# Patient Record
Sex: Female | Born: 1935
Health system: Southern US, Community
[De-identification: ages and names within clinical notes are randomized; demographics above are authoritative.]

## PROBLEM LIST (undated history)

## (undated) DIAGNOSIS — E785 Hyperlipidemia, unspecified: Secondary | ICD-10-CM

## (undated) DIAGNOSIS — R002 Palpitations: Secondary | ICD-10-CM

## (undated) DIAGNOSIS — M810 Age-related osteoporosis without current pathological fracture: Secondary | ICD-10-CM

## (undated) DIAGNOSIS — F419 Anxiety disorder, unspecified: Secondary | ICD-10-CM

## (undated) HISTORY — DX: Hyperlipidemia, unspecified: E78.5

## (undated) HISTORY — PX: OOPHORECTOMY: SHX86

## (undated) HISTORY — DX: Palpitations: R00.2

## (undated) HISTORY — DX: Age-related osteoporosis without current pathological fracture: M81.0

## (undated) HISTORY — PX: EYE SURGERY: SHX253

## (undated) HISTORY — PX: CYSTECTOMY: SUR359

## (undated) HISTORY — DX: Anxiety disorder, unspecified: F41.9

---

## 2002-03-25 ENCOUNTER — Encounter: Payer: Self-pay | Admitting: Gastroenterology

## 2006-05-30 ENCOUNTER — Encounter: Payer: Self-pay | Admitting: Cardiology

## 2009-01-08 ENCOUNTER — Encounter: Payer: Self-pay | Admitting: Cardiology

## 2009-03-04 ENCOUNTER — Encounter: Payer: Self-pay | Admitting: Cardiology

## 2009-07-02 ENCOUNTER — Encounter: Payer: Self-pay | Admitting: Cardiology

## 2009-08-04 ENCOUNTER — Ambulatory Visit: Payer: Self-pay | Admitting: Cardiology

## 2010-01-03 ENCOUNTER — Encounter: Payer: Self-pay | Admitting: Gastroenterology

## 2010-01-05 ENCOUNTER — Encounter: Payer: Self-pay | Admitting: Gastroenterology

## 2010-01-28 DIAGNOSIS — E785 Hyperlipidemia, unspecified: Secondary | ICD-10-CM

## 2010-02-02 ENCOUNTER — Ambulatory Visit: Payer: Self-pay | Admitting: Cardiology

## 2010-02-02 DIAGNOSIS — I080 Rheumatic disorders of both mitral and aortic valves: Secondary | ICD-10-CM

## 2010-02-02 DIAGNOSIS — R002 Palpitations: Secondary | ICD-10-CM | POA: Insufficient documentation

## 2010-05-30 ENCOUNTER — Encounter: Payer: Self-pay | Admitting: Gastroenterology

## 2010-06-08 ENCOUNTER — Encounter: Payer: Self-pay | Admitting: Gastroenterology

## 2010-06-09 ENCOUNTER — Encounter (INDEPENDENT_AMBULATORY_CARE_PROVIDER_SITE_OTHER): Payer: Self-pay | Admitting: *Deleted

## 2010-06-09 ENCOUNTER — Telehealth: Payer: Self-pay | Admitting: Gastroenterology

## 2010-06-09 DIAGNOSIS — R110 Nausea: Secondary | ICD-10-CM | POA: Insufficient documentation

## 2010-06-10 ENCOUNTER — Ambulatory Visit (HOSPITAL_COMMUNITY)
Admission: RE | Admit: 2010-06-10 | Discharge: 2010-06-10 | Payer: Self-pay | Source: Home / Self Care | Admitting: Gastroenterology

## 2010-06-13 ENCOUNTER — Ambulatory Visit: Payer: Self-pay | Admitting: Gastroenterology

## 2010-06-15 ENCOUNTER — Encounter: Payer: Self-pay | Admitting: Gastroenterology

## 2010-06-30 ENCOUNTER — Telehealth: Payer: Self-pay | Admitting: Gastroenterology

## 2010-07-01 ENCOUNTER — Encounter: Admission: RE | Admit: 2010-07-01 | Discharge: 2010-07-01 | Payer: Self-pay | Admitting: Family Medicine

## 2010-07-08 ENCOUNTER — Encounter: Payer: Self-pay | Admitting: Internal Medicine

## 2010-07-09 ENCOUNTER — Encounter: Payer: Self-pay | Admitting: Internal Medicine

## 2010-07-13 ENCOUNTER — Ambulatory Visit: Payer: Self-pay | Admitting: Internal Medicine

## 2010-07-13 DIAGNOSIS — I959 Hypotension, unspecified: Secondary | ICD-10-CM | POA: Insufficient documentation

## 2010-07-27 ENCOUNTER — Ambulatory Visit: Payer: Self-pay | Admitting: Cardiology

## 2010-07-28 ENCOUNTER — Telehealth: Payer: Self-pay | Admitting: Cardiology

## 2010-08-03 ENCOUNTER — Ambulatory Visit: Payer: Self-pay | Admitting: Cardiology

## 2011-01-17 NOTE — Procedures (Signed)
Summary: [facility] Report  Life [facility] Report   Imported By: Erle Crocker 07/22/2010 11:26:14  _____________________________________________________________________  External Attachment:    Type:   Image     Comment:   External Document

## 2011-01-17 NOTE — Letter (Signed)
Summary: Patient [facility] Biopsy Results  [facility]  [address] [place], [place] [address]   Phone: [phone]  Fax: [phone]        June 15, 2010 MRN: [id number]    [facility] [address] [place], [place]  [address]    Dear [name],  I am pleased to inform you that the biopsies taken during your recent endoscopic examination did not show any evidence of cancer upon pathologic examination.  Additional information/recommendations:  __No further action is needed at this time.  Please follow-up with      your primary care physician for your other healthcare needs.  __ Please call [phone] to schedule a return visit to review      your condition.  _X_ Continue with the treatment plan as outlined on the day of your      exam.  __ You should have a repeat endoscopic examination for this problem              in _ months/years.   Please call us if you are having persistent problems or have questions about your condition that have not been fully answered at this time.  Sincerely,  Mardella Layman MD [facility]  This letter has been electronically signed by your physician.  Appended Document: Patient Notice-Endo Biopsy Results letter mailed 7.6.11

## 2011-01-17 NOTE — Procedures (Signed)
Summary: [facility]  [facility]   Imported By: Sherian Rein 06/24/2010 14:10:56  _____________________________________________________________________  External Attachment:    Type:   Image     Comment:   External Document

## 2011-01-17 NOTE — Assessment & Plan Note (Signed)
Summary:  Cardiology   Visit Type:  Follow-up Primary Provider:  Dr. Christell Constant  CC:  palpitations.  History of Present Illness: The H. and presents for followup. Since I last saw her she has had rare palpitations. These are controllable with diltiazem and occasional Xanax. She has had no presyncope or syncope. She has had no chest discomfort, neck or arm discomfort. She denies any shortness of breath, PND or orthopnea. She walks 4 miles daily.   Current Medications (verified): 1)  Diltiazem Hcl 60 Mg Tabs (Diltiazem Hcl) .... 1/2 By Mouth Two Times A Day 2)  Alprazolam 0.25 Mg Tabs (Alprazolam) .... 1/2 By Mouth As Needed 3)  Unisom Sleepgels 50 Mg Caps (Diphenhydramine Hcl (Sleep)) .Marland Kitchen.. 1 By Mouth At Bedtime  Allergies (verified): 1)  ! Penicillin 2)  ! Keflex  Past History:  Past Medical History: Mild hyperlipidemia.  Palpitations  Past Surgical History: Breast cyst resected Oopherectomy Eye surgery.   Review of Systems       As stated in the HPI and negative for all other systems.   Vital Signs:  Patient profile:   76 year old female Height:      64 inches Weight:      118 pounds BMI:     20.33 Pulse rate:   63 / minute Resp:     16 per minute BP sitting:   94 / 56  (right arm)  Vitals Entered By: Marrion Coy, CNA (February 02, 2010 1:02 PM)  Physical Exam  General:  Well developed, well nourished, in no acute distress. Head:  normocephalic and atraumatic Eyes:  PERRLA/EOM intact; conjunctiva and lids normal. Mouth:  Teeth, gums and palate normal. Oral mucosa normal. Neck:  Neck supple, no JVD. No masses, thyromegaly or abnormal cervical nodes. Chest Wall:  no deformities or breast masses noted Lungs:  Clear bilaterally to auscultation and percussion. Heart:  Non-displaced PMI, chest non-tender; regular rate and rhythm, S1, S2 without murmurs, rubs or gallops. Carotid upstroke normal, no bruit. Normal abdominal aortic size, no bruits. Femorals normal  pulses, no bruits. Pedals normal pulses. No edema, no varicosities. Abdomen:  Bowel sounds positive; abdomen soft and non-tender without masses, organomegaly, or hernias noted. No hepatosplenomegaly. Msk:  Back normal, normal gait. Muscle strength and tone normal. Pulses:  pulses normal in all 4 extremities Extremities:  No clubbing or cyanosis. Neurologic:  Alert and oriented x 3. Skin:  Intact without lesions or rashes. Psych:  hyperactive.     EKG  Procedure date:  02/02/2010  Findings:      sinus rhythm, rate 63, axis within normal limits, intervals within normal limits, no acute ST-T wave changes.  Impression & Recommendations:  Problem # 1:  PALPITATIONS (ICD-785.1) These are not particularly problematic at this point. She will continue with the therapies as prescribed. No further workup is suggested.  Orders: EKG w/ Interpretation (93000)  Problem # 2:  HYPERLIPIDEMIA, MILD (ICD-272.4) Per Dr. Christell Constant.  Problem # 3:  MITRAL REGURGITATION (ICD-396.3) The patient had trivial mitral regurgitation on a previous echo. She was concerned about this. I reassured her that this was very unlikely to become clinically hemodynamically significant in the future.  Patient Instructions: 1)  Your physician recommends that you schedule a follow-up appointment in: 6  MONTHS 2)  Your physician recommends that you continue on your current medications as directed. Please refer to the Current Medication list given to you today. 3)  You have been diagnosed with palpitations. Palpitations are described as a  gradual or sudden awareness of the beating of your heart. It may last seconds, minutes, hours, or days and may be caused by the heart beating slower, faster, more strongly, or more irregularly than normal. They are very common and usually not dangerous. Please see the handout/brochure given to you today for more information. Prescriptions: DILTIAZEM HCL 60 MG TABS (DILTIAZEM HCL) 1/2 by mouth two  times a day  #30 x 11   Entered by:   Charolotte Capuchin, RN   Authorized by:   Rollene Rotunda, MD, [facility]   Signed by:   Charolotte Capuchin, RN on 02/02/2010   Method used:   Faxed to ...       [facility]       [address]       [place], [place]  [address]       Ph: [phone] or [phone]       Fax: [phone]   RxID:   [id number]

## 2011-01-17 NOTE — Procedures (Signed)
Summary: [facility]   [facility]   Imported By: Cala Bradford Mesiemore 08/17/2010 10:16:08  _____________________________________________________________________  External Attachment:    Type:   Image     Comment:   External Document

## 2011-01-17 NOTE — Procedures (Signed)
Summary: Upper Endoscopy  Patient: Rose Martinez Note: All result statuses are Final unless otherwise noted.  Tests: (1) Upper Endoscopy (EGD)   EGD Upper Endoscopy       DONE     [facility]     [address]     [place], [place]  [address]           ENDOSCOPY PROCEDURE REPORT           PATIENT:  Rose Martinez, Rose Martinez  MR#:  [id number]     BIRTHDATE:  October 17, 1935, 74 yrs. old  GENDER:  female           ENDOSCOPIST:  Vania Rea. Jarold Motto, MD, [facility]     Referred by:  Rudi Heap, M.D.           PROCEDURE DATE:  06/13/2010     PROCEDURE:  EGD with biopsy     ASA CLASS:  Class II     INDICATIONS:  nausea, weight loss           MEDICATIONS:   Fentanyl 25 mcg IV, Versed 4 mg IV     TOPICAL ANESTHETIC:           DESCRIPTION OF PROCEDURE:   After the risks benefits and     alternatives of the procedure were thoroughly explained, informed     consent was obtained.  The LB GIF-H180 [id number] endoscope was     introduced through the mouth and advanced to the second portion of     the duodenum, without limitations.  The instrument was slowly     withdrawn as the mucosa was fully examined.     <<PROCEDUREIMAGES>>           Normal GE junction was noted.  Normal duodenal folds were noted.     small bowel bx. done  The stomach was entered and closely     examined. The antrum, angularis, and lesser curvature were well     visualized, including a retroflexed view of the cardia and fundus.     The stomach wall was normally distensable. The scope passed easily     through the pylorus into the duodenum. CLO BX DONE FOR H.PYLORI     EXAM.  The esophagus and gastroesophageal junction were completely     normal in appearance.    Retroflexed views revealed no     abnormalities.    The scope was then withdrawn from the patient     and the procedure completed.           COMPLICATIONS:  None           ENDOSCOPIC IMPRESSION:     1) Normal GE junction     2) Normal duodenal folds     3) Normal  stomach     4) Normal esophagus     RECOMMENDATIONS:     1) Rx CLO if positive     2) continue current medications     COLONOSCOPY FOLLOWUP RECOMMENDED.           REPEAT EXAM:  No           ______________________________     Vania Rea. Jarold Motto, MD, [facility]           CC:           n.     eSIGNED:   Vania Rea. Patterson at 06/13/2010 11:25 AM           Renetta Chalk, [phone]  Note:  An exclamation mark (!) indicates a result that was not dispersed into the flowsheet. Document Creation Date: 06/13/2010 11:26 AM _______________________________________________________________________  (1) Order result status: Final Collection or observation date-time: 06/13/2010 11:19 Requested date-time:  Receipt date-time:  Reported date-time:  Referring Physician:   Ordering Physician: Sheryn Bison [phone]) Specimen Source:  Source: Launa Grill Order Number: [id number] Lab site:

## 2011-01-17 NOTE — Miscellaneous (Signed)
Summary: clotest  Clinical Lists Changes  Orders: Added new Test order of TLB-H Pylori Screen Gastric Biopsy (83013-CLOTEST) - Signed 

## 2011-01-17 NOTE — Progress Notes (Signed)
Summary: Triage  Phone Note From Other Clinic   Summary of Call: Dr. Jarold Motto talked with Dr. Christell Constant.  Pt needs to be scheduled for Ultrasound on 06/10/10 and EGD on 06/13/10.  Pt having nausea.  Phone number [phone] Initial call taken by: Ashok Cordia RN,  June 09, 2010 8:40 AM  Follow-up for Phone Call        [place] scheduled for 06/10/10 at 9:00 at [facility].  NPO 6 hrs.  LM for pt to call.  Lupita Leash Surface RN  June 09, 2010 10:43 AM  LM for pt to call.  Lupita Leash Surface RN  June 09, 2010 2:28 PM  Pt notified of appt for [place] on 06/10/10.  Pt sch for EGD on 06/13/10.  She will stop by for instructions in am. Follow-up by: Ashok Cordia RN,  June 09, 2010 3:35 PM  New Problems: NAUSEA (ICD-787.02)   New Problems: NAUSEA (ICD-787.02)

## 2011-01-17 NOTE — Progress Notes (Signed)
Summary: Colonoscopy needed  Phone Note Outgoing Call   Call placed by: Ashok Cordia RN,  June 30, 2010 4:42 PM Summary of Call: Per endoscopy report.  Pt needs to sch colonoscopy.  Tried to call pt.  No answer. Initial call taken by: Ashok Cordia RN,  June 30, 2010 4:42 PM  Follow-up for Phone Call        Called pt re colon.  Female answered phone.  States pt is out of town and he does not know when she will return.  Letter sent asking pt to call and sch Colon as suggested by Dr. Jarold Motto.  (flag sent as a reminder to check on colon) Follow-up by: Ashok Cordia RN,  July 04, 2010 11:21 AM

## 2011-01-17 NOTE — Procedures (Signed)
Summary: Summary Report  Summary Report   Imported By: Erle Crocker 08/05/2010 13:15:11  _____________________________________________________________________  External Attachment:    Type:   Image     Comment:   External Document

## 2011-01-17 NOTE — Letter (Signed)
Summary: EGD Instructions  [facility]  [address] [place], [place] [address]   Phone: [phone]  Fax: [phone]       Rose Martinez    05-16-1935    MRN: [id number]       Procedure Day /Date: Monday, 06/13/10     Arrival Time:  10:00     Procedure Time: 11:00     Location of Procedure:                    [facility] (4th Floor)    PREPARATION FOR ENDOSCOPY   On 06/13/10 THE DAY OF THE PROCEDURE:  1.   No solid foods, milk or milk products are allowed after midnight the night before your procedure.  2.   Do not drink anything colored red or purple.  Avoid juices with pulp.  No orange juice.  3.  You may drink clear liquids until 9:00, which is 2 hours before your procedure.                                                                                                CLEAR LIQUIDS INCLUDE: Water Jello Ice Popsicles Tea (sugar ok, no milk/cream) Powdered fruit flavored drinks Coffee (sugar ok, no milk/cream) Gatorade Juice: apple, white grape, white cranberry  Lemonade Clear bullion, consomm, broth Carbonated beverages (any kind) Strained chicken noodle soup Hard Candy   MEDICATION INSTRUCTIONS  Unless otherwise instructed, you should take regular prescription medications with a small sip of water as early as possible the morning of your procedure.                      OTHER INSTRUCTIONS  You will need a responsible adult at least 74 years of age to accompany you and drive you home.   This person must remain in the waiting room during your procedure.  Wear loose fitting clothing that is easily removed.  Leave jewelry and other valuables at home.  However, you may wish to bring a book to read or an iPod/MP3 player to listen to music as you wait for your procedure to start.  Remove all body piercing jewelry and leave at home.  Total time from sign-in until discharge is approximately 2-3 hours.  You should  go home directly after your procedure and rest.  You can resume normal activities the day after your procedure.  The day of your procedure you should not:   Drive   Make legal decisions   Operate machinery   Drink alcohol   Return to work  You will receive specific instructions about eating, activities and medications before you leave.    The above instructions have been reviewed and explained to me by   _______________________    I fully understand and can verbalize these instructions _____________________________ Date _________

## 2011-01-17 NOTE — Letter (Signed)
[facility]: [facility]   Imported By: Sherian Rein 06/24/2010 14:12:13  _____________________________________________________________________  External Attachment:    Type:   Image     Comment:   External Document

## 2011-01-17 NOTE — Assessment & Plan Note (Signed)
Summary: [facility]   Visit Type:  Follow-up Primary Provider:  Dr. Christell Constant  CC:  Palpitatoins.  History of Present Illness: The patient presents for evaluation of palpitations. She was seen as an add-on by Dr.Bensimhon for complaints of lightheadedness. There was some slightly labile blood pressure. However, it was ultimately thought that her symptoms were related to anxiety. She has since had improvement with less stress though she always appears to be very high strung when I see her. She has palpitations but these are chronic and not particularly bothersome. She's not having any chest pressure, neck or arm discomfort. She is having no new shortness of breath, PND or orthopnea.  Current Medications (verified): 1)  Alprazolam 0.25 Mg Tabs (Alprazolam) .... 1/2 By Mouth As Needed 2)  Unisom Sleepgels 50 Mg Caps (Diphenhydramine Hcl (Sleep)) .... 1/2 Tablet As Needed For Sleep.  Rarely Taking  Allergies (verified): 1)  ! Penicillin 2)  ! Keflex  Past History:  Past Medical History: Reviewed history from 07/13/2010 and no changes required. Mild hyperlipidemia.  Palpitations  --normal echo with trivial  Past Surgical History: Reviewed history from 02/02/2010 and no changes required. Breast cyst resected Oopherectomy Eye surgery.   Review of Systems       As stated in the HPI and negative for all other systems.   Vital Signs:  Patient profile:   76 year old female Height:      64 inches Weight:      113 pounds BMI:     19.47 Pulse rate:   66 / minute Resp:     16 per minute BP sitting:   108 / 60  (right arm)  Vitals Entered By: Marrion Coy, CNA (August 03, 2010 11:46 AM)   Physical Exam  General:  Well developed, well nourished, in no acute distress. Head:  normocephalic and atraumatic Eyes:  PERRLA/EOM intact; conjunctiva and lids normal. Mouth:  Teeth, gums and palate normal. Oral mucosa normal. Neck:  Neck supple, no JVD. No masses, thyromegaly or  abnormal cervical nodes. Chest Wall:  no deformities or breast masses noted Lungs:  Clear bilaterally to auscultation and percussion. Heart:  Non-displaced PMI, chest non-tender; regular rate and rhythm, S1, S2 without murmurs, rubs or gallops. Carotid upstroke normal, no bruit. Normal abdominal aortic size, no bruits. Femorals normal pulses, no bruits. Pedals normal pulses. No edema, no varicosities. Abdomen:  Bowel sounds positive; abdomen soft and non-tender without masses, organomegaly, or hernias noted. No hepatosplenomegaly. Msk:  Back normal, normal gait. Muscle strength and tone normal. Pulses:  pulses normal in all 4 extremities Extremities:  No clubbing or cyanosis. Neurologic:  Alert and oriented x 3. Skin:  Intact without lesions or rashes. Psych:  hyperactive.     Impression & Recommendations:  Problem # 1:  HYPOTENSION, UNSPECIFIED (ICD-458.9) She is not having any new lightheadedness, presyncope or syncope. No change in therapy is indicated.  Problem # 2:  PALPITATIONS (ICD-785.1) Tare baseline and not particularly symptomatic. No change in therapy is planned. I agree that stress plays a role in some of her symptoms.  Patient Instructions: 1)  Your physician recommends that you schedule a follow-up appointment in: 6 months 2)  Your physician recommends that you continue on your current medications as directed. Please refer to the Current Medication list given to you today.

## 2011-01-17 NOTE — Progress Notes (Signed)
Summary: would like to be seen on 8/17 vs 9/7  Phone Note Call from Patient Call back at [facility] Phone [phone]   Caller: Patient Reason for Call: Talk to Nurse Details for Reason: pt didn't relized she had appt on 8/10 wi th jh. her records shows 8/17. pt made appt for 9/7, but would like to be seen on 8/17. please call after 1p.m. today .  Initial call taken by: Lorne Skeens,  July 28, 2010 9:13 AM  Follow-up for Phone Call        appt given for 08/03/2010 at 11:30 Follow-up by: Charolotte Capuchin, RN,  July 28, 2010 9:49 AM

## 2011-01-17 NOTE — Assessment & Plan Note (Signed)
Summary: rov   Primary Provider:  Dr. Christell Constant  CC:  rov. pt sent here by Dr. Christell Constant due to low blood pressure and lightheadedness.  She also report weak legs and unsteady gait.  No appetite here recently.  History of Present Illness: Rose Martinez is a 76 y/o woman with h/u anxiety and palpitations/symptomatic PACs with normal echo. Followed by Dr. Antoine Poche. Referred today by Dr. Christell Constant for acute visit for check on low BP and dizziness.   For past month she has felt lightheaded, nauseated and legs are weak.  No energy.  Anorexic and has lost 10 pounds. Says she is typically very active and walks 3 miles per day but she hasn't been able to do this. Has had extensive work-up with Dr. Christell Constant including head CT, EGD, abdominal CT, event monitor and ambulatory BP monitor. Has also had extensive amount of blood work and saw Dr. Vickey Huger in neurology.  Says she is exasperated. Feels something is really wrong. Had ambulatory BP cuff and BP has ranged 82/39 to 118/62. She is very concerned about her low BP. Still having palpitations. Monitor showed SR with sinus arrhtymia and PACs. Drinks lots of fluids.   Currently going through a divorce which she says is a "happy divorce" and only regret is she didn't do it 40 years ago. Saw Dr. Christell Constant again recently who suggested Cymbalta but she hasn't started yet.   Problems Prior to Update: 1)  Nausea  (ICD-787.02) 2)  Mitral Regurgitation  (ICD-396.3) 3)  Palpitations  (ICD-785.1) 4)  Hyperlipidemia, Mild  (ICD-272.4)  Medications Prior to Update: 1)  Diltiazem Hcl 60 Mg Tabs (Diltiazem Hcl) .... 1/2 By Mouth Two Times A Day 2)  Alprazolam 0.25 Mg Tabs (Alprazolam) .... 1/2 By Mouth As Needed 3)  Unisom Sleepgels 50 Mg Caps (Diphenhydramine Hcl (Sleep)) .Marland Kitchen.. 1 By Mouth At Bedtime  Current Medications (verified): 1)  Alprazolam 0.25 Mg Tabs (Alprazolam) .... 1/2 By Mouth As Needed 2)  Unisom Sleepgels 50 Mg Caps (Diphenhydramine Hcl (Sleep)) .... 1/2 Tablet As Needed  For Sleep.  Rarely Taking  Allergies (verified): 1)  ! Penicillin 2)  ! Keflex  Past History:  Past Surgical History: Last updated: 02/02/2010 Breast cyst resected Oopherectomy Eye surgery.   Family History: Last updated: 01/28/2010  Family history is contributory for father dying of myocardial infarction   suddenly at age 33, otherwise as stated in the HPI.   Social History: Last updated: 07/13/2010  The patient is married. She has one child and two   grandchildren, who live in [place].  She has never really smoked   cigarettes.  She does not drink alcohol.   Past Medical History: Mild hyperlipidemia.  Palpitations  --normal echo with trivial PSH reviewed for relevance  Family History: Reviewed history from 01/28/2010 and no changes required.  Family history is contributory for father dying of myocardial infarction   suddenly at age 20, otherwise as stated in the HPI.   Social History: Reviewed history from 01/28/2010 and no changes required.  The patient is married. She has one child and two   grandchildren, who live in [place].  She has never really smoked   cigarettes.  She does not drink alcohol.   Review of Systems       As per HPI and past medical history; otherwise all systems negative.   Vital Signs:  Patient profile:   76 year old female Height:      64 inches Weight:      116  pounds BMI:     19.98 Pulse rate:   74 / minute Pulse rhythm:   regular BP supine:   98 / 54 BP sitting:   92 / 56  (right arm)  Vitals Entered By: Judithe Modest CMA (July 13, 2010 2:48 PM)  Physical Exam  General:  Gen: well appearing. no resp difficulty. +pressured speech HEENT: normal Neck: supple. no JVD. Carotids 2+ bilat; no bruits. No lymphadenopathy or thryomegaly appreciated. Cor: PMI nondisplaced. Regular rate & rhythm. No rubs, gallops, murmur. Lungs: clear Abdomen: soft, nontender, nondistended. No hepatosplenomegaly. No bruits or masses. Good bowel  sounds. Extremities: no cyanosis, clubbing, rash, edema Neuro: alert & orientedx3, cranial nerves grossly intact. moves all 4 extremities w/o difficulty. affect pleasant    Impression & Recommendations:  Problem # 1:  Weakness We had a very long discussion about her constellation of symptoms and I said to her that I suspect the main problem is likely situational depression. I agreed with Dr. Kathi Der suggestion to start Cymbalta. I also suggested possible counseling.   Problem # 2:  HYPOTENSION, UNSPECIFIED (ICD-458.9) BP is on low side but unchanged from previous. Not significantly orthostatic. Encouraged continued fluid intake. Would not start midodrine or florinef at this time.   Problem # 3:  PALPITATIONS (ICD-785.1) Stable. Monitor with PACs and sinus arrhytmia with occasional brief pause < 2secs. No further w/u needed.   Patient Instructions: 1)  Your physician recommends that you schedule a follow-up appointment as scheduled 2)  Your physician recommends that you continue on your current medications as directed. Please refer to the Current Medication list given to you today.  Prevention & Chronic Care Immunizations   Influenza vaccine: Not documented    Tetanus booster: Not documented    Pneumococcal vaccine: Not documented    H. zoster vaccine: Not documented  Colorectal Screening   Hemoccult: Not documented    Colonoscopy: Not documented  Other Screening   Pap smear: Not documented    Mammogram: Not documented    DXA bone density scan: Not documented   Smoking status: Not documented  Lipids   Total Cholesterol: Not documented   LDL: Not documented   LDL Direct: Not documented   HDL: Not documented   Triglycerides: Not documented    SGOT (AST): Not documented   SGPT (ALT): Not documented   Alkaline phosphatase: Not documented   Total bilirubin: Not documented  Self-Management Support :    Lipid self-management support: Not documented

## 2011-02-15 ENCOUNTER — Encounter: Payer: Self-pay | Admitting: Cardiology

## 2011-02-15 ENCOUNTER — Ambulatory Visit (INDEPENDENT_AMBULATORY_CARE_PROVIDER_SITE_OTHER): Payer: Medicare Other | Admitting: Cardiology

## 2011-02-15 DIAGNOSIS — R002 Palpitations: Secondary | ICD-10-CM

## 2011-02-23 NOTE — Assessment & Plan Note (Signed)
Summary: FOLLOW UP - 6 MONTHS/per pt call=mj   Visit Type:  Follow-up Primary Provider:  Dr. Christell Constant  CC:  Palpitaitons.  History of Present Illness: The patient presents for evaluation of palpiations.  Since I last saw year her she has palpitations daily.  However, she is learning to live with it.  She has had no presyncope or syncope. She has had no chest pressure, neck or arm discomfort she has had no shortness of breath, PND or orthopnea. She is exercising routinely. She has had none of the problem of low blood pressure which he had previously.  Current Medications (verified): 1)  Alprazolam 0.25 Mg Tabs (Alprazolam) .... 1/2 By Mouth As Needed 2)  Unisom Sleepgels 50 Mg Caps (Diphenhydramine Hcl (Sleep)) .... 1/2 Tablet As Needed For Sleep.  Rarely Taking  Allergies (verified): 1)  ! Penicillin 2)  ! Keflex  Past History:  Past Medical History: Mild hyperlipidemia.  Palpitations    Past Surgical History: Reviewed history from 02/02/2010 and no changes required. Breast cyst resected Oopherectomy Eye surgery.   Review of Systems       As stated in the HPI and negative for all other systems.   Vital Signs:  Patient profile:   76 year old female Height:      64 inches Weight:      118 pounds BMI:     20.33 Pulse rate:   54 / minute Resp:     16 per minute BP sitting:   98 / 62  (right arm)  Vitals Entered By: Marrion Coy, CNA (February 15, 2011 2:24 PM)  Physical Exam  General:  Well developed, well nourished, in no acute distress. Head:  normocephalic and atraumatic Neck:  Neck supple, no JVD. No masses, thyromegaly or abnormal cervical nodes. Chest Wall:  no deformities or breast masses noted Lungs:  Clear bilaterally to auscultation and percussion. Heart:  Non-displaced PMI, chest non-tender; regular rate and rhythm, S1, S2 without murmurs, rubs or gallops. Carotid upstroke normal, no bruit. Normal abdominal aortic size, no bruits. Femorals normal pulses, no  bruits. Pedals normal pulses. No edema, no varicosities. Abdomen:  Bowel sounds positive; abdomen soft and non-tender without masses, organomegaly, or hernias noted. No hepatosplenomegaly. Msk:  Back normal, normal gait. Muscle strength and tone normal. Extremities:  No clubbing or cyanosis. Neurologic:  Alert and oriented x 3. Skin:  Intact without lesions or rashes. Psych:  hyperactive.     Impression & Recommendations:  Problem # 1:  PALPITATIONS (ICD-785.1) She has no increased symptoms. Therefore, no change in therapy or further evaluation is indicated.  Problem # 2:  HYPOTENSION, UNSPECIFIED (ICD-458.9) She is not bothered by low blood pressure. I will make no change to her regimen.  Problem # 3:  MITRAL REGURGITATION (ICD-396.3) This was trivial and needs no followup.  Patient Instructions: 1)  Your physician recommends that you schedule a follow-up appointment in: 6 months with Dr Antoine Poche in [place] 2)  Your physician recommends that you continue on your current medications as directed. Please refer to the Current Medication list given to you today.

## 2011-05-02 NOTE — Assessment & Plan Note (Signed)
[facility]                            CARDIOLOGY OFFICE NOTE   ANIESA, BOBACK                         MRN:          [id number]  DATE:08/04/2009                            DOB:          May 02, 1935    PRIMARY CARE PHYSICIAN:  Ernestina Penna, MD   REASON FOR PRESENTATION:  Evaluate the patient with premature atrial  contractions.   HISTORY OF PRESENT ILLNESS:  The patient is 76 years old.  She lives in  [place], but spends most of her time in [place].  In [place], she has  been cared for for years by her primary care physician and a  cardiologist.  She has symptomatic premature atrial contractions.  I was  able to review the notes from her cardiology practice.  She had been  treated predominately with diltiazem over the years.  She says, she has  been intolerant to multiple medications.  I see that Rythmol has been  suggested, but she never started this.  She says, she does relatively  well with the calcium channel blocker.  She will get the palpitations  however, daily.  She describes symptomatic extra beats.  She does not  describe sustained tachyarrhythmias.  She does not have presyncope or  syncope.  She denies any chest pressure, neck or arm discomfort.  She  has had studies to include an echocardiogram in 2008, which demonstrated  no significant cardiac abnormalities.  She had mention of a stress  perfusion study in 2003-2004 that apparently was normal.  She also  reports that she has had treadmill tests routinely and does well with  these.  She does say that she has some dyslipidemia that is managed  conservatively as she has wanted to avoid medications.  Since she is  going to be staying more in [place], she wants to establish with a  cardiologist here as well as a physician in [place].   PAST MEDICAL HISTORY:  Mild hyperlipidemia.   PAST SURGICAL HISTORY:  Breast cyst resected, ovaries removed, eye  surgery.   ALLERGIES:   Questionably to PENICILLIN and KEFLEX.   MEDICATIONS:  1. Diltiazem 30 mg b.i.d.  2. Xanax 0.125 mg as needed.   SOCIAL HISTORY:  The patient is married.  She has one child and two  grandchildren, who live in [place].  She has never really smoked  cigarettes.  She does not drink alcohol.   Family history is contributory for father dying of myocardial infarction  suddenly at age 87, otherwise as stated in the HPI.   REVIEW OF SYSTEMS:  Positive for recent dizziness (she is due to see an  ENT doctor), otherwise negative for all other systems.   PHYSICAL EXAMINATION:  GENERAL:  The patient is pleasant and in no  distress.  VITAL SIGNS:  Blood pressure 110/66, heart rate 67 and regular, weight  124 pounds, body mass index 21.  HEENT:  Eyelids unremarkable.  Pupils equal, round, and reactive to  light.  Fundi not visualized.  Oral mucosa unremarkable.  NECK:  No jugular venous distention at 45 degrees.  Carotid upstroke  brisk and symmetric.  No bruits.  No thyromegaly.  LYMPHATICS:  No cervical, axillary, or inguinal adenopathy.  LUNGS:  Clear to auscultation bilaterally.  BACK:  No costovertebral angle tenderness.  CHEST:  Unremarkable.  HEART:  PMI not displaced or sustained, S1 and S2 within normal limits.  No S3, no S4, no clicks, no rubs, no murmurs.  ABDOMEN:  Flat, positive bowel sounds, normal in frequency and pitch, no  bruits, no rebound, no guarding, no midline pulsatile mass, no  hepatomegaly, no splenomegaly.  SKIN:  No rashes, no nodules.  EXTREMITIES:  2+ pulses throughout, no edema, no cyanosis, no clubbing.  NEUROLOGIC:  Oriented to person, place, and time.  Cranial nerves II  through XII grossly intact.  Motor grossly intact.   EKG:  Sinus bradycardia, rate 70, low voltage in the limb leads, axis  within normal limits, intervals within normal limits, no acute ST-wave  changes.   ASSESSMENT AND PLAN:  1. Premature atrial contractions:  The patient has  symptomatic      premature atrial contractions, but prefers the most conservative      therapy.  At this point, no further change to her regimen is      indicated.  We discussed this at length.  She will let me know if      she has any increasing symptoms going forward.  2. Dyslipidemia:  She would like me to review her lipid profile.  She      would like to avoid statins if possible.  In the absence of any      known obstructive coronary disease, the threshold will be      relatively high to start these though she does have a family      history as her significant risk factor.  I will be happy to review      these when they are available.  3. Mitral regurgitation:  She was concerned about mitral      regurgitation.  Her previous echo demonstrates very trivial mitral      regurgitation and she has no murmur.  No further workup is      suggested.  4. Anxiety:  She continues on Xanax and management per her primary      care doctors.   FOLLOWUP:  She wants to be seen about every 6 months.  We will decide  whether that is here or in [place] or combination most likely.     Rollene Rotunda, MD, [facility]  Electronically Signed    JH/MedQ  DD: 08/04/2009  DT: 08/05/2009  Job #: [id number]   cc:   Ernestina Penna, M.D.

## 2011-07-25 ENCOUNTER — Encounter: Payer: Self-pay | Admitting: Cardiology

## 2011-08-15 ENCOUNTER — Encounter: Payer: Self-pay | Admitting: Cardiology

## 2011-08-16 ENCOUNTER — Encounter: Payer: Self-pay | Admitting: Cardiology

## 2011-08-16 ENCOUNTER — Ambulatory Visit (INDEPENDENT_AMBULATORY_CARE_PROVIDER_SITE_OTHER): Payer: Medicare Other | Admitting: Cardiology

## 2011-08-16 DIAGNOSIS — I080 Rheumatic disorders of both mitral and aortic valves: Secondary | ICD-10-CM

## 2011-08-16 DIAGNOSIS — R002 Palpitations: Secondary | ICD-10-CM

## 2011-08-16 DIAGNOSIS — Z8249 Family history of ischemic heart disease and other diseases of the circulatory system: Secondary | ICD-10-CM

## 2011-08-16 DIAGNOSIS — Z299 Encounter for prophylactic measures, unspecified: Secondary | ICD-10-CM

## 2011-08-16 DIAGNOSIS — R079 Chest pain, unspecified: Secondary | ICD-10-CM

## 2011-08-16 DIAGNOSIS — I959 Hypotension, unspecified: Secondary | ICD-10-CM

## 2011-08-16 NOTE — Assessment & Plan Note (Signed)
This was trivial in the past.  No change in therapy is indicated.  No echo is needed.

## 2011-08-16 NOTE — Assessment & Plan Note (Signed)
These are unchanged.  We talked about them again today and no change in therapy or further testing is indicated.

## 2011-08-16 NOTE — Assessment & Plan Note (Signed)
She would like to have a screening test for CAD and she asked about CT coronary calcium scoring.  I think this would be an excellent test.  I will order this.

## 2011-08-16 NOTE — Progress Notes (Signed)
HPI  Allergies  Allergen Reactions  . Cephalexin   . Penicillins     Current Outpatient Prescriptions  Medication Sig Dispense Refill  . ALPRAZolam (XANAX) 0.25 MG tablet Take 0.125 mg by mouth as needed.        . trimethoprim (TRIMPEX) 100 MG tablet Take 100 mg by mouth as needed.          Past Medical History  Diagnosis Date  . Hyperlipidemia     Mild  . Palpitations     Past Surgical History  Procedure Date  . Cystectomy     Breast  . Oophorectomy   . Eye surgery     ROS: PHYSICAL EXAM BP 88/64  Pulse 56  Resp 16  Ht 5\' 4"  (1.626 m)  Wt 116 lb (52.617 kg)  BMI 19.91 kg/m2 GENERAL:  Well appearing HEENT:  Pupils equal round and reactive, fundi not visualized, oral mucosa unremarkable NECK:  No jugular venous distention, waveform within normal limits, carotid upstroke brisk and symmetric, no bruits, no thyromegaly LYMPHATICS:  No cervical, inguinal adenopathy LUNGS:  Clear to auscultation bilaterally BACK:  No CVA tenderness CHEST:  Unremarkable HEART:  PMI not displaced or sustained,S1 and S2 within normal limits, no S3, no S4, no clicks, no rubs, no murmurs ABD:  Flat, positive bowel sounds normal in frequency in pitch, no bruits, no rebound, no guarding, no midline pulsatile mass, no hepatomegaly, no splenomegaly EXT:  2 plus pulses throughout, no edema, no cyanosis no clubbing SKIN:  No rashes no nodules NEURO:  Cranial nerves II through XII grossly intact, motor grossly intact throughout PSYCH:  Cognitively intact, oriented to person place and time  EKG:  Sinus rhythm, rate 56, axis within normal limits, intervals within normal limits, no acute ST-T wave changes.  ASSESSMENT AND PLAN

## 2011-08-16 NOTE — Assessment & Plan Note (Signed)
She has had no syncope or presyncope.  No change in therapy is indicated.

## 2011-08-16 NOTE — Patient Instructions (Signed)
The current medical regimen is effective;  continue present plan and medications.  Dr Antoine Poche has ordered for you to have a  Cornonary Calcium Score.  We you be contact to schedule this procedure.

## 2011-08-17 DIAGNOSIS — R079 Chest pain, unspecified: Secondary | ICD-10-CM | POA: Insufficient documentation

## 2011-08-30 ENCOUNTER — Encounter: Payer: Self-pay | Admitting: Cardiology

## 2011-08-31 ENCOUNTER — Encounter: Payer: Self-pay | Admitting: Cardiology

## 2011-09-04 ENCOUNTER — Other Ambulatory Visit (HOSPITAL_COMMUNITY): Payer: Medicare Other

## 2012-02-21 ENCOUNTER — Telehealth: Payer: Self-pay | Admitting: Cardiology

## 2012-02-21 NOTE — Telephone Encounter (Signed)
Close  

## 2012-02-28 DIAGNOSIS — R109 Unspecified abdominal pain: Secondary | ICD-10-CM | POA: Diagnosis not present

## 2012-03-04 ENCOUNTER — Encounter: Payer: Self-pay | Admitting: Cardiology

## 2012-03-04 DIAGNOSIS — H5200 Hypermetropia, unspecified eye: Secondary | ICD-10-CM | POA: Diagnosis not present

## 2012-03-04 DIAGNOSIS — R5381 Other malaise: Secondary | ICD-10-CM | POA: Diagnosis not present

## 2012-03-04 DIAGNOSIS — E785 Hyperlipidemia, unspecified: Secondary | ICD-10-CM | POA: Diagnosis not present

## 2012-03-04 DIAGNOSIS — R7989 Other specified abnormal findings of blood chemistry: Secondary | ICD-10-CM | POA: Diagnosis not present

## 2012-03-04 DIAGNOSIS — H2510 Age-related nuclear cataract, unspecified eye: Secondary | ICD-10-CM | POA: Diagnosis not present

## 2012-03-04 DIAGNOSIS — H47099 Other disorders of optic nerve, not elsewhere classified, unspecified eye: Secondary | ICD-10-CM | POA: Diagnosis not present

## 2012-03-04 DIAGNOSIS — H524 Presbyopia: Secondary | ICD-10-CM | POA: Diagnosis not present

## 2012-03-04 DIAGNOSIS — E559 Vitamin D deficiency, unspecified: Secondary | ICD-10-CM | POA: Diagnosis not present

## 2012-03-25 DIAGNOSIS — H35039 Hypertensive retinopathy, unspecified eye: Secondary | ICD-10-CM | POA: Diagnosis not present

## 2012-03-25 DIAGNOSIS — H04129 Dry eye syndrome of unspecified lacrimal gland: Secondary | ICD-10-CM | POA: Diagnosis not present

## 2012-03-25 DIAGNOSIS — H4720 Unspecified optic atrophy: Secondary | ICD-10-CM | POA: Diagnosis not present

## 2012-03-25 DIAGNOSIS — H2510 Age-related nuclear cataract, unspecified eye: Secondary | ICD-10-CM | POA: Diagnosis not present

## 2012-03-27 ENCOUNTER — Encounter: Payer: Self-pay | Admitting: Cardiology

## 2012-03-27 ENCOUNTER — Ambulatory Visit (INDEPENDENT_AMBULATORY_CARE_PROVIDER_SITE_OTHER): Payer: Medicare Other | Admitting: Cardiology

## 2012-03-27 DIAGNOSIS — E785 Hyperlipidemia, unspecified: Secondary | ICD-10-CM

## 2012-03-27 DIAGNOSIS — Z299 Encounter for prophylactic measures, unspecified: Secondary | ICD-10-CM

## 2012-03-27 DIAGNOSIS — R079 Chest pain, unspecified: Secondary | ICD-10-CM | POA: Diagnosis not present

## 2012-03-27 DIAGNOSIS — R002 Palpitations: Secondary | ICD-10-CM

## 2012-03-27 NOTE — Assessment & Plan Note (Signed)
She is having no new symptoms.  No change in therapy is indicated. 

## 2012-03-27 NOTE — Assessment & Plan Note (Signed)
Per Dr. Moore:

## 2012-03-27 NOTE — Patient Instructions (Signed)
The current medical regimen is effective;  continue present plan and medications.  Your physician has requested that you have cardiac calcium score. Cardiac computed tomography (CT) is a painless test that uses an x-ray machine to take clear, detailed pictures of your heart. For further information please visit [email or web address] Please follow instruction sheet as given.  Follow up in 6 months with Dr Antoine Poche.  You will receive a letter in the mail 2 months before you are due.  Please call us when you receive this letter to schedule your follow up appointment.

## 2012-03-27 NOTE — Progress Notes (Signed)
   HPI The patient presents for followup of palpitations. At the last visit we tried to arrange a coronary calcium score but we were unable to do this through the hospital.  She is still interested in having this done. She is also being treated for dyslipidemia but did not tolerate 5 mg of Crestor. She is about to start 2.5 mg daily per Dr. Christell Constant. She continues to have some palpitations but these are not changed. had no presyncope or syncope. She's she is walking 3 miles daily without limitations.  The patient denies any new symptoms such as chest discomfort, neck or arm discomfort. There has been no new shortness of breath, PND or orthopnea. There has been no reported presyncope or syncope.  Allergies  Allergen Reactions  . Cephalexin   . Penicillins     Current Outpatient Prescriptions  Medication Sig Dispense Refill  . ALPRAZolam (XANAX) 0.25 MG tablet Take 0.125 mg by mouth as needed.        . trimethoprim (TRIMPEX) 100 MG tablet Take 100 mg by mouth as needed.          Past Medical History  Diagnosis Date  . Hyperlipidemia     Mild  . Palpitations     Past Surgical History  Procedure Date  . Cystectomy     Breast  . Oophorectomy   . Eye surgery     ROS:  As stated in the HPI and negative for all other systems.   PHYSICAL EXAM BP 110/80  Pulse 51  Ht 5\' 4"  (1.626 m)  Wt 117 lb (53.071 kg)  BMI 20.08 kg/m2 GENERAL:  Well appearing, thin HEENT:  Pupils equal round and reactive, fundi not visualized, oral mucosa unremarkable NECK:  No jugular venous distention, waveform within normal limits, carotid upstroke brisk and symmetric, no bruits, no thyromegaly LYMPHATICS:  No cervical, inguinal adenopathy LUNGS:  Clear to auscultation bilaterally BACK:  No CVA tenderness CHEST:  Unremarkable HEART:  PMI not displaced or sustained,S1 and S2 within normal limits, no S3, no S4, no clicks, no rubs, no murmurs ABD:  Flat, positive bowel sounds normal in frequency in pitch, no  bruits, no rebound, no guarding, no midline pulsatile mass, no hepatomegaly, no splenomegaly EXT:  2 plus pulses throughout, no edema, no cyanosis no clubbing  EKG:  Sinus rhythm, rate 51, axis within normal limits, intervals within normal limits, no acute ST-T wave changes.  03/27/2012   ASSESSMENT AND PLAN

## 2012-03-27 NOTE — Assessment & Plan Note (Signed)
She is still interested in a coronary calcium score and I will arrange this our office.

## 2012-04-02 DIAGNOSIS — D235 Other benign neoplasm of skin of trunk: Secondary | ICD-10-CM | POA: Diagnosis not present

## 2012-04-02 DIAGNOSIS — L608 Other nail disorders: Secondary | ICD-10-CM | POA: Diagnosis not present

## 2012-04-11 DIAGNOSIS — Z1231 Encounter for screening mammogram for malignant neoplasm of breast: Secondary | ICD-10-CM | POA: Diagnosis not present

## 2012-04-17 DIAGNOSIS — M899 Disorder of bone, unspecified: Secondary | ICD-10-CM | POA: Diagnosis not present

## 2012-04-17 DIAGNOSIS — M949 Disorder of cartilage, unspecified: Secondary | ICD-10-CM | POA: Diagnosis not present

## 2012-05-21 DIAGNOSIS — N3000 Acute cystitis without hematuria: Secondary | ICD-10-CM | POA: Diagnosis not present

## 2012-05-22 ENCOUNTER — Ambulatory Visit (INDEPENDENT_AMBULATORY_CARE_PROVIDER_SITE_OTHER)
Admission: RE | Admit: 2012-05-22 | Discharge: 2012-05-22 | Disposition: A | Discharge status: Home / Self Care | Payer: Medicare Other | Source: Ambulatory Visit | Attending: Cardiology | Admitting: Cardiology

## 2012-05-22 DIAGNOSIS — R002 Palpitations: Secondary | ICD-10-CM

## 2012-07-18 DIAGNOSIS — H60399 Other infective otitis externa, unspecified ear: Secondary | ICD-10-CM | POA: Diagnosis not present

## 2012-07-18 DIAGNOSIS — H6120 Impacted cerumen, unspecified ear: Secondary | ICD-10-CM | POA: Diagnosis not present

## 2012-08-26 DIAGNOSIS — E785 Hyperlipidemia, unspecified: Secondary | ICD-10-CM | POA: Diagnosis not present

## 2012-08-26 DIAGNOSIS — J441 Chronic obstructive pulmonary disease with (acute) exacerbation: Secondary | ICD-10-CM | POA: Diagnosis not present

## 2012-08-28 DIAGNOSIS — R5381 Other malaise: Secondary | ICD-10-CM | POA: Diagnosis not present

## 2012-08-28 DIAGNOSIS — E785 Hyperlipidemia, unspecified: Secondary | ICD-10-CM | POA: Diagnosis not present

## 2012-08-28 DIAGNOSIS — R5383 Other fatigue: Secondary | ICD-10-CM | POA: Diagnosis not present

## 2012-08-28 DIAGNOSIS — E559 Vitamin D deficiency, unspecified: Secondary | ICD-10-CM | POA: Diagnosis not present

## 2012-08-28 DIAGNOSIS — Z79899 Other long term (current) drug therapy: Secondary | ICD-10-CM | POA: Diagnosis not present

## 2012-09-27 DIAGNOSIS — Z124 Encounter for screening for malignant neoplasm of cervix: Secondary | ICD-10-CM | POA: Diagnosis not present

## 2012-09-27 DIAGNOSIS — Z01419 Encounter for gynecological examination (general) (routine) without abnormal findings: Secondary | ICD-10-CM | POA: Diagnosis not present

## 2012-10-21 DIAGNOSIS — D235 Other benign neoplasm of skin of trunk: Secondary | ICD-10-CM | POA: Diagnosis not present

## 2012-11-28 DIAGNOSIS — H2510 Age-related nuclear cataract, unspecified eye: Secondary | ICD-10-CM | POA: Diagnosis not present

## 2013-02-06 DIAGNOSIS — G518 Other disorders of facial nerve: Secondary | ICD-10-CM | POA: Diagnosis not present

## 2013-02-24 ENCOUNTER — Encounter: Payer: Self-pay | Admitting: Cardiology

## 2013-02-24 DIAGNOSIS — E785 Hyperlipidemia, unspecified: Secondary | ICD-10-CM | POA: Diagnosis not present

## 2013-02-24 DIAGNOSIS — E559 Vitamin D deficiency, unspecified: Secondary | ICD-10-CM | POA: Diagnosis not present

## 2013-02-24 DIAGNOSIS — Z0000 Encounter for general adult medical examination without abnormal findings: Secondary | ICD-10-CM | POA: Diagnosis not present

## 2013-02-24 DIAGNOSIS — R002 Palpitations: Secondary | ICD-10-CM | POA: Diagnosis not present

## 2013-02-24 DIAGNOSIS — I10 Essential (primary) hypertension: Secondary | ICD-10-CM | POA: Diagnosis not present

## 2013-02-24 DIAGNOSIS — E039 Hypothyroidism, unspecified: Secondary | ICD-10-CM | POA: Diagnosis not present

## 2013-03-05 ENCOUNTER — Ambulatory Visit (INDEPENDENT_AMBULATORY_CARE_PROVIDER_SITE_OTHER): Payer: Medicare Other | Admitting: Cardiology

## 2013-03-05 ENCOUNTER — Encounter: Payer: Self-pay | Admitting: Cardiology

## 2013-03-05 VITALS — BP 108/62 | HR 60 | Ht 65.0 in | Wt 119.0 lb

## 2013-03-05 DIAGNOSIS — I959 Hypotension, unspecified: Secondary | ICD-10-CM

## 2013-03-05 DIAGNOSIS — R002 Palpitations: Secondary | ICD-10-CM | POA: Diagnosis not present

## 2013-03-05 DIAGNOSIS — I080 Rheumatic disorders of both mitral and aortic valves: Secondary | ICD-10-CM

## 2013-03-05 DIAGNOSIS — E785 Hyperlipidemia, unspecified: Secondary | ICD-10-CM | POA: Diagnosis not present

## 2013-03-05 NOTE — Patient Instructions (Addendum)
The current medical regimen is effective;  continue present plan and medications.  Follow up in 6 months with Dr Hochrein.  You will receive a letter in the mail 2 months before you are due.  Please call us when you receive this letter to schedule your follow up appointment.  

## 2013-03-05 NOTE — Progress Notes (Signed)
   HPI The patient presents for followup of palpitations. Since I last saw her she had a coronary calcium score which was zero.  She did have a spell of increasing palpitations but this was when she was drinking caffeinated beverages and he [place] peppermint patties at lunch. Since she stopped taking in caffeine her palpitations are much less frequent. She did have labs TSH and electrolytes. The patient denies any new symptoms such as chest discomfort, neck or arm discomfort. There has been no new shortness of breath, PND or orthopnea. There has been no presyncope or syncope.  Allergies  Allergen Reactions  . Cephalexin   . Penicillins     Current Outpatient Prescriptions  Medication Sig Dispense Refill  . ALPRAZolam (XANAX) 0.25 MG tablet Take 0.125 mg by mouth as needed.        . trimethoprim (TRIMPEX) 100 MG tablet Take 100 mg by mouth as needed.         No current facility-administered medications for this visit.    Past Medical History  Diagnosis Date  . Hyperlipidemia     Mild  . Palpitations     Past Surgical History  Procedure Laterality Date  . Cystectomy      Breast  . Oophorectomy    . Eye surgery      ROS:  As stated in the HPI and negative for all other systems.   PHYSICAL EXAM BP 108/62  Pulse 60  Ht 5\' 5"  (1.651 m)  Wt 119 lb (53.978 kg)  BMI 19.8 kg/m2 GENERAL:  Well appearing, thin NECK:  No jugular venous distention, waveform within normal limits, carotid upstroke brisk and symmetric, no bruits, no thyromegaly LYMPHATICS:  No cervical, inguinal adenopathy LUNGS:  Clear to auscultation bilaterally CHEST:  Unremarkable HEART:  PMI not displaced or sustained,S1 and S2 within normal limits, no S3, no S4, no clicks, no rubs, no murmurs ABD:  Flat, positive bowel sounds normal in frequency in pitch, no bruits, no rebound, no guarding, no midline pulsatile mass, no hepatomegaly, no splenomegaly EXT:  2 plus pulses throughout, no edema, no cyanosis no  clubbing  EKG:  Sinus rhythm, rate 51, axis within normal limits, intervals within normal limits, no acute ST-T wave changes.  03/05/2013  ASSESSMENT AND PLAN  CHEST PAIN:  She's not feeling any chest discomfort. She had a calcium score of 0.  No further testing is indicated.  PALPITATIONS:  She seems to be able to treat this with avoidance of caffeine. No further change in therapy or testing is indicated.

## 2013-03-19 ENCOUNTER — Ambulatory Visit: Payer: Medicare Other | Admitting: Cardiology

## 2013-04-10 ENCOUNTER — Telehealth: Payer: Self-pay | Admitting: Family Medicine

## 2013-04-11 ENCOUNTER — Telehealth: Payer: Self-pay | Admitting: *Deleted

## 2013-04-11 DIAGNOSIS — W57XXXA Bitten or stung by nonvenomous insect and other nonvenomous arthropods, initial encounter: Secondary | ICD-10-CM

## 2013-04-11 MED ORDER — DOXYCYCLINE HYCLATE 100 MG PO TABS: 100.0000 mg | ORAL_TABLET | Freq: Two times a day (BID) | ORAL | Status: DC

## 2013-04-11 NOTE — Telephone Encounter (Signed)
Patient removed what sounds like a deer tick 2 days ago.  She denies any rash or fever.  She is unsure of the length of time it had been attached. She is very concerned about contracting Lyme's Disease.  Discussed with Dr. Christell Constant and he authorized Doxycyline.  Patient aware that it is being sent to the pharmacy.  Made her aware that doxycycline can cause sensitivity to sunlight.  She will call back or schedule an appt if she has any problems or concerns.

## 2013-04-14 NOTE — Telephone Encounter (Signed)
Addressed in another telephone encounter.  Patient was prescribed doxycyline.

## 2013-04-21 DIAGNOSIS — D235 Other benign neoplasm of skin of trunk: Secondary | ICD-10-CM | POA: Diagnosis not present

## 2013-04-21 DIAGNOSIS — L708 Other acne: Secondary | ICD-10-CM | POA: Diagnosis not present

## 2013-04-21 DIAGNOSIS — L918 Other hypertrophic disorders of the skin: Secondary | ICD-10-CM | POA: Diagnosis not present

## 2013-04-21 DIAGNOSIS — L908 Other atrophic disorders of skin: Secondary | ICD-10-CM | POA: Diagnosis not present

## 2013-04-23 ENCOUNTER — Other Ambulatory Visit: Payer: Self-pay

## 2013-04-23 MED ORDER — ALPRAZOLAM 0.25 MG PO TABS: 0.1250 mg | ORAL_TABLET | ORAL | Status: DC | PRN

## 2013-04-23 NOTE — Telephone Encounter (Signed)
Last seen 02/24/13   If approved call in and have nurse notify patient

## 2013-04-24 NOTE — Telephone Encounter (Signed)
RX phoned into pharmacy Pt notified

## 2013-05-07 DIAGNOSIS — Z1231 Encounter for screening mammogram for malignant neoplasm of breast: Secondary | ICD-10-CM | POA: Diagnosis not present

## 2013-05-30 ENCOUNTER — Other Ambulatory Visit: Payer: Self-pay | Admitting: Family Medicine

## 2013-06-05 DIAGNOSIS — H6120 Impacted cerumen, unspecified ear: Secondary | ICD-10-CM | POA: Diagnosis not present

## 2013-07-22 DIAGNOSIS — N3000 Acute cystitis without hematuria: Secondary | ICD-10-CM | POA: Diagnosis not present

## 2013-08-26 ENCOUNTER — Ambulatory Visit: Payer: Self-pay | Admitting: Family Medicine

## 2013-09-03 ENCOUNTER — Encounter: Payer: Self-pay | Admitting: Family Medicine

## 2013-09-03 ENCOUNTER — Ambulatory Visit (INDEPENDENT_AMBULATORY_CARE_PROVIDER_SITE_OTHER): Payer: Medicare Other | Admitting: Family Medicine

## 2013-09-03 VITALS — BP 103/63 | HR 56 | Temp 97.4°F | Ht 65.0 in | Wt 118.0 lb

## 2013-09-03 DIAGNOSIS — E559 Vitamin D deficiency, unspecified: Secondary | ICD-10-CM | POA: Diagnosis not present

## 2013-09-03 DIAGNOSIS — E785 Hyperlipidemia, unspecified: Secondary | ICD-10-CM

## 2013-09-03 DIAGNOSIS — I080 Rheumatic disorders of both mitral and aortic valves: Secondary | ICD-10-CM | POA: Diagnosis not present

## 2013-09-03 DIAGNOSIS — I959 Hypotension, unspecified: Secondary | ICD-10-CM | POA: Diagnosis not present

## 2013-09-03 DIAGNOSIS — N390 Urinary tract infection, site not specified: Secondary | ICD-10-CM | POA: Insufficient documentation

## 2013-09-03 DIAGNOSIS — I8390 Asymptomatic varicose veins of unspecified lower extremity: Secondary | ICD-10-CM

## 2013-09-03 LAB — POCT UA - MICROSCOPIC ONLY
Bacteria, U Microscopic: NEGATIVE
Yeast, UA: NEGATIVE

## 2013-09-03 LAB — POCT URINALYSIS DIPSTICK
Glucose, UA: NEGATIVE
Nitrite, UA: NEGATIVE
Protein, UA: NEGATIVE
Urobilinogen, UA: NEGATIVE
pH, UA: 5

## 2013-09-03 NOTE — Progress Notes (Signed)
  Subjective:    Patient ID: Rose Martinez, female    DOB: 1935-05-17, 78 y.o.   MRN: [id number]  HPI Patient comes in today for followup and management of chronic medical problems. These problems include hyperlipidemia, osteopenia, anxiety, and palpitations. She sees the cardiologist for her palpitation problems and she also follows with a urologist for recurrent urinary tract infections.   Review of Systems  Constitutional: Negative.   HENT: Negative.   Eyes: Negative.   Respiratory: Negative.   Cardiovascular: Negative.   Gastrointestinal: Negative.   Endocrine: Negative.   Genitourinary: Negative.   Musculoskeletal: Negative.   Skin: Positive for color change.       verucose veins and knot behind right leg  Allergic/Immunologic: Negative.   Neurological: Negative.   Hematological: Negative.   Psychiatric/Behavioral: Negative.        Objective:   Physical Exam BP 103/63  Pulse 56  Temp(Src) 97.4 F (36.3 C) (Oral)  Ht 5\' 5"  (1.651 m)  Wt 118 lb (53.524 kg)  BMI 19.64 kg/m2  The patient appeared well nourished and normally developed, alert and oriented to time and place. Speech, behavior and judgement appear normal. Vital signs as documented.  Head exam is unremarkable. No scleral icterus or pallor noted. Ears nose and throat all within normal limits. Neck is without jugular venous distension, thyromegally, or carotid bruits. Carotid upstrokes are brisk bilaterally. No cervical adenopathy. Lungs are clear anteriorly and posteriorly to auscultation. Normal respiratory effort. Cardiac exam reveals regular rate and rhythm at 60 per minute. First and second heart sounds normal.  No murmurs, rubs or gallops.  Abdominal exam reveals normal bowl sounds, no masses, no organomegaly and no aortic enlargement. No inguinal adenopathy. There was no abdominal tenderness. Extremities are nonedematous and both femoral and pedal pulses are normal. She has prominent varicosities in  the right posterior calf. There was some slight tenderness with flexion of toes of right foot. Skin without pallor or jaundice.  Warm and dry, without rash. Neurologic exam reveals normal deep tendon reflexes and normal sensation.          Assessment & Plan:  1. HYPERLIPIDEMIA, MILD - POCT CBC; Future - Hepatic function panel; Future - NMR, lipoprofile; Future  2. Hypotension, unspecified - POCT CBC; Future - BMP8+EGFR; Future - NMR, lipoprofile; Future  3. Unspecified vitamin D deficiency  4. MITRAL REGURGITATION  5. Recurrent urinary tract infection - POCT urinalysis dipstick - POCT UA - Microscopic Only  6. Varicose vein of leg, right -try thigh length support hose  Patient Instructions  Always be careful and do not put yourself at risk for falling If you take flu shots,it will be due in October Continue current medications Keep followup appointments with the urologist and the cardiologist Continue aggressive therapeutic lifestyle changes Continue drinking plenty of fluids Get above the knee support hose    Nyra Capes MD

## 2013-09-03 NOTE — Patient Instructions (Signed)
Always be careful and do not put yourself at risk for falling If you take flu shots,it will be due in October Continue current medications Keep followup appointments with the urologist and the cardiologist Continue aggressive therapeutic lifestyle changes Continue drinking plenty of fluids Get above the knee support hose

## 2013-09-04 ENCOUNTER — Other Ambulatory Visit (INDEPENDENT_AMBULATORY_CARE_PROVIDER_SITE_OTHER): Payer: Medicare Other

## 2013-09-04 DIAGNOSIS — E785 Hyperlipidemia, unspecified: Secondary | ICD-10-CM | POA: Diagnosis not present

## 2013-09-04 DIAGNOSIS — I959 Hypotension, unspecified: Secondary | ICD-10-CM | POA: Diagnosis not present

## 2013-09-04 LAB — POCT CBC
Granulocyte percent: 36.1 %G — AB (ref 37–80)
HCT, POC: 39.8 % (ref 37.7–47.9)
Lymph, poc: 1.4 (ref 0.6–3.4)
MCH, POC: 32.9 pg — AB (ref 27–31.2)
MCV: 97.5 fL — AB (ref 80–97)
MPV: 11 fL (ref 0–99.8)
POC Granulocyte: 2.2 (ref 2–6.9)
POC LYMPH PERCENT: 36.1 %L (ref 10–50)

## 2013-09-04 NOTE — Progress Notes (Signed)
Patient came in for labs only.

## 2013-09-05 LAB — NMR, LIPOPROFILE
Small LDL Particle Number: 185 nmol/L (ref <=527)
Triglycerides by NMR: 75 mg/dL (ref <150)

## 2013-09-05 LAB — BMP8+EGFR
BUN/Creatinine Ratio: 18 (ref 11–26)
BUN: 14 mg/dL (ref 8–27)
CO2: 27 mmol/L (ref 18–29)
Calcium: 9.5 mg/dL (ref 8.6–10.2)
Chloride: 105 mmol/L (ref 97–108)
GFR calc non Af Amer: 75 mL/min/{1.73_m2} (ref >59)

## 2013-09-05 LAB — HEPATIC FUNCTION PANEL
ALT: 15 IU/L (ref 0–32)
AST: 16 IU/L (ref 0–40)
Albumin: 4.3 g/dL (ref 3.5–4.8)
Alkaline Phosphatase: 70 IU/L (ref 39–117)
Total Bilirubin: 0.5 mg/dL (ref 0.0–1.2)

## 2013-09-10 ENCOUNTER — Ambulatory Visit (INDEPENDENT_AMBULATORY_CARE_PROVIDER_SITE_OTHER): Payer: Medicare Other | Admitting: Cardiology

## 2013-09-10 ENCOUNTER — Encounter: Payer: Self-pay | Admitting: Cardiology

## 2013-09-10 DIAGNOSIS — I959 Hypotension, unspecified: Secondary | ICD-10-CM

## 2013-09-10 DIAGNOSIS — R002 Palpitations: Secondary | ICD-10-CM

## 2013-09-10 DIAGNOSIS — R079 Chest pain, unspecified: Secondary | ICD-10-CM

## 2013-09-10 DIAGNOSIS — I080 Rheumatic disorders of both mitral and aortic valves: Secondary | ICD-10-CM | POA: Diagnosis not present

## 2013-09-10 NOTE — Patient Instructions (Addendum)
The current medical regimen is effective;  continue present plan and medications.  Follow up in 6 months with Dr Hochrein.  You will receive a letter in the mail 2 months before you are due.  Please call us when you receive this letter to schedule your follow up appointment.  

## 2013-09-10 NOTE — Progress Notes (Signed)
   HPI The patient presents for followup of palpitations. Since I last saw her she has had no new cardiovascular complaints.  The patient denies any new symptoms such as chest discomfort, neck or arm discomfort. There has been no new shortness of breath, PND or orthopnea. There have been no reported palpitations, presyncope or syncope.  She walks daily.   Allergies  Allergen Reactions  . Cephalexin   . Penicillins     Current Outpatient Prescriptions  Medication Sig Dispense Refill  . ALPRAZolam (XANAX) 0.25 MG tablet Take 0.5 tablets (0.125 mg total) by mouth as needed.  30 tablet  0  . fluticasone (FLONASE) 50 MCG/ACT nasal spray Place 2 sprays into the nose daily.      . Probiotic Product (ALIGN PO) Take 1 capsule by mouth daily.      Marland Kitchen trimethoprim (TRIMPEX) 100 MG tablet TAKE 1 TABLET AT BEDTIME OR POST COITALLY  30 tablet  0   No current facility-administered medications for this visit.    Past Medical History  Diagnosis Date  . Hyperlipidemia     Mild  . Palpitations   . Anxiety   . Osteopenia     Past Surgical History  Procedure Laterality Date  . Cystectomy      Breast  . Oophorectomy    . Eye surgery      ROS:  As stated in the HPI and negative for all other systems.   PHYSICAL EXAM BP 111/63  Pulse 60  Ht 5\' 5"  (1.651 m)  Wt 119 lb (53.978 kg)  BMI 19.8 kg/m2 GENERAL:  Well appearing, thin NECK:  No jugular venous distention, waveform within normal limits, carotid upstroke brisk and symmetric, no bruits, no thyromegaly LUNGS:  Clear to auscultation bilaterally CHEST:  Unremarkable HEART:  PMI not displaced or sustained,S1 and S2 within normal limits, no S3, no S4, no clicks, no rubs, no murmurs ABD:  Flat, positive bowel sounds normal in frequency in pitch, no bruits, no rebound, no guarding, no midline pulsatile mass, no hepatomegaly, no splenomegaly EXT:  2 plus pulses throughout, no edema, no cyanosis no clubbing  EKG:  Sinus rhythm, rate 55, axis  within normal limits, intervals within normal limits, no acute ST-T wave changes.  09/10/2013  ASSESSMENT AND PLAN  CHEST PAIN:  She's not feeling any chest discomfort. She had a calcium score of 0.  No further testing is indicated.  PALPITATIONS:  She is not particularly bothered by these.  No change in therapy or further studies are needed.

## 2013-09-29 ENCOUNTER — Telehealth: Payer: Self-pay | Admitting: Family Medicine

## 2013-09-29 NOTE — Telephone Encounter (Signed)
She needs to talk to Dr. Kathi Der Nurse ASAP

## 2013-09-30 ENCOUNTER — Other Ambulatory Visit (INDEPENDENT_AMBULATORY_CARE_PROVIDER_SITE_OTHER): Payer: Medicare Other

## 2013-09-30 DIAGNOSIS — R7989 Other specified abnormal findings of blood chemistry: Secondary | ICD-10-CM | POA: Diagnosis not present

## 2013-09-30 NOTE — Telephone Encounter (Signed)
Pt aware per dwm to use ibu 1 tab tid after meals for buttock pain from recent fall and if no better by next week to see him.

## 2013-10-01 LAB — BMP8+EGFR
BUN/Creatinine Ratio: 23 (ref 11–26)
BUN: 13 mg/dL (ref 8–27)
CO2: 26 mmol/L (ref 18–29)
Chloride: 102 mmol/L (ref 97–108)
Creatinine, Ser: 0.56 mg/dL — ABNORMAL LOW (ref 0.57–1.00)
GFR calc non Af Amer: 92 mL/min/{1.73_m2} (ref >59)
Sodium: 143 mmol/L (ref 134–144)

## 2013-10-07 ENCOUNTER — Other Ambulatory Visit: Payer: Self-pay | Admitting: *Deleted

## 2013-10-07 ENCOUNTER — Telehealth: Payer: Self-pay | Admitting: Family Medicine

## 2013-10-07 ENCOUNTER — Ambulatory Visit (INDEPENDENT_AMBULATORY_CARE_PROVIDER_SITE_OTHER): Payer: Medicare Other | Admitting: Family Medicine

## 2013-10-07 ENCOUNTER — Other Ambulatory Visit (INDEPENDENT_AMBULATORY_CARE_PROVIDER_SITE_OTHER): Payer: Medicare Other

## 2013-10-07 ENCOUNTER — Encounter: Payer: Self-pay | Admitting: Family Medicine

## 2013-10-07 VITALS — BP 103/55 | HR 68 | Temp 97.7°F | Ht 65.0 in | Wt 118.0 lb

## 2013-10-07 DIAGNOSIS — E875 Hyperkalemia: Secondary | ICD-10-CM | POA: Diagnosis not present

## 2013-10-07 DIAGNOSIS — J309 Allergic rhinitis, unspecified: Secondary | ICD-10-CM

## 2013-10-07 DIAGNOSIS — J329 Chronic sinusitis, unspecified: Secondary | ICD-10-CM | POA: Diagnosis not present

## 2013-10-07 MED ORDER — AZITHROMYCIN 250 MG PO TABS: ORAL_TABLET | ORAL | Status: DC

## 2013-10-07 NOTE — Patient Instructions (Addendum)

## 2013-10-07 NOTE — Progress Notes (Signed)
Subjective:    Patient ID: Rose Martinez, female    DOB: [birth year], 78 y.o.   MRN: [id number]  HPI Patient here today sinus problems. This has been going on for 2-3 weeks. She has not been using her fluticasone nose spray on a regular basis. She denies fever. She denies cough. She feels a little bit wobbly with walking. Her vital signs are stable.   Patient Active Problem List   Diagnosis Date Noted  . Recurrent urinary tract infection 09/03/2013  . Chest pain 08/17/2011  . Preventive measure 08/16/2011  . HYPOTENSION, UNSPECIFIED 07/13/2010  . NAUSEA 06/09/2010  . MITRAL REGURGITATION 02/02/2010  . PALPITATIONS 02/02/2010  . HYPERLIPIDEMIA, MILD 01/28/2010   Outpatient Encounter Prescriptions as of 10/07/2013  Medication Sig Dispense Refill  . ALPRAZolam (XANAX) 0.25 MG tablet Take 0.5 tablets (0.125 mg total) by mouth as needed.  30 tablet  0  . fluticasone (FLONASE) 50 MCG/ACT nasal spray Place 2 sprays into the nose daily.      . Probiotic Product (ALIGN PO) Take 1 capsule by mouth daily.      Marland Kitchen trimethoprim (TRIMPEX) 100 MG tablet TAKE 1 TABLET AT BEDTIME OR POST COITALLY  30 tablet  0   No facility-administered encounter medications on file as of 10/07/2013.    Review of Systems  Constitutional: Positive for fatigue. Negative for fever.  HENT: Positive for sinus pressure. Negative for sore throat.   Eyes: Negative.   Respiratory: Negative.   Cardiovascular: Negative.   Gastrointestinal: Negative.   Endocrine: Negative.   Genitourinary: Negative.   Musculoskeletal: Negative.   Skin: Negative.   Allergic/Immunologic: Negative.   Neurological: Positive for light-headedness (head is in "a fog").  Hematological: Negative.   Psychiatric/Behavioral: Negative.        Objective:   Physical Exam  Nursing note and vitals reviewed. Constitutional: She is oriented to person, place, and time. She appears well-developed and well-nourished. No distress.  HENT:  Head:  Normocephalic and atraumatic.  Right Ear: External ear normal.  Left Ear: External ear normal.  Nose: Nose normal.  Mouth/Throat: Oropharynx is clear and moist. No oropharyngeal exudate.  Minimal sinus tenderness or pressure  Eyes: Conjunctivae and EOM are normal. Right eye exhibits no discharge. Left eye exhibits no discharge. No scleral icterus.  Neck: Normal range of motion. Neck supple. No thyromegaly present.  Cardiovascular: Normal rate, regular rhythm and normal heart sounds.  Exam reveals no gallop and no friction rub.   Pulmonary/Chest: Effort normal and breath sounds normal. No respiratory distress. She has no wheezes. She has no rales. She exhibits no tenderness.  Musculoskeletal: Normal range of motion.  Neurological: She is alert and oriented to person, place, and time.  Skin: Skin is warm and dry. No rash noted.  Psychiatric: She has a normal mood and affect. Her behavior is normal. Judgment and thought content normal.   BP 103/55  Pulse 68  Temp(Src) 97.7 F (36.5 C) (Oral)  Ht 5\' 5"  (1.651 m)  Wt 118 lb (53.524 kg)  BMI 19.64 kg/m2        Assessment & Plan:    1. Allergic rhinitis   2. Sinusitis    No orders of the defined types were placed in this encounter.   Meds ordered this encounter  Medications  . azithromycin (ZITHROMAX) 250 MG tablet    Sig: 2 pills the first day then one daily until completed    Dispense:  6 tablet    Refill:  0  Patient Instructions  Sinusitis Sinusitis is redness, soreness, and swelling (inflammation) of the paranasal sinuses. Paranasal sinuses are air pockets within the bones of your face (beneath the eyes, the middle of the forehead, or above the eyes). In healthy paranasal sinuses, mucus is able to drain out, and air is able to circulate through them by way of your nose. However, when your paranasal sinuses are inflamed, mucus and air can become trapped. This can allow bacteria and other germs to grow and cause  infection. Sinusitis can develop quickly and last only a short time (acute) or continue over a long period (chronic). Sinusitis that lasts for more than 12 weeks is considered chronic.  CAUSES  Causes of sinusitis include:  Allergies.  Structural abnormalities, such as displacement of the cartilage that separates your nostrils (deviated septum), which can decrease the air flow through your nose and sinuses and affect sinus drainage.  Functional abnormalities, such as when the small hairs (cilia) that line your sinuses and help remove mucus do not work properly or are not present. SYMPTOMS  Symptoms of acute and chronic sinusitis are the same. The primary symptoms are pain and pressure around the affected sinuses. Other symptoms include:  Upper toothache.  Earache.  Headache.  Bad breath.  Decreased sense of smell and taste.  A cough, which worsens when you are lying flat.  Fatigue.  Fever.  Thick drainage from your nose, which often is green and may contain pus (purulent).  Swelling and warmth over the affected sinuses. DIAGNOSIS  Your caregiver will perform a physical exam. During the exam, your caregiver may:  Look in your nose for signs of abnormal growths in your nostrils (nasal polyps).  Tap over the affected sinus to check for signs of infection.  View the inside of your sinuses (endoscopy) with a special imaging device with a light attached (endoscope), which is inserted into your sinuses. If your caregiver suspects that you have chronic sinusitis, one or more of the following tests may be recommended:  Allergy tests.  Nasal culture A sample of mucus is taken from your nose and sent to a lab and screened for bacteria.  Nasal cytology A sample of mucus is taken from your nose and examined by your caregiver to determine if your sinusitis is related to an allergy. TREATMENT  Most cases of acute sinusitis are related to a viral infection and will resolve on their  own within 10 days. Sometimes medicines are prescribed to help relieve symptoms (pain medicine, decongestants, nasal steroid sprays, or saline sprays).  However, for sinusitis related to a bacterial infection, your caregiver will prescribe antibiotic medicines. These are medicines that will help kill the bacteria causing the infection.  Rarely, sinusitis is caused by a fungal infection. In theses cases, your caregiver will prescribe antifungal medicine. For some cases of chronic sinusitis, surgery is needed. Generally, these are cases in which sinusitis recurs more than 3 times per year, despite other treatments. HOME CARE INSTRUCTIONS   Drink plenty of water. Water helps thin the mucus so your sinuses can drain more easily.  Use a humidifier.  Inhale steam 3 to 4 times a day (for example, sit in the bathroom with the shower running).  Apply a warm, moist washcloth to your face 3 to 4 times a day, or as directed by your caregiver.  Use saline nasal sprays to help moisten and clean your sinuses.  Take over-the-counter or prescription medicines for pain, discomfort, or fever only as directed by your  caregiver. SEEK IMMEDIATE MEDICAL CARE IF:  You have increasing pain or severe headaches.  You have nausea, vomiting, or drowsiness.  You have swelling around your face.  You have vision problems.  You have a stiff neck.  You have difficulty breathing. MAKE SURE YOU:   Understand these instructions.  Will watch your condition.  Will get help right away if you are not doing well or get worse. Document Released: 12/04/2005 Document Revised: 02/26/2012 Document Reviewed: 12/19/2011 [facility] Patient Information 2014 [place], [place].  Drink plenty of fluids Use Dymista sample as directed 1 spray each nostril at bedtime, when this is completed the back on fluticasone Take antibiotic as directed Take Tylenol for aches pains and fever.   Nyra Capes MD

## 2013-10-07 NOTE — Telephone Encounter (Signed)
Pt came to office today.

## 2013-10-08 LAB — BMP8+EGFR
BUN/Creatinine Ratio: 21 (ref 11–26)
BUN: 13 mg/dL (ref 8–27)
CO2: 27 mmol/L (ref 18–29)
Calcium: 9.5 mg/dL (ref 8.6–10.2)
Creatinine, Ser: 0.61 mg/dL (ref 0.57–1.00)
GFR calc non Af Amer: 89 mL/min/{1.73_m2} (ref >59)
Sodium: 141 mmol/L (ref 134–144)

## 2013-10-09 DIAGNOSIS — H47239 Glaucomatous optic atrophy, unspecified eye: Secondary | ICD-10-CM | POA: Diagnosis not present

## 2013-10-09 DIAGNOSIS — H353190 Nonexudative age-related macular degeneration, unspecified eye, stage unspecified: Secondary | ICD-10-CM | POA: Diagnosis not present

## 2013-10-09 DIAGNOSIS — H2510 Age-related nuclear cataract, unspecified eye: Secondary | ICD-10-CM | POA: Diagnosis not present

## 2013-10-09 DIAGNOSIS — H35729 Serous detachment of retinal pigment epithelium, unspecified eye: Secondary | ICD-10-CM | POA: Diagnosis not present

## 2013-10-09 DIAGNOSIS — H4720 Unspecified optic atrophy: Secondary | ICD-10-CM | POA: Diagnosis not present

## 2013-12-27 ENCOUNTER — Other Ambulatory Visit: Payer: Self-pay | Admitting: Family Medicine

## 2013-12-27 ENCOUNTER — Other Ambulatory Visit: Payer: Self-pay | Admitting: *Deleted

## 2013-12-27 MED ORDER — ALPRAZOLAM 0.25 MG PO TABS: 0.1250 mg | ORAL_TABLET | ORAL | Status: DC | PRN

## 2014-01-05 ENCOUNTER — Other Ambulatory Visit: Payer: Self-pay | Admitting: Family Medicine

## 2014-02-05 ENCOUNTER — Telehealth: Payer: Self-pay | Admitting: Family Medicine

## 2014-02-05 NOTE — Telephone Encounter (Signed)
Patient really wants to only speak to you about something she said you know all about it and she would really just like to speak to you

## 2014-02-09 NOTE — Telephone Encounter (Signed)
appt has been scheduled for pts

## 2014-02-18 ENCOUNTER — Encounter: Payer: Self-pay | Admitting: Cardiology

## 2014-02-18 ENCOUNTER — Ambulatory Visit (INDEPENDENT_AMBULATORY_CARE_PROVIDER_SITE_OTHER): Payer: Medicare Other | Admitting: Cardiology

## 2014-02-18 VITALS — BP 99/62 | HR 63 | Ht 65.0 in | Wt 120.0 lb

## 2014-02-18 DIAGNOSIS — I959 Hypotension, unspecified: Secondary | ICD-10-CM | POA: Diagnosis not present

## 2014-02-18 DIAGNOSIS — R002 Palpitations: Secondary | ICD-10-CM | POA: Diagnosis not present

## 2014-02-18 NOTE — Patient Instructions (Signed)
The current medical regimen is effective;  continue present plan and medications.  Follow up in 6 months with Dr Hochrein.  You will receive a letter in the mail 2 months before you are due.  Please call us when you receive this letter to schedule your follow up appointment.  

## 2014-02-18 NOTE — Progress Notes (Signed)
    HPI The patient presents for followup of palpitations. Since I last saw her she has had no new cardiovascular complaints.  She had some fleeting chest pain yesterday.  The patient denies any new symptoms such as c neck or arm discomfort. There has been no new shortness of breath, PND or orthopnea. There have been no reported increase in palpitations and no presyncope or syncope.  She walks daily 3 miles     Allergies  Allergen Reactions  . Cephalexin   . Penicillins     Current Outpatient Prescriptions  Medication Sig Dispense Refill  . ALPRAZolam (XANAX) 0.25 MG tablet Take 0.5 tablets (0.125 mg total) by mouth as needed.  30 tablet  5  . fluticasone (FLONASE) 50 MCG/ACT nasal spray SPRAY 1 SPRAY IN EACH NOSTRIL ONCE DAILY.  16 g  3  . Probiotic Product (ALIGN PO) Take 1 capsule by mouth daily.      Marland Kitchen trimethoprim (TRIMPEX) 100 MG tablet TAKE 1 TABLET AT BEDTIME OR POST COITALLY  30 tablet  0   No current facility-administered medications for this visit.    Past Medical History  Diagnosis Date  . Hyperlipidemia     Mild  . Palpitations   . Anxiety   . Osteopenia     Past Surgical History  Procedure Laterality Date  . Cystectomy      Breast  . Oophorectomy    . Eye surgery      ROS:  As stated in the HPI and negative for all other systems.  02/18/2014   PHYSICAL EXAM BP 99/62  Pulse 63  Ht 5\' 5"  (1.651 m)  Wt 120 lb (54.432 kg)  BMI 19.97 kg/m2 GENERAL:  Well appearing, thin NECK:  No jugular venous distention, waveform within normal limits, carotid upstroke brisk and symmetric, no bruits, no thyromegaly LUNGS:  Clear to auscultation bilaterally HEART:  PMI not displaced or sustained,S1 and S2 within normal limits, no S3, no S4, no clicks, no rubs, no murmurs ABD:  Flat, positive bowel sounds normal in frequency in pitch, no bruits, no rebound, no guarding, no midline pulsatile mass, no hepatomegaly, no splenomegaly EXT:  2 plus pulses throughout, no edema, no  cyanosis no clubbing  EKG:  Sinus rhythm, rate 58, axis within normal limits, intervals within normal limits, no acute ST-T wave changes.  02/18/2014  ASSESSMENT AND PLAN  CHEST PAIN:  The pain she describes is atypical. She had a calcium score of 0.  No further testing is indicated.  PALPITATIONS:  She is not particularly bothered by these.  No change in therapy or further studies are needed.

## 2014-03-04 ENCOUNTER — Ambulatory Visit: Payer: Medicare Other | Admitting: Family Medicine

## 2014-03-12 DIAGNOSIS — H4720 Unspecified optic atrophy: Secondary | ICD-10-CM | POA: Diagnosis not present

## 2014-03-12 DIAGNOSIS — H2510 Age-related nuclear cataract, unspecified eye: Secondary | ICD-10-CM | POA: Diagnosis not present

## 2014-04-20 DIAGNOSIS — L918 Other hypertrophic disorders of the skin: Secondary | ICD-10-CM | POA: Diagnosis not present

## 2014-04-20 DIAGNOSIS — D485 Neoplasm of uncertain behavior of skin: Secondary | ICD-10-CM | POA: Diagnosis not present

## 2014-04-20 DIAGNOSIS — D1801 Hemangioma of skin and subcutaneous tissue: Secondary | ICD-10-CM | POA: Diagnosis not present

## 2014-04-20 DIAGNOSIS — L570 Actinic keratosis: Secondary | ICD-10-CM | POA: Diagnosis not present

## 2014-04-20 DIAGNOSIS — L908 Other atrophic disorders of skin: Secondary | ICD-10-CM | POA: Diagnosis not present

## 2014-04-20 DIAGNOSIS — L821 Other seborrheic keratosis: Secondary | ICD-10-CM | POA: Diagnosis not present

## 2014-04-20 DIAGNOSIS — L819 Disorder of pigmentation, unspecified: Secondary | ICD-10-CM | POA: Diagnosis not present

## 2014-04-20 DIAGNOSIS — L723 Sebaceous cyst: Secondary | ICD-10-CM | POA: Diagnosis not present

## 2014-04-20 DIAGNOSIS — L851 Acquired keratosis [keratoderma] palmaris et plantaris: Secondary | ICD-10-CM | POA: Diagnosis not present

## 2014-04-20 DIAGNOSIS — L820 Inflamed seborrheic keratosis: Secondary | ICD-10-CM | POA: Diagnosis not present

## 2014-05-04 ENCOUNTER — Encounter: Payer: Self-pay | Admitting: Family Medicine

## 2014-05-04 ENCOUNTER — Ambulatory Visit (INDEPENDENT_AMBULATORY_CARE_PROVIDER_SITE_OTHER): Payer: Medicare Other | Admitting: Family Medicine

## 2014-05-04 VITALS — BP 112/65 | HR 62 | Temp 98.7°F | Ht 65.0 in | Wt 117.0 lb

## 2014-05-04 DIAGNOSIS — E559 Vitamin D deficiency, unspecified: Secondary | ICD-10-CM | POA: Diagnosis not present

## 2014-05-04 DIAGNOSIS — Z87440 Personal history of urinary (tract) infections: Secondary | ICD-10-CM | POA: Diagnosis not present

## 2014-05-04 DIAGNOSIS — H61899 Other specified disorders of external ear, unspecified ear: Secondary | ICD-10-CM

## 2014-05-04 DIAGNOSIS — E785 Hyperlipidemia, unspecified: Secondary | ICD-10-CM

## 2014-05-04 DIAGNOSIS — Z13820 Encounter for screening for osteoporosis: Secondary | ICD-10-CM

## 2014-05-04 DIAGNOSIS — I080 Rheumatic disorders of both mitral and aortic valves: Secondary | ICD-10-CM

## 2014-05-04 LAB — POCT URINALYSIS DIPSTICK
Bilirubin, UA: NEGATIVE
Blood, UA: NEGATIVE
Glucose, UA: NEGATIVE
Ketones, UA: NEGATIVE
LEUKOCYTES UA: NEGATIVE
NITRITE UA: NEGATIVE
PH UA: 7
Protein, UA: NEGATIVE
Spec Grav, UA: 1.01
Urobilinogen, UA: NEGATIVE

## 2014-05-04 LAB — POCT UA - MICROSCOPIC ONLY
Bacteria, U Microscopic: NEGATIVE
CASTS, UR, LPF, POC: NEGATIVE
Crystals, Ur, HPF, POC: NEGATIVE
Yeast, UA: NEGATIVE

## 2014-05-04 NOTE — Patient Instructions (Addendum)
Medicare Annual Wellness Visit  [facility] and the medical providers at [facility] strive to bring you the best medical care.  In doing so we not only want to address your current medical conditions and concerns but also to detect new conditions early and prevent illness, disease and health-related problems.    Medicare offers a yearly Wellness Visit which allows our clinical staff to assess your need for preventative services including immunizations, lifestyle education, counseling to decrease risk of preventable diseases and screening for fall risk and other medical concerns.    This visit is provided free of charge (no copay) for all Medicare recipients. The clinical pharmacists at [facility] have begun to conduct these Wellness Visits which will also include a thorough review of all your medications.    As you primary medical provider recommend that you make an appointment for your Annual Wellness Visit if you have not done so already this year.  You may set up this appointment before you leave today or you may call back ([phone]) and schedule an appointment.  Please make sure when you call that you mention that you are scheduling your Annual Wellness Visit with the clinical pharmacist so that the appointment may be made for the proper length of time.      Continue current medications. Continue good therapeutic lifestyle changes which include good diet and exercise. Fall precautions discussed with patient. If an FOBT was given today- please return it to our front desk. If you are over 34 years old - you may need Prevnar 19 or the adult Pneumonia vaccine.  Return  the FOBT Return to clinic in 2 days for fasting lab work We will schedule you for a DEXA scan soon Keep the appointment with the cardiologist as planned Remember to protect your ears from hairspray and heat If they itch a lot, you may use some cortisone 10  over-the-counter 2 or 3 times weekly in the ear canal

## 2014-05-04 NOTE — Progress Notes (Signed)
Subjective:    Patient ID: Rose Martinez, female    DOB: 10-23-1935, 79 y.o.   MRN: [id number]  HPI Pt here for follow up and management of chronic medical problems. The patient has a history of urinary tract infections. [place] FOBT at home which he is due to return. She is to come back and get her lab work when she is fasting. She will be scheduled for a DEXA scan which is reviewed. She does complain of some itching in her ears and she complains of some discomfort on the right side of her neck. She sees a cardiologist every 6 months.       Patient Active Problem List   Diagnosis Date Noted  . Recurrent urinary tract infection 09/03/2013  . Chest pain 08/17/2011  . Preventive measure 08/16/2011  . HYPOTENSION, UNSPECIFIED 07/13/2010  . NAUSEA 06/09/2010  . MITRAL REGURGITATION 02/02/2010  . PALPITATIONS 02/02/2010  . HYPERLIPIDEMIA, MILD 01/28/2010   Outpatient Encounter Prescriptions as of 05/04/2014  Medication Sig  . ALPRAZolam (XANAX) 0.25 MG tablet Take 0.5 tablets (0.125 mg total) by mouth as needed.  . fluticasone (FLONASE) 50 MCG/ACT nasal spray SPRAY 1 SPRAY IN EACH NOSTRIL ONCE DAILY.  . Probiotic Product (ALIGN PO) Take 1 capsule by mouth daily.  Marland Kitchen trimethoprim (TRIMPEX) 100 MG tablet TAKE 1 TABLET AT BEDTIME OR POST COITALLY    Review of Systems  Constitutional: Negative.   HENT: Negative.        Ears feel itchy at times  Check right side of throat  Eyes: Negative.   Respiratory: Negative.   Cardiovascular: Negative.   Gastrointestinal: Negative.   Endocrine: Negative.   Genitourinary: Negative.   Musculoskeletal: Negative.   Skin: Negative.   Allergic/Immunologic: Negative.   Neurological: Negative.   Hematological: Negative.   Psychiatric/Behavioral: Negative.        Objective:   Physical Exam  Nursing note and vitals reviewed. Constitutional: She is oriented to person, place, and time. She appears well-developed and well-nourished.  Slim,  pleasant and younger looking than her stated age of 24   HENT:  Head: Normocephalic and atraumatic.  Right Ear: External ear normal.  Left Ear: External ear normal.  Nose: Nose normal.  Mouth/Throat: Oropharynx is clear and moist. No oropharyngeal exudate.  Eyes: Conjunctivae and EOM are normal. Pupils are equal, round, and reactive to light. Right eye exhibits no discharge. Left eye exhibits no discharge. No scleral icterus.  Neck: Normal range of motion. Neck supple. No JVD present. No thyromegaly present.  There is no lymphadenopathy masses or thyroid enlargement  Cardiovascular: Normal rate, regular rhythm, normal heart sounds and intact distal pulses.  Exam reveals no gallop and no friction rub.   No murmur heard. At 72 per minute  Pulmonary/Chest: Effort normal and breath sounds normal. No respiratory distress. She has no wheezes. She has no rales. She exhibits no tenderness.  Abdominal: Soft. Bowel sounds are normal. She exhibits no mass. There is no tenderness. There is no rebound and no guarding.  Musculoskeletal: Normal range of motion. She exhibits no edema and no tenderness.  Lymphadenopathy:    She has no cervical adenopathy.  Neurological: She is alert and oriented to person, place, and time. She has normal reflexes. No cranial nerve deficit.  Skin: Skin is warm and dry. No rash noted.  Psychiatric: She has a normal mood and affect. Her behavior is normal. Judgment and thought content normal.   BP 112/65  Pulse 62  Temp(Src) 98.7  F (37.1 C) (Oral)  Ht 5' 5"  (1.651 m)  Wt 117 lb (53.071 kg)  BMI 19.47 kg/m2        Assessment & Plan:  1. HYPERLIPIDEMIA, MILD - POCT CBC; Future - BMP8+EGFR; Future - Hepatic function panel; Future - NMR, lipoprofile; Future  2. Screening for osteoporosis - DG Bone Density; Future - Vit D  25 hydroxy (rtn osteoporosis monitoring); Future  3. Vitamin D deficiency - Vit D  25 hydroxy (rtn osteoporosis monitoring); Future  4.  History of UTI - POCT UA - Microscopic Only - POCT urinalysis dipstick  5. Ear canal dryness  6. Mitral valve insufficiency and aortic valve insufficiency -Continued followup with a cardiologist Patient Instructions                       Medicare Annual Wellness Visit  [facility] and the medical providers at [facility] strive to bring you the best medical care.  In doing so we not only want to address your current medical conditions and concerns but also to detect new conditions early and prevent illness, disease and health-related problems.    Medicare offers a yearly Wellness Visit which allows our clinical staff to assess your need for preventative services including immunizations, lifestyle education, counseling to decrease risk of preventable diseases and screening for fall risk and other medical concerns.    This visit is provided free of charge (no copay) for all Medicare recipients. The clinical pharmacists at [facility] have begun to conduct these Wellness Visits which will also include a thorough review of all your medications.    As you primary medical provider recommend that you make an appointment for your Annual Wellness Visit if you have not done so already this year.  You may set up this appointment before you leave today or you may call back ([phone]) and schedule an appointment.  Please make sure when you call that you mention that you are scheduling your Annual Wellness Visit with the clinical pharmacist so that the appointment may be made for the proper length of time.      Continue current medications. Continue good therapeutic lifestyle changes which include good diet and exercise. Fall precautions discussed with patient. If an FOBT was given today- please return it to our front desk. If you are over 73 years old - you may need Prevnar 25 or the adult Pneumonia vaccine.  Return  the FOBT Return to clinic in 2 days for  fasting lab work We will schedule you for a DEXA scan soon Keep the appointment with the cardiologist as planned Remember to protect your ears from hairspray and heat If they itch a lot, you may use some cortisone 10 over-the-counter 2 or 3 times weekly in the ear canal   Arrie Senate MD

## 2014-05-06 ENCOUNTER — Other Ambulatory Visit (INDEPENDENT_AMBULATORY_CARE_PROVIDER_SITE_OTHER): Payer: Medicare Other

## 2014-05-06 DIAGNOSIS — E785 Hyperlipidemia, unspecified: Secondary | ICD-10-CM | POA: Diagnosis not present

## 2014-05-06 DIAGNOSIS — E559 Vitamin D deficiency, unspecified: Secondary | ICD-10-CM | POA: Diagnosis not present

## 2014-05-06 DIAGNOSIS — Z13820 Encounter for screening for osteoporosis: Secondary | ICD-10-CM

## 2014-05-06 DIAGNOSIS — N390 Urinary tract infection, site not specified: Secondary | ICD-10-CM | POA: Diagnosis not present

## 2014-05-06 LAB — POCT CBC
Granulocyte percent: 61.1 %G (ref 37–80)
HEMATOCRIT: 41 % (ref 37.7–47.9)
HEMOGLOBIN: 13.4 g/dL (ref 12.2–16.2)
Lymph, poc: 1.5 (ref 0.6–3.4)
MCH: 32.8 pg — AB (ref 27–31.2)
MCHC: 32.7 g/dL (ref 31.8–35.4)
MCV: 100.3 fL — AB (ref 80–97)
MPV: 12.2 fL (ref 0–99.8)
POC Granulocyte: 2.6 (ref 2–6.9)
POC LYMPH %: 35.1 % (ref 10–50)
Platelet Count, POC: 130 10*3/uL — AB (ref 142–424)
RBC: 4.1 M/uL (ref 4.04–5.48)
RDW, POC: 12.7 %
WBC: 4.3 10*3/uL — AB (ref 4.6–10.2)

## 2014-05-06 NOTE — Progress Notes (Signed)
Pt came in for labs only 

## 2014-05-07 LAB — NMR, LIPOPROFILE
Cholesterol: 214 mg/dL — ABNORMAL HIGH (ref 100–199)
HDL Cholesterol by NMR: 73 mg/dL (ref >39)
HDL Particle Number: 34.3 umol/L (ref >=30.5)
LDL PARTICLE NUMBER: 1463 nmol/L — AB (ref <1000)
LDL SIZE: 21.4 nm (ref >20.5)
LDLC SERPL CALC-MCNC: 126 mg/dL — ABNORMAL HIGH (ref 0–99)
Small LDL Particle Number: 200 nmol/L (ref <=527)
Triglycerides by NMR: 73 mg/dL (ref 0–149)

## 2014-05-07 LAB — BMP8+EGFR
BUN/Creatinine Ratio: 24 (ref 11–26)
BUN: 18 mg/dL (ref 8–27)
CO2: 24 mmol/L (ref 18–29)
Calcium: 9.3 mg/dL (ref 8.7–10.3)
Chloride: 106 mmol/L (ref 97–108)
Creatinine, Ser: 0.74 mg/dL (ref 0.57–1.00)
GFR calc non Af Amer: 79 mL/min/{1.73_m2} (ref >59)
GFR, EST AFRICAN AMERICAN: 91 mL/min/{1.73_m2} (ref >59)
Glucose: 89 mg/dL (ref 65–99)
POTASSIUM: 4.8 mmol/L (ref 3.5–5.2)
SODIUM: 145 mmol/L — AB (ref 134–144)

## 2014-05-07 LAB — HEPATIC FUNCTION PANEL
ALK PHOS: 70 IU/L (ref 39–117)
ALT: 9 IU/L (ref 0–32)
AST: 13 IU/L (ref 0–40)
Albumin: 4.5 g/dL (ref 3.5–4.8)
Bilirubin, Direct: 0.11 mg/dL (ref 0.00–0.40)
Total Bilirubin: 0.5 mg/dL (ref 0.0–1.2)
Total Protein: 6.8 g/dL (ref 6.0–8.5)

## 2014-05-07 LAB — VITAMIN D 25 HYDROXY (VIT D DEFICIENCY, FRACTURES): VIT D 25 HYDROXY: 33.6 ng/mL (ref 30.0–100.0)

## 2014-06-01 DIAGNOSIS — H6120 Impacted cerumen, unspecified ear: Secondary | ICD-10-CM | POA: Diagnosis not present

## 2014-06-22 ENCOUNTER — Ambulatory Visit (INDEPENDENT_AMBULATORY_CARE_PROVIDER_SITE_OTHER): Payer: Medicare Other | Admitting: Pharmacist

## 2014-06-22 ENCOUNTER — Encounter: Payer: Self-pay | Admitting: Pharmacist

## 2014-06-22 VITALS — BP 110/68 | HR 70 | Ht 65.0 in | Wt 116.0 lb

## 2014-06-22 DIAGNOSIS — E785 Hyperlipidemia, unspecified: Secondary | ICD-10-CM

## 2014-06-22 NOTE — Progress Notes (Signed)
Lipid Clinic Consultation  Chief Complaint:   Chief Complaint  Patient presents with  . Hyperlipidemia     HPI:  Patient has been seen previously and discussed dietary and TLC changes to improve lipids.  She has tried crestor in patient but caused leg pain. She does have a family history of her father having MI at 77yo She does not smoke and does not have HTN or take antihypertensive medications.  Diet - lots of vegetables and fruit.  No friend foods, no cheese.  Drinks almond/coconut milk.   Component 05/06/2014 09/04/2013   CHOL 215 233   LDL 126 144   LDL-P 1463 1945   HDL 73 74   TRIG 73 75    Assessment: CHD/CHF Risk Equivalents:  none AHA80yrs risk:  14.3% NCEP Risk Factors Present:  family history and age Primary Problem(s):  LDL or LDL-P elevated  Current NCEP Goals: LDL Goal < 100 HDL Goal >/= 40 Tg Goal < 150 Non-HDL Goal < 130  Secondary cause of hyperlipidemia present:  none Low fat diet followed?  Yes -   Low carb diet followed?  Yes -   Exercise?  Yes -    Filed Vitals:   06/22/14 1035  BP: 110/68  Pulse: 70   Filed Weights   06/22/14 1035  Weight: 116 lb (52.617 kg)   Body mass index is 19.3 kg/(m^2).   Assessment Elevated LDL but improved and patient's AHA estimated risk and age does not recommend pharmacotherapy  Recommendations: Reviewed low fat / mediterranean diet Continue with regular exercist Recheck Lipid Panel:  6 months   Cherre Robins, PharmD, CPP

## 2014-06-24 DIAGNOSIS — H6120 Impacted cerumen, unspecified ear: Secondary | ICD-10-CM | POA: Diagnosis not present

## 2014-07-07 ENCOUNTER — Ambulatory Visit (INDEPENDENT_AMBULATORY_CARE_PROVIDER_SITE_OTHER): Payer: Medicare Other | Admitting: Family Medicine

## 2014-07-07 ENCOUNTER — Encounter: Payer: Self-pay | Admitting: Family Medicine

## 2014-07-07 VITALS — BP 98/62 | HR 66 | Temp 97.8°F | Ht 65.0 in | Wt 117.0 lb

## 2014-07-07 DIAGNOSIS — I83893 Varicose veins of bilateral lower extremities with other complications: Secondary | ICD-10-CM | POA: Diagnosis not present

## 2014-07-07 NOTE — Progress Notes (Signed)
   Subjective:    Patient ID: Rose Martinez, female    DOB: 06-06-1935, 79 y.o.   MRN: [id number]  HPI Patient here today for right ankle pain and bruising. The area is not red or does not present with heat. The patient does notice this last night before she went to bed. There is no history of any injury      Patient Active Problem List   Diagnosis Date Noted  . Recurrent urinary tract infection 09/03/2013  . Chest pain 08/17/2011  . Preventive measure 08/16/2011  . HYPOTENSION, UNSPECIFIED 07/13/2010  . NAUSEA 06/09/2010  . MITRAL REGURGITATION 02/02/2010  . PALPITATIONS 02/02/2010  . HYPERLIPIDEMIA, MILD 01/28/2010   Outpatient Encounter Prescriptions as of 07/07/2014  Medication Sig  . ALPRAZolam (XANAX) 0.25 MG tablet Take 0.5 tablets (0.125 mg total) by mouth as needed.  . cholecalciferol (VITAMIN D) 1000 UNITS tablet Take 1,000 Units by mouth daily.  . fluticasone (FLONASE) 50 MCG/ACT nasal spray SPRAY 1 SPRAY IN EACH NOSTRIL ONCE DAILY.  . Probiotic Product (ALIGN PO) Take 1 capsule by mouth daily.  Marland Kitchen trimethoprim (TRIMPEX) 100 MG tablet TAKE 1 TABLET AT BEDTIME OR POST COITALLY    Review of Systems  Constitutional: Negative.   HENT: Negative.   Eyes: Negative.   Respiratory: Negative.   Cardiovascular: Negative.   Gastrointestinal: Negative.   Endocrine: Negative.   Genitourinary: Negative.   Musculoskeletal: Negative.   Skin: Positive for color change (pain and bruise right - top of ankle).  Allergic/Immunologic: Negative.   Neurological: Negative.   Hematological: Negative.   Psychiatric/Behavioral: Negative.        Objective:   Physical Exam  Nursing note and vitals reviewed. Constitutional: She is oriented to person, place, and time. She appears well-developed and well-nourished.  HENT:  Head: Normocephalic and atraumatic.  Eyes: Conjunctivae and EOM are normal. Pupils are equal, round, and reactive to light. Right eye exhibits no discharge.    Neck: Normal range of motion.  Cardiovascular: Intact distal pulses.   There is a small knot near a superficial vein in the right anterior ankle. There is no redness. There is no tenderness.  Musculoskeletal: Normal range of motion. She exhibits no edema and no tenderness.  Neurological: She is alert and oriented to person, place, and time.  Skin: Skin is warm and dry. No rash noted. No erythema.  Psychiatric: She has a normal mood and affect. Her behavior is normal. Judgment and thought content normal.   BP 98/62  Pulse 66  Temp(Src) 97.8 F (36.6 C) (Oral)  Ht 5\' 5"  (1.651 m)  Wt 117 lb (53.071 kg)  BMI 19.47 kg/m2        Assessment & Plan:  1. Varicose veins of lower extremities with other complications  Patient Instructions  Take a coated baby Asprin twice a day after a meal  Use warm wet compressions 20 min - several times per day Elevated ankle as much as possible as well   Call in 2 weeks for progress  Arrie Senate MD

## 2014-07-07 NOTE — Patient Instructions (Signed)
Take a coated baby Asprin twice a day after a meal  Use warm wet compressions 20 min - several times per day Elevated ankle as much as possible as well

## 2014-07-22 ENCOUNTER — Ambulatory Visit: Payer: Medicare Other

## 2014-07-22 ENCOUNTER — Other Ambulatory Visit: Payer: Medicare Other

## 2014-08-06 DIAGNOSIS — L908 Other atrophic disorders of skin: Secondary | ICD-10-CM | POA: Diagnosis not present

## 2014-08-06 DIAGNOSIS — L820 Inflamed seborrheic keratosis: Secondary | ICD-10-CM | POA: Diagnosis not present

## 2014-08-06 DIAGNOSIS — L918 Other hypertrophic disorders of the skin: Secondary | ICD-10-CM | POA: Diagnosis not present

## 2014-08-06 DIAGNOSIS — D239 Other benign neoplasm of skin, unspecified: Secondary | ICD-10-CM | POA: Diagnosis not present

## 2014-08-06 DIAGNOSIS — L819 Disorder of pigmentation, unspecified: Secondary | ICD-10-CM | POA: Diagnosis not present

## 2014-08-06 DIAGNOSIS — D485 Neoplasm of uncertain behavior of skin: Secondary | ICD-10-CM | POA: Diagnosis not present

## 2014-08-06 DIAGNOSIS — L570 Actinic keratosis: Secondary | ICD-10-CM | POA: Diagnosis not present

## 2014-08-06 DIAGNOSIS — L821 Other seborrheic keratosis: Secondary | ICD-10-CM | POA: Diagnosis not present

## 2014-08-18 DIAGNOSIS — N3000 Acute cystitis without hematuria: Secondary | ICD-10-CM | POA: Diagnosis not present

## 2014-08-20 ENCOUNTER — Telehealth: Payer: Self-pay | Admitting: Family Medicine

## 2014-08-20 NOTE — Telephone Encounter (Signed)
Wants to discuss another physician with you - wants to come in to discuss it

## 2014-08-25 ENCOUNTER — Ambulatory Visit: Payer: Medicare Other | Admitting: Family Medicine

## 2014-09-03 ENCOUNTER — Other Ambulatory Visit: Payer: Self-pay | Admitting: Family Medicine

## 2014-09-04 NOTE — Telephone Encounter (Signed)
This is okay to refill 

## 2014-09-04 NOTE — Telephone Encounter (Signed)
Patient last seen in office on 07-07-14. Rx last filled on 03-16-14. Please advise on refill. If approved please route to Pool A so nurse can phone in to pharmacy

## 2014-09-07 NOTE — Telephone Encounter (Signed)
Called into pharmacy

## 2014-09-18 ENCOUNTER — Encounter: Payer: Self-pay | Admitting: Gastroenterology

## 2014-09-30 ENCOUNTER — Ambulatory Visit: Payer: Medicare Other | Admitting: Cardiology

## 2014-10-12 DIAGNOSIS — H2513 Age-related nuclear cataract, bilateral: Secondary | ICD-10-CM | POA: Diagnosis not present

## 2014-10-12 DIAGNOSIS — H44512 Absolute glaucoma, left eye: Secondary | ICD-10-CM | POA: Diagnosis not present

## 2014-10-12 DIAGNOSIS — H47232 Glaucomatous optic atrophy, left eye: Secondary | ICD-10-CM | POA: Diagnosis not present

## 2014-10-20 ENCOUNTER — Ambulatory Visit: Payer: Medicare Other | Admitting: Cardiology

## 2014-10-23 DIAGNOSIS — L812 Freckles: Secondary | ICD-10-CM | POA: Diagnosis not present

## 2014-10-23 DIAGNOSIS — L814 Other melanin hyperpigmentation: Secondary | ICD-10-CM | POA: Diagnosis not present

## 2014-10-23 DIAGNOSIS — L821 Other seborrheic keratosis: Secondary | ICD-10-CM | POA: Diagnosis not present

## 2014-10-23 DIAGNOSIS — L905 Scar conditions and fibrosis of skin: Secondary | ICD-10-CM | POA: Diagnosis not present

## 2014-11-04 ENCOUNTER — Encounter: Payer: Self-pay | Admitting: Family Medicine

## 2014-11-04 ENCOUNTER — Ambulatory Visit (INDEPENDENT_AMBULATORY_CARE_PROVIDER_SITE_OTHER): Payer: Medicare Other | Admitting: Family Medicine

## 2014-11-04 ENCOUNTER — Ambulatory Visit (INDEPENDENT_AMBULATORY_CARE_PROVIDER_SITE_OTHER): Payer: Medicare Other

## 2014-11-04 VITALS — BP 97/51 | HR 52 | Temp 97.3°F | Ht 65.0 in | Wt 115.0 lb

## 2014-11-04 DIAGNOSIS — M79661 Pain in right lower leg: Secondary | ICD-10-CM

## 2014-11-04 DIAGNOSIS — Z13820 Encounter for screening for osteoporosis: Secondary | ICD-10-CM

## 2014-11-04 DIAGNOSIS — E785 Hyperlipidemia, unspecified: Secondary | ICD-10-CM

## 2014-11-04 DIAGNOSIS — E559 Vitamin D deficiency, unspecified: Secondary | ICD-10-CM

## 2014-11-04 DIAGNOSIS — Z0000 Encounter for general adult medical examination without abnormal findings: Secondary | ICD-10-CM

## 2014-11-04 DIAGNOSIS — M79609 Pain in unspecified limb: Secondary | ICD-10-CM | POA: Diagnosis not present

## 2014-11-04 NOTE — Progress Notes (Signed)
Subjective:    Patient ID: Rose Martinez, female    DOB: [birth year], 79 y.o.   MRN: [id number]  HPI Pt here for follow up and management of chronic medical problems. The patient is having some arthralgias and her right lower leg, her right great toe and right foot. She continues to be followed by the urologist and the cardiologist. She will schedule her pelvic exam and mammogram. She will be scheduled for a DEXA scan, she will get a chest x-ray, bring in FOBT and will get lab work today. She refuses to take the flu shot or the Prevnar vaccine.      Patient Active Problem List   Diagnosis Date Noted  . Recurrent urinary tract infection 09/03/2013  . Chest pain 08/17/2011  . Preventive measure 08/16/2011  . HYPOTENSION, UNSPECIFIED 07/13/2010  . NAUSEA 06/09/2010  . MITRAL REGURGITATION 02/02/2010  . PALPITATIONS 02/02/2010  . HYPERLIPIDEMIA, MILD 01/28/2010   Outpatient Encounter Prescriptions as of 11/04/2014  Medication Sig  . ALPRAZolam (XANAX) 0.25 MG tablet One half tablet 3 times daily as needed  . cholecalciferol (VITAMIN D) 1000 UNITS tablet Take 1,000 Units by mouth daily.  . fluticasone (FLONASE) 50 MCG/ACT nasal spray SPRAY 1 SPRAY IN EACH NOSTRIL ONCE DAILY.  . Probiotic Product (ALIGN PO) Take 1 capsule by mouth daily.  Marland Kitchen trimethoprim (TRIMPEX) 100 MG tablet TAKE 1 TABLET AT BEDTIME OR POST COITALLY    Review of Systems  Constitutional: Negative.   HENT: Negative.   Eyes: Negative.   Respiratory: Negative.   Cardiovascular: Negative.   Gastrointestinal: Negative.   Endocrine: Negative.   Genitourinary: Negative.   Musculoskeletal: Positive for myalgias (right lower leg ) and arthralgias (right great toe and some right foot pain ).  Skin: Negative.   Allergic/Immunologic: Negative.   Neurological: Negative.   Hematological: Negative.   Psychiatric/Behavioral: Negative.        Objective:   Physical Exam  Constitutional: She is oriented to person,  place, and time. She appears well-developed and well-nourished. No distress.  HENT:  Head: Normocephalic and atraumatic.  Right Ear: External ear normal.  Left Ear: External ear normal.  Nose: Nose normal.  Mouth/Throat: Oropharynx is clear and moist.  Eyes: Conjunctivae and EOM are normal. Pupils are equal, round, and reactive to light. Right eye exhibits no discharge. Left eye exhibits no discharge. No scleral icterus.  Neck: Normal range of motion. Neck supple. No JVD present. No thyromegaly present.  No carotid bruits or anterior cervical adenopathy  Cardiovascular: Normal rate, regular rhythm, normal heart sounds and intact distal pulses.   No murmur heard. The heart has a regular rate and rhythm at 72/m  Pulmonary/Chest: Effort normal and breath sounds normal. No respiratory distress. She has no wheezes. She has no rales. She exhibits no tenderness.  Abdominal: Soft. Bowel sounds are normal. She exhibits no mass. There is no tenderness. There is no rebound and no guarding.  Musculoskeletal: Normal range of motion. She exhibits no edema or tenderness.  Leg raising is good bilaterally and hip abduction is good bilaterally without pain.  Lymphadenopathy:    She has no cervical adenopathy.  Neurological: She is alert and oriented to person, place, and time. She has normal reflexes.  Skin: Skin is warm and dry. No rash noted.  Psychiatric: She has a normal mood and affect. Her behavior is normal. Judgment and thought content normal.  Nursing note and vitals reviewed.  BP 97/51 mmHg  Pulse 52  Temp(Src) 97.3  F (36.3 C) (Oral)  Ht 5' 5"  (1.651 m)  Wt 115 lb (52.164 kg)  BMI 19.14 kg/m2  [facility] reading (PRIMARY) by  Dr. Brunilda Payor x-ray--  No active disease                                      Assessment & Plan:  1. Hyperlipidemia - BMP8+EGFR - Hepatic function panel - NMR, lipoprofile - DG Chest 2 View; Future  2. Vitamin D deficiency - DG Bone Density; Future - Vit D   25 hydroxy (rtn osteoporosis monitoring)  3. Screening for osteoporosis - DG Bone Density; Future - CBC With differential/Platelet  4. Vitamin D deficiency - DG Bone Density; Future - Vit D  25 hydroxy (rtn osteoporosis monitoring)  5. Health care maintenance - DG Chest 2 View; Future  6. Pain of right lower leg - Uric acid - CBC With differential/Platelet  Patient Instructions                       Medicare Annual Wellness Visit  [facility] and the medical providers at [facility] strive to bring you the best medical care.  In doing so we not only want to address your current medical conditions and concerns but also to detect new conditions early and prevent illness, disease and health-related problems.    Medicare offers a yearly Wellness Visit which allows our clinical staff to assess your need for preventative services including immunizations, lifestyle education, counseling to decrease risk of preventable diseases and screening for fall risk and other medical concerns.    This visit is provided free of charge (no copay) for all Medicare recipients. The clinical pharmacists at [facility] have begun to conduct these Wellness Visits which will also include a thorough review of all your medications.    As you primary medical provider recommend that you make an appointment for your Annual Wellness Visit if you have not done so already this year.  You may set up this appointment before you leave today or you may call back [phone]) and schedule an appointment.  Please make sure when you call that you mention that you are scheduling your Annual Wellness Visit with the clinical pharmacist so that the appointment may be made for the proper length of time.     Continue current medications. Continue good therapeutic lifestyle changes which include good diet and exercise. Fall precautions discussed with patient. If an FOBT was given today-  please return it to our front desk. If you are over 49 years old - you may need Prevnar 75 or the adult Pneumonia vaccine.  Flu Shots will be available at our office starting mid- September. Please call and schedule a FLU CLINIC APPOINTMENT.   Please return the FOBT We will call you with the lab work results once those results are available and we will also let you know about the reading of the chest x-ray the radiologist. We will reschedule your DEXA scan and we will make sure that you get your mammogram. Take Tylenol as needed for the foot pain and calf pain and wear good support hose. If the problem with the foot and toe and calf continued, please return to clinic and get an LS spine You could also try some Advair one twice daily after breakfast and supper for a week to see if this helps the  foot pain and toe pain if it gets worse.   Arrie Senate MD

## 2014-11-04 NOTE — Patient Instructions (Addendum)
Medicare Annual Wellness Visit  [facility] and the medical providers at [facility] strive to bring you the best medical care.  In doing so we not only want to address your current medical conditions and concerns but also to detect new conditions early and prevent illness, disease and health-related problems.    Medicare offers a yearly Wellness Visit which allows our clinical staff to assess your need for preventative services including immunizations, lifestyle education, counseling to decrease risk of preventable diseases and screening for fall risk and other medical concerns.    This visit is provided free of charge (no copay) for all Medicare recipients. The clinical pharmacists at [facility] have begun to conduct these Wellness Visits which will also include a thorough review of all your medications.    As you primary medical provider recommend that you make an appointment for your Annual Wellness Visit if you have not done so already this year.  You may set up this appointment before you leave today or you may call back ([phone]) and schedule an appointment.  Please make sure when you call that you mention that you are scheduling your Annual Wellness Visit with the clinical pharmacist so that the appointment may be made for the proper length of time.     Continue current medications. Continue good therapeutic lifestyle changes which include good diet and exercise. Fall precautions discussed with patient. If an FOBT was given today- please return it to our front desk. If you are over 14 years old - you may need Prevnar 28 or the adult Pneumonia vaccine.  Flu Shots will be available at our office starting mid- September. Please call and schedule a FLU CLINIC APPOINTMENT.   Please return the FOBT We will call you with the lab work results once those results are available and we will also let you know about the reading of  the chest x-ray the radiologist. We will reschedule your DEXA scan and we will make sure that you get your mammogram. Take Tylenol as needed for the foot pain and calf pain and wear good support hose. If the problem with the foot and toe and calf continued, please return to clinic and get an LS spine You could also try some Advair one twice daily after breakfast and supper for a week to see if this helps the foot pain and toe pain if it gets worse.

## 2014-11-05 ENCOUNTER — Ambulatory Visit (INDEPENDENT_AMBULATORY_CARE_PROVIDER_SITE_OTHER): Payer: Medicare Other | Admitting: Cardiology

## 2014-11-05 ENCOUNTER — Encounter: Payer: Self-pay | Admitting: Cardiology

## 2014-11-05 ENCOUNTER — Telehealth: Payer: Self-pay

## 2014-11-05 VITALS — BP 120/62 | HR 55 | Ht 65.0 in | Wt 117.5 lb

## 2014-11-05 DIAGNOSIS — I080 Rheumatic disorders of both mitral and aortic valves: Secondary | ICD-10-CM

## 2014-11-05 DIAGNOSIS — R002 Palpitations: Secondary | ICD-10-CM

## 2014-11-05 LAB — BMP8+EGFR
BUN/Creatinine Ratio: 25 (ref 11–26)
BUN: 16 mg/dL (ref 8–27)
CALCIUM: 9.2 mg/dL (ref 8.7–10.3)
CO2: 24 mmol/L (ref 18–29)
Chloride: 103 mmol/L (ref 97–108)
Creatinine, Ser: 0.63 mg/dL (ref 0.57–1.00)
GFR calc Af Amer: 100 mL/min/{1.73_m2} (ref >59)
GFR calc non Af Amer: 87 mL/min/{1.73_m2} (ref >59)
GLUCOSE: 100 mg/dL — AB (ref 65–99)
POTASSIUM: 4.7 mmol/L (ref 3.5–5.2)
Sodium: 144 mmol/L (ref 134–144)

## 2014-11-05 LAB — HEPATIC FUNCTION PANEL
ALT: 10 IU/L (ref 0–32)
AST: 14 IU/L (ref 0–40)
Albumin: 4.4 g/dL (ref 3.5–4.8)
Alkaline Phosphatase: 77 IU/L (ref 39–117)
Bilirubin, Direct: 0.11 mg/dL (ref 0.00–0.40)
TOTAL PROTEIN: 6.7 g/dL (ref 6.0–8.5)
Total Bilirubin: 0.5 mg/dL (ref 0.0–1.2)

## 2014-11-05 LAB — CBC WITH DIFFERENTIAL
BASOS ABS: 0 10*3/uL (ref 0.0–0.2)
BASOS: 0 %
Eos: 1 %
Eosinophils Absolute: 0.1 10*3/uL (ref 0.0–0.4)
HEMATOCRIT: 41.2 % (ref 34.0–46.6)
HEMOGLOBIN: 13.5 g/dL (ref 11.1–15.9)
Immature Grans (Abs): 0 10*3/uL (ref 0.0–0.1)
Immature Granulocytes: 0 %
LYMPHS: 27 %
Lymphocytes Absolute: 1.4 10*3/uL (ref 0.7–3.1)
MCH: 33.5 pg — ABNORMAL HIGH (ref 26.6–33.0)
MCHC: 32.8 g/dL (ref 31.5–35.7)
MCV: 102 fL — ABNORMAL HIGH (ref 79–97)
Monocytes Absolute: 0.4 10*3/uL (ref 0.1–0.9)
Monocytes: 7 %
Neutrophils Absolute: 3.4 10*3/uL (ref 1.4–7.0)
Neutrophils Relative %: 65 %
Platelets: 177 10*3/uL (ref 150–379)
RBC: 4.03 x10E6/uL (ref 3.77–5.28)
RDW: 12.6 % (ref 12.3–15.4)
WBC: 5.3 10*3/uL (ref 3.4–10.8)

## 2014-11-05 LAB — VITAMIN D 25 HYDROXY (VIT D DEFICIENCY, FRACTURES): Vit D, 25-Hydroxy: 34.9 ng/mL (ref 30.0–100.0)

## 2014-11-05 LAB — NMR, LIPOPROFILE
Cholesterol: 219 mg/dL — ABNORMAL HIGH (ref 100–199)
HDL Cholesterol by NMR: 85 mg/dL (ref >39)
HDL Particle Number: 39.2 umol/L (ref >=30.5)
LDL Particle Number: 1214 nmol/L — ABNORMAL HIGH (ref <1000)
LDL Size: 21.3 nm (ref >20.5)
LDL-C: 119 mg/dL — ABNORMAL HIGH (ref 0–99)
LP-IR Score: <25 (ref <=45)
Small LDL Particle Number: 219 nmol/L (ref <=527)
Triglycerides by NMR: 73 mg/dL (ref 0–149)

## 2014-11-05 LAB — URIC ACID: URIC ACID: 3.2 mg/dL (ref 2.5–7.1)

## 2014-11-05 NOTE — Patient Instructions (Signed)
Your physician recommends that you schedule a follow-up appointment in: 6 months with Dr. Hochrein  

## 2014-11-05 NOTE — Progress Notes (Signed)
    HPI The patient presents for followup of palpitations. Since I last saw her she has had no new cardiovascular complaints.    The patient denies any new symptoms such as chest, neck or arm discomfort. There has been no new shortness of breath, PND or orthopnea. There have been no reported increase in palpitations and no presyncope or syncope.  She does still get occasional palpitations.  She walks daily 2 - 3 miles     Allergies  Allergen Reactions  . Cephalexin   . Penicillins   . Statins     myalgias    Current Outpatient Prescriptions  Medication Sig Dispense Refill  . ALPRAZolam (XANAX) 0.25 MG tablet One half tablet 3 times daily as needed 50 tablet 2  . aspirin 81 MG tablet Take 81 mg by mouth daily.    . cholecalciferol (VITAMIN D) 1000 UNITS tablet Take 1,000 Units by mouth daily.    . fluticasone (FLONASE) 50 MCG/ACT nasal spray SPRAY 1 SPRAY IN EACH NOSTRIL ONCE DAILY. 16 g 3  . Probiotic Product (ALIGN PO) Take 1 capsule by mouth daily.    Marland Kitchen trimethoprim (TRIMPEX) 100 MG tablet TAKE 1 TABLET AT BEDTIME OR POST COITALLY 30 tablet 0   No current facility-administered medications for this visit.    Past Medical History  Diagnosis Date  . Hyperlipidemia     Mild  . Palpitations   . Anxiety   . Osteopenia     Past Surgical History  Procedure Laterality Date  . Cystectomy      Breast  . Oophorectomy    . Eye surgery      ROS:  As stated in the HPI and negative for all other systems.  11/05/2014   PHYSICAL EXAM BP 120/62 mmHg  Pulse 55  Ht 5\' 5"  (1.651 m)  Wt 117 lb 8 oz (53.298 kg)  BMI 19.55 kg/m2 GENERAL:  Well appearing, thin NECK:  No jugular venous distention, waveform within normal limits, carotid upstroke brisk and symmetric, no bruits, no thyromegaly LUNGS:  Clear to auscultation bilaterally HEART:  PMI not displaced or sustained,S1 and S2 within normal limits, no S3, no S4, no clicks, no rubs, no murmurs ABD:  Flat, positive bowel sounds normal  in frequency in pitch, no bruits, no rebound, no guarding, no midline pulsatile mass, no hepatomegaly, no splenomegaly EXT:  2 plus pulses throughout, no edema, no cyanosis no clubbing  EKG:  Sinus rhythm, rate 55, axis within normal limits, intervals within normal limits, no acute ST-T wave changes.  11/05/2014  ASSESSMENT AND PLAN  CHEST PAIN:  The pain she describes is atypical. She had a calcium score of 0.  No further testing is indicated.  PALPITATIONS:  She is not particularly bothered by these.  No change in therapy or further studies are needed.   DYSLIPIDEMIA: This is followed by Dr. Laurance Flatten.  Her lipids were drawn yesterday.  I will defer to his management.  At present there is no indication for statin.  Lab Results  Component Value Date   CHOL 214* 05/06/2014   TRIG 73 05/06/2014   HDL 73 05/06/2014   LDLCALC 126* 05/06/2014

## 2014-11-05 NOTE — Telephone Encounter (Signed)
If patient returns call please direct her to x-ray. Unable to leave a message on home or cell phone

## 2014-11-16 DIAGNOSIS — H6122 Impacted cerumen, left ear: Secondary | ICD-10-CM | POA: Diagnosis not present

## 2014-11-16 DIAGNOSIS — H6061 Unspecified chronic otitis externa, right ear: Secondary | ICD-10-CM | POA: Diagnosis not present

## 2014-11-25 ENCOUNTER — Telehealth: Payer: Self-pay | Admitting: Family Medicine

## 2014-11-25 NOTE — Telephone Encounter (Signed)
Calling about Rose Martinez - saw assistant for Dr Alvan Dame yesterday -   Dr Vertell Limber - see the pts notes

## 2015-01-20 ENCOUNTER — Ambulatory Visit (INDEPENDENT_AMBULATORY_CARE_PROVIDER_SITE_OTHER): Payer: Medicare Other

## 2015-01-20 ENCOUNTER — Encounter: Payer: Self-pay | Admitting: Pharmacist

## 2015-01-20 ENCOUNTER — Ambulatory Visit (INDEPENDENT_AMBULATORY_CARE_PROVIDER_SITE_OTHER): Payer: Medicare Other | Admitting: Pharmacist

## 2015-01-20 DIAGNOSIS — M810 Age-related osteoporosis without current pathological fracture: Secondary | ICD-10-CM | POA: Insufficient documentation

## 2015-01-20 DIAGNOSIS — E559 Vitamin D deficiency, unspecified: Secondary | ICD-10-CM

## 2015-01-20 DIAGNOSIS — Z13820 Encounter for screening for osteoporosis: Secondary | ICD-10-CM | POA: Diagnosis not present

## 2015-01-20 LAB — HM DEXA SCAN

## 2015-01-20 NOTE — Progress Notes (Signed)
Patient left before I could review Dexa with her.  She did know of results and acknowledges that she understands the diagnosis of osteoporosis and the high risk of fractures.  However she continues to refuse medication therapy for osteoporosis.  She will continue to get 1200mg  calcium daily through diet and supplementation as needed.   She exercises daily - continue.

## 2015-01-22 ENCOUNTER — Telehealth: Payer: Self-pay | Admitting: Family Medicine

## 2015-01-22 NOTE — Telephone Encounter (Signed)
Patient notified of DEXA results and ostoeporosis.   I discussed treatment options.  She is considering oral bisphonphonate.  Will follow up in 1-2 weeks.

## 2015-02-15 ENCOUNTER — Telehealth: Payer: Self-pay | Admitting: Family Medicine

## 2015-02-15 NOTE — Telephone Encounter (Signed)
This call is not regarding her chart

## 2015-02-25 ENCOUNTER — Ambulatory Visit (INDEPENDENT_AMBULATORY_CARE_PROVIDER_SITE_OTHER): Payer: Medicare Other | Admitting: Family Medicine

## 2015-02-25 ENCOUNTER — Encounter: Payer: Self-pay | Admitting: Family Medicine

## 2015-02-25 VITALS — BP 100/59 | HR 64 | Temp 98.4°F | Ht 65.0 in | Wt 115.0 lb

## 2015-02-25 DIAGNOSIS — L03116 Cellulitis of left lower limb: Secondary | ICD-10-CM

## 2015-02-25 MED ORDER — CIPROFLOXACIN HCL 500 MG PO TABS: 500.0000 mg | ORAL_TABLET | Freq: Two times a day (BID) | ORAL | Status: DC

## 2015-02-25 NOTE — Progress Notes (Signed)
Subjective:  Patient ID: Rose Martinez, female    DOB: Sep 15, 1935  Age: 80 y.o. MRN: [id number]  CC: Recurrent Skin Infections   HPI Rose Martinez [facility] presents for lesion left inner upper thigh. Onset with pea size lesion one year ago. Enlarged, red and swollen now for 1 week.  History Rose Martinez has a past medical history of Hyperlipidemia; Palpitations; Anxiety; and Osteoporosis.   She has past surgical history that includes Cystectomy; Oophorectomy; and Eye surgery.   Her family history includes Heart attack in her father.She reports that she quit smoking about 57 years ago. She does not have any smokeless tobacco history on file. She reports that she does not drink alcohol or use illicit drugs.  Current Outpatient Prescriptions on File Prior to Visit  Medication Sig Dispense Refill  . ALPRAZolam (XANAX) 0.25 MG tablet One half tablet 3 times daily as needed 50 tablet 2  . aspirin 81 MG tablet Take 81 mg by mouth daily.    . cholecalciferol (VITAMIN D) 1000 UNITS tablet Take 1,000 Units by mouth daily.    . fluticasone (FLONASE) 50 MCG/ACT nasal spray SPRAY 1 SPRAY IN EACH NOSTRIL ONCE DAILY. 16 g 3  . Probiotic Product (ALIGN PO) Take 1 capsule by mouth daily.    Marland Kitchen trimethoprim (TRIMPEX) 100 MG tablet TAKE 1 TABLET AT BEDTIME OR POST COITALLY 30 tablet 0   No current facility-administered medications on file prior to visit.    ROS Review of Systems  Constitutional: Negative for fever, chills and appetite change.  HENT: Negative for rhinorrhea, sore throat and trouble swallowing.   Respiratory: Negative for cough and shortness of breath.   Cardiovascular: Negative for chest pain and palpitations.  Gastrointestinal: Negative for abdominal pain.  Genitourinary: Negative for dysuria, urgency and frequency.  Musculoskeletal: Negative for arthralgias.  Skin: Negative for rash.    Objective:  BP 100/59 mmHg  Pulse 64  Temp(Src) 98.4 F (36.9 C) (Oral)  Ht 5\' 5"  (1.651 m)   Wt 115 lb (52.164 kg)  BMI 19.14 kg/m2  BP Readings from Last 3 Encounters:  02/25/15 100/59  11/05/14 120/62  11/04/14 97/51    Wt Readings from Last 3 Encounters:  02/25/15 115 lb (52.164 kg)  11/05/14 117 lb 8 oz (53.298 kg)  11/04/14 115 lb (52.164 kg)     Physical Exam  Constitutional: She is oriented to person, place, and time. She appears well-developed and well-nourished. No distress.  HENT:  Head: Normocephalic and atraumatic.  Eyes: Pupils are equal, round, and reactive to light.  Neck: Normal range of motion.  Cardiovascular: Normal rate, regular rhythm and normal heart sounds.   No murmur heard. Pulmonary/Chest: Effort normal and breath sounds normal. No respiratory distress. She has no wheezes. She has no rales.  Neurological: She is alert and oriented to person, place, and time. She has normal reflexes.  Skin: Skin is warm and dry.  There is a 1 x 2 cm raised indurated lesion that is erythematous. This is located at the left upper inner thigh. There is no fluctuance. It is tender to palpation.  Psychiatric: She has a normal mood and affect. Her behavior is normal. Judgment and thought content normal.    No results found for: HGBA1C  Lab Results  Component Value Date   WBC 5.3 11/04/2014   HGB 13.5 11/04/2014   HCT 41.2 11/04/2014   PLT 177 11/04/2014   GLUCOSE 100* 11/04/2014   CHOL 219* 11/04/2014   TRIG 73  11/04/2014   HDL 85 11/04/2014   LDLCALC 126* 05/06/2014   ALT 10 11/04/2014   AST 14 11/04/2014   NA 144 11/04/2014   K 4.7 11/04/2014   CL 103 11/04/2014   CREATININE 0.63 11/04/2014   BUN 16 11/04/2014   CO2 24 11/04/2014    Ct Cardiac Scoring  05/23/2012   **ADDENDUM** CREATED: 05/23/2012 14:59:51  OVER-READ INTERPRETATION - CT CHEST  The following report is an over-read performed by radiologist Dr. Doristine Martinez. Rose Martinez, M.D. of [facility], [place] on 05/23/2012 14:59:51.  This over-read does not include interpretation of cardiac or coronary  anatomy or pathology.  The coronary calcium score interpretation by the cardiologist is attached.  Comparison:  None   Findings: The visualized mid and lower lung fields are clear.  No effusion.  No adenopathy in the visualized lower mediastinum or hila.  Small amount of pericardial fluid within the pericardial recesses posterior to the pulmonary artery.  Heart is normal size.  Imaging into the upper abdomen demonstrates partial imaging of to low density lesions within the liver, the largest in the left hepatic lobe measuring up to 1.9 cm.  While these are not completely imaged in their entirety, the visualized portions appear represent simple cysts.  No acute bony abnormality.  IMPRESSION: No acute extracardiac abnormality.  Low density lesions partially imaged within the liver, likely cysts.  **END ADDENDUM** SIGNED BY: Rose Martinez. Rose Martinez, M.D.  05/22/2012   Coronary Calcium Score:  Indication: Risk stratification for CAD  The patient was scanned on a Siemens 16 slice Somatom scanner. No contrast was used.  11mm axial slices were performed through the heart.  The data was analyzed on a [employer] work station.  Coronary Calcium:  No calcium was seen in the coronary arteries. The ascending aorta was upper limits in normal size at 3.4 cm and the descending thoracic aorta was 2.3 cm.  See separate radiology report for noncardiac findings on limited coned in lung and soft tissue windows.  Impression:  Coronary Calcium Score = 0  Rose Rouge MD [facility] Original Report Authenticated By: Rose Martinez, M.D.   Assessment & Plan:   There are no diagnoses linked to this encounter. I am having Rose Martinez start on ciprofloxacin. I am also having her maintain her trimethoprim, Probiotic Product (ALIGN PO), fluticasone, cholecalciferol, ALPRAZolam, and aspirin.  Meds ordered this encounter  Medications  . ciprofloxacin (CIPRO) 500 MG tablet    Sig: Take 1 tablet (500 mg total) by mouth 2 (two) times daily.    Dispense:  20  tablet    Refill:  0     Follow-up: Return if symptoms worsen or fail to improve.  Rose Martinez, M.D.

## 2015-02-26 ENCOUNTER — Telehealth: Payer: Self-pay | Admitting: *Deleted

## 2015-02-26 NOTE — Telephone Encounter (Signed)
Pt wants appt for Monday with stacks or Laurance Flatten,

## 2015-02-26 NOTE — Telephone Encounter (Signed)
Pt appt made

## 2015-03-01 ENCOUNTER — Encounter: Payer: Self-pay | Admitting: Family Medicine

## 2015-03-01 ENCOUNTER — Ambulatory Visit (INDEPENDENT_AMBULATORY_CARE_PROVIDER_SITE_OTHER): Payer: Medicare Other | Admitting: Family Medicine

## 2015-03-01 VITALS — BP 92/62 | HR 66 | Temp 98.7°F | Ht 65.0 in | Wt 116.0 lb

## 2015-03-01 DIAGNOSIS — L723 Sebaceous cyst: Secondary | ICD-10-CM | POA: Diagnosis not present

## 2015-03-01 NOTE — Patient Instructions (Signed)
Irrigate with water 2-3 times daily  Keep clean dry dressing on area Continue antibiotic

## 2015-03-01 NOTE — Addendum Note (Signed)
Addended by: Chipper Herb on: 03/01/2015 04:12 PM   Modules accepted: Level of Service

## 2015-03-01 NOTE — Progress Notes (Addendum)
   Subjective:    Patient ID: Rose Martinez, female    DOB: [birth year], 80 y.o.   MRN: [id number]  HPI Patient here today for follow up on left thigh cyst. She started on cipro on 02/25/15.       Patient Active Problem List   Diagnosis Date Noted  . Osteoporosis 01/20/2015  . Recurrent urinary tract infection 09/03/2013  . Chest pain 08/17/2011  . Preventive measure 08/16/2011  . HYPOTENSION, UNSPECIFIED 07/13/2010  . NAUSEA 06/09/2010  . MITRAL REGURGITATION 02/02/2010  . PALPITATIONS 02/02/2010  . HYPERLIPIDEMIA, MILD 01/28/2010   Outpatient Encounter Prescriptions as of 03/01/2015  Medication Sig  . ALPRAZolam (XANAX) 0.25 MG tablet One half tablet 3 times daily as needed  . aspirin 81 MG tablet Take 81 mg by mouth daily.  . cholecalciferol (VITAMIN D) 1000 UNITS tablet Take 1,000 Units by mouth daily.  . ciprofloxacin (CIPRO) 500 MG tablet Take 1 tablet (500 mg total) by mouth 2 (two) times daily.  . Probiotic Product (ALIGN PO) Take 1 capsule by mouth daily.  Marland Kitchen trimethoprim (TRIMPEX) 100 MG tablet TAKE 1 TABLET AT BEDTIME OR POST COITALLY  . [DISCONTINUED] fluticasone (FLONASE) 50 MCG/ACT nasal spray SPRAY 1 SPRAY IN EACH NOSTRIL ONCE DAILY.    Review of Systems  Constitutional: Negative.   HENT: Negative.   Eyes: Negative.   Respiratory: Negative.   Cardiovascular: Negative.   Gastrointestinal: Negative.   Endocrine: Negative.   Genitourinary: Negative.   Musculoskeletal: Negative.   Skin: Negative.        Cyst - left upper thigh   Allergic/Immunologic: Negative.   Neurological: Negative.   Hematological: Negative.   Psychiatric/Behavioral: Negative.  Negative for suicidal ideas.       Objective:   Physical Exam  Constitutional: She is oriented to person, place, and time. She appears well-developed and well-nourished. No distress.  Neurological: She is alert and oriented to person, place, and time.  Skin: Skin is warm and dry. No rash noted. There is  erythema. No pallor.  Pressure applied and additional sebaceous material drained  From cyst  Psychiatric: She has a normal mood and affect. Her behavior is normal. Judgment and thought content normal.  Nursing note and vitals reviewed.  BP 92/62 mmHg  Pulse 66  Temp(Src) 98.7 F (37.1 C) (Oral)  Ht 5\' 5"  (1.651 m)  Wt 116 lb (52.617 kg)  BMI 19.30 kg/m2        Assessment & Plan:   1. Sebaceous cyst  Patient Instructions  Irrigate with water 2-3 times daily  Keep clean dry dressing on area Continue antibiotic    Arrie Senate MD

## 2015-03-03 ENCOUNTER — Encounter: Payer: Self-pay | Admitting: Family Medicine

## 2015-03-03 ENCOUNTER — Ambulatory Visit (INDEPENDENT_AMBULATORY_CARE_PROVIDER_SITE_OTHER): Payer: Medicare Other | Admitting: Family Medicine

## 2015-03-03 VITALS — BP 99/69 | HR 63 | Temp 97.9°F | Ht 65.0 in | Wt 116.0 lb

## 2015-03-03 DIAGNOSIS — L723 Sebaceous cyst: Secondary | ICD-10-CM | POA: Diagnosis not present

## 2015-03-03 NOTE — Progress Notes (Signed)
   Subjective:    Patient ID: Rose Martinez, female    DOB: 1935-12-09, 80 y.o.   MRN: [id number]  HPI Patient here today for 2 day follow up on sebaceous cyst of left thigh. Since the patient was seen a couple days ago, there has been minimal drainage and less soreness. The patient's been keeping a dressing on the area during the day but lets it remains open at night when she is sleeping. She has been taking antibiotics since this past Friday which was 5 days ago.     Patient Active Problem List   Diagnosis Date Noted  . Osteoporosis 01/20/2015  . Recurrent urinary tract infection 09/03/2013  . Chest pain 08/17/2011  . Preventive measure 08/16/2011  . HYPOTENSION, UNSPECIFIED 07/13/2010  . NAUSEA 06/09/2010  . MITRAL REGURGITATION 02/02/2010  . PALPITATIONS 02/02/2010  . HYPERLIPIDEMIA, MILD 01/28/2010   Outpatient Encounter Prescriptions as of 03/03/2015  Medication Sig  . ALPRAZolam (XANAX) 0.25 MG tablet One half tablet 3 times daily as needed  . aspirin 81 MG tablet Take 81 mg by mouth daily.  . cholecalciferol (VITAMIN D) 1000 UNITS tablet Take 1,000 Units by mouth daily.  . ciprofloxacin (CIPRO) 500 MG tablet Take 1 tablet (500 mg total) by mouth 2 (two) times daily.  . Probiotic Product (ALIGN PO) Take 1 capsule by mouth daily.  Marland Kitchen trimethoprim (TRIMPEX) 100 MG tablet TAKE 1 TABLET AT BEDTIME OR POST COITALLY    Review of Systems  Constitutional: Negative.   HENT: Negative.   Eyes: Negative.   Respiratory: Negative.   Cardiovascular: Negative.   Gastrointestinal: Negative.   Endocrine: Negative.   Genitourinary: Negative.   Musculoskeletal: Negative.   Skin: Negative.        rck cyst- left thigh  Allergic/Immunologic: Negative.   Neurological: Negative.   Hematological: Negative.   Psychiatric/Behavioral: Negative.        Objective:   Physical Exam BP 99/69 mmHg  Pulse 63  Temp(Src) 97.9 F (36.6 C) (Oral)  Ht 5\' 5"  (1.651 m)  Wt 116 lb (52.617 kg)   BMI 19.30 kg/m2  The sebaceous cyst that was on the medial aspect of the upper left thigh is much improved and there does not appear to be any residual of the cyst and there is no drainage today. There was minimal tenderness to palpation and the opening appears to have healed in well.      Assessment & Plan:  1. Sebaceous cyst -This is improving significantly  Patient Instructions  The patient should continue and complete a full course of antibiotics for 10 days She should keep the dressing on the wound during the day She should return to the clinic if there is any worsening of the infection is been drained.   Arrie Senate MD

## 2015-03-03 NOTE — Patient Instructions (Signed)
The patient should continue and complete a full course of antibiotics for 10 days She should keep the dressing on the wound during the day She should return to the clinic if there is any worsening of the infection is been drained.

## 2015-03-30 ENCOUNTER — Other Ambulatory Visit: Payer: Self-pay | Admitting: Family Medicine

## 2015-03-31 ENCOUNTER — Other Ambulatory Visit: Payer: Self-pay | Admitting: Family Medicine

## 2015-03-31 NOTE — Telephone Encounter (Signed)
Last seen 03/03/15 DWM  If approved route to nurse to call into [facility]

## 2015-03-31 NOTE — Telephone Encounter (Signed)
This is okay to refill with 5 months of refills

## 2015-05-06 ENCOUNTER — Ambulatory Visit: Payer: Medicare Other | Admitting: Family Medicine

## 2015-05-21 DIAGNOSIS — L812 Freckles: Secondary | ICD-10-CM | POA: Diagnosis not present

## 2015-05-21 DIAGNOSIS — D225 Melanocytic nevi of trunk: Secondary | ICD-10-CM | POA: Diagnosis not present

## 2015-05-21 DIAGNOSIS — L821 Other seborrheic keratosis: Secondary | ICD-10-CM | POA: Diagnosis not present

## 2015-05-21 DIAGNOSIS — D1801 Hemangioma of skin and subcutaneous tissue: Secondary | ICD-10-CM | POA: Diagnosis not present

## 2015-05-21 DIAGNOSIS — L723 Sebaceous cyst: Secondary | ICD-10-CM | POA: Diagnosis not present

## 2015-05-21 DIAGNOSIS — L720 Epidermal cyst: Secondary | ICD-10-CM | POA: Diagnosis not present

## 2015-05-26 ENCOUNTER — Ambulatory Visit: Payer: Medicare Other | Admitting: Cardiology

## 2015-06-03 ENCOUNTER — Ambulatory Visit (INDEPENDENT_AMBULATORY_CARE_PROVIDER_SITE_OTHER): Payer: Medicare Other | Admitting: Family Medicine

## 2015-06-03 ENCOUNTER — Encounter: Payer: Self-pay | Admitting: Family Medicine

## 2015-06-03 VITALS — BP 98/66 | HR 61 | Temp 97.7°F | Ht 65.0 in | Wt 117.0 lb

## 2015-06-03 DIAGNOSIS — R002 Palpitations: Secondary | ICD-10-CM

## 2015-06-03 DIAGNOSIS — F4323 Adjustment disorder with mixed anxiety and depressed mood: Secondary | ICD-10-CM | POA: Insufficient documentation

## 2015-06-03 DIAGNOSIS — E559 Vitamin D deficiency, unspecified: Secondary | ICD-10-CM

## 2015-06-03 DIAGNOSIS — F4322 Adjustment disorder with anxiety: Secondary | ICD-10-CM

## 2015-06-03 DIAGNOSIS — E785 Hyperlipidemia, unspecified: Secondary | ICD-10-CM | POA: Insufficient documentation

## 2015-06-03 DIAGNOSIS — W57XXXA Bitten or stung by nonvenomous insect and other nonvenomous arthropods, initial encounter: Secondary | ICD-10-CM

## 2015-06-03 NOTE — Patient Instructions (Addendum)
Medicare Annual Wellness Visit  [place] and the medical providers at [facility] strive to bring you the best medical care.  In doing so we not only want to address your current medical conditions and concerns but also to detect new conditions early and prevent illness, disease and health-related problems.    Medicare offers a yearly Wellness Visit which allows our clinical staff to assess your need for preventative services including immunizations, lifestyle education, counseling to decrease risk of preventable diseases and screening for fall risk and other medical concerns.    This visit is provided free of charge (no copay) for all Medicare recipients. The clinical pharmacists at [facility] have begun to conduct these Wellness Visits which will also include a thorough review of all your medications.    As you primary medical provider recommend that you make an appointment for your Annual Wellness Visit if you have not done so already this year.  You may set up this appointment before you leave today or you may call back ([phone]) and schedule an appointment.  Please make sure when you call that you mention that you are scheduling your Annual Wellness Visit with the clinical pharmacist so that the appointment may be made for the proper length of time.     Continue current medications. Continue good therapeutic lifestyle changes which include good diet and exercise. Fall precautions discussed with patient. If an FOBT was given today- please return it to our front desk. If you are over 49 years old - you may need Prevnar 37 or the adult Pneumonia vaccine.  Flu Shots are still available at our office. If you still haven't had one please call to set up a nurse visit to get one.   After your visit with [place] today you will receive a survey in the mail or online from [employer] regarding your care with [place]. Please take a moment to  fill this out. Your feedback is very important to [place] as you can help [place] better understand your patient needs as well as improve your experience and satisfaction. WE CARE ABOUT YOU!!!   Continue to drink plenty of fluids Drink as much caffeine as your body will tolerate without causing palpitations Try deep [place] off, dry Try [place] skin so soft with bug repellent Check legs daily for ticks If a tick remains attached for greater than 24-36 hours please get back in touch with [place] Please keep up-to-date on mammograms and pelvic exam Please return the FOBT Schedule yourself to get a Prevnar vaccine   Lyme Disease You may have been bitten by a tick and are to watch for the development of Lyme Disease. Lyme Disease is an infection that is caused by a bacteria The bacteria causing this disease is named Borreilia burgdorferi. If a tick is infected with this bacteria and then bites you, then Lyme Disease may occur. These ticks are carried by deer and rodents such as rabbits and mice and infest grassy as well as forested areas. Fortunately most tick bites do not cause Lyme Disease.  Lyme Disease is easier to prevent than to treat. First, covering your legs with clothing when walking in areas where ticks are possibly abundant will prevent their attachment because ticks tend to stay within inches of the ground. Second, using insecticides containing DEET can be applied on skin or clothing. Last, because it takes about 12 to 24 hours for the tick to transmit the disease after attachment to the human  host, you should inspect your body for ticks twice a day when you are in areas where Lyme Disease is common. You must look thoroughly when searching for ticks. The Ixodes tick that carries Lyme Disease is very small. It is around the size of a sesame seed (picture of tick is not actual size). Removal is best done by grasping the tick by the head and pulling it out. Do not to squeeze the body of the tick. This could inject  the infecting bacteria into the bite site. Wash the area of the bite with an antiseptic solution after removal.  Lyme Disease is a disease that may affect many body systems. Because of the small size of the biting tick, most people do not notice being bitten. The first sign of an infection is usually a round red rash that extends out from the center of the tick bite. The center of the lesion may be blood colored (hemorrhagic) or have tiny blisters (vesicular). Most lesions have bright red outer borders and partial central clearing. This rash may extend out many inches in diameter, and multiple lesions may be present. Other symptoms such as fatigue, headaches, chills and fever, general achiness and swelling of lymph glands may also occur. If this first stage of the disease is left untreated, these symptoms may gradually resolve by themselves, or progressive symptoms may occur because of spread of infection to other areas of the body.  Follow up with your caregiver to have testing and treatment if you have a tick bite and you develop any of the above complaints. Your caregiver may recommend preventative (prophylactic) medications which kill bacteria (antibiotics). Once a diagnosis of Lyme Disease is made, antibiotic treatment is highly likely to cure the disease. Effective treatment of late stage Lyme Disease may require longer courses of antibiotic therapy.  MAKE SURE YOU:   Understand these instructions.  Will watch your condition.  Will get help right away if you are not doing well or get worse. Document Released: 03/12/2001 Document Revised: 02/26/2012 Document Reviewed: 05/14/2009 [facility] Patient Information 2015 [place], [place]. This information is not intended to replace advice given to you by your health care provider. Make sure you discuss any questions you have with your health care provider.

## 2015-06-03 NOTE — Progress Notes (Signed)
Subjective:    Patient ID: Rose Martinez, female    DOB: 11/07/1935, 80 y.o.   MRN: [id number]  HPI Pt here for follow up and management of chronic medical problems which includes hyperlipidemia. She is taking medications regularly. The patient is doing well today with no specific complaints. She has an FOBT card at home which she has to return. She will get fasting lab work. She is also due to get a Prevnar vaccine and she wants to wait on this. She seems to be, today and issues in her life have settled down somewhat. She does see the cardiologist periodically and the urologist periodically. She also sees an ear nose and throat specialist on occasions.       Patient Active Problem List   Diagnosis Date Noted  . Osteoporosis 01/20/2015  . Recurrent urinary tract infection 09/03/2013  . Chest pain 08/17/2011  . Preventive measure 08/16/2011  . HYPOTENSION, UNSPECIFIED 07/13/2010  . NAUSEA 06/09/2010  . MITRAL REGURGITATION 02/02/2010  . PALPITATIONS 02/02/2010  . HYPERLIPIDEMIA, MILD 01/28/2010   Outpatient Encounter Prescriptions as of 06/03/2015  Medication Sig  . aspirin 81 MG tablet Take 81 mg by mouth daily.  . cholecalciferol (VITAMIN D) 1000 UNITS tablet Take 1,000 Units by mouth daily.  . Probiotic Product (ALIGN PO) Take 1 capsule by mouth daily.  Marland Kitchen trimethoprim (TRIMPEX) 100 MG tablet TAKE 1 TABLET AT BEDTIME OR POST COITALLY  . XANAX 0.25 MG tablet TAKE (1/2) TABLET THREE TIMES DAILY.  . [DISCONTINUED] ciprofloxacin (CIPRO) 500 MG tablet Take 1 tablet (500 mg total) by mouth 2 (two) times daily.   No facility-administered encounter medications on file as of 06/03/2015.       Review of Systems  Constitutional: Negative.   HENT: Negative.   Eyes: Negative.   Respiratory: Negative.   Cardiovascular: Negative.   Gastrointestinal: Negative.   Endocrine: Negative.   Genitourinary: Negative.   Musculoskeletal: Negative.   Skin: Negative.     Allergic/Immunologic: Negative.   Neurological: Negative.   Hematological: Negative.   Psychiatric/Behavioral: Negative.        Objective:   Physical Exam  Constitutional: She is oriented to person, place, and time. She appears well-developed and well-nourished.  HENT:  Head: Normocephalic and atraumatic.  Right Ear: External ear normal.  Left Ear: External ear normal.  Nose: Nose normal.  Mouth/Throat: Oropharynx is clear and moist.  Eyes: Conjunctivae and EOM are normal. Pupils are equal, round, and reactive to light. Right eye exhibits no discharge. Left eye exhibits no discharge. No scleral icterus.  Neck: Normal range of motion. Neck supple. No thyromegaly present.  No carotid bruits  Cardiovascular: Normal rate, regular rhythm, normal heart sounds and intact distal pulses.   No murmur heard. At 72/m  Pulmonary/Chest: Effort normal and breath sounds normal. No respiratory distress. She has no wheezes. She has no rales. She exhibits no tenderness.  Abdominal: Soft. Bowel sounds are normal. She exhibits no mass. There is no tenderness. There is no rebound and no guarding.  Musculoskeletal: Normal range of motion. She exhibits no edema or tenderness.  Lymphadenopathy:    She has no cervical adenopathy.  Neurological: She is alert and oriented to person, place, and time. She has normal reflexes. No cranial nerve deficit.  Skin: Skin is warm and dry. No rash noted.  Psychiatric: She has a normal mood and affect. Her behavior is normal. Judgment and thought content normal.  Nursing note and vitals reviewed.  BP 98/66 mmHg  Pulse 61  Temp(Src) 97.7 F (36.5 C) (Oral)  Ht 5' 5"  (1.651 m)  Wt 117 lb (53.071 kg)  BMI 19.47 kg/m2        Assessment & Plan:  1. Hyperlipidemia -Continue aggressive therapeutic lifestyle changes which include diet and exercise - POCT CBC; Future - BMP8+EGFR; Future - Hepatic function panel; Future - NMR, lipoprofile; Future  2. Vitamin D  deficiency -Continue current vitamin D dosing pending results of lab work - POCT CBC; Future - Vit D  25 hydroxy (rtn osteoporosis monitoring); Future  3. Tick bite -Be aware of tick bites and if there is any question of the length of time the tick was on the body please call and with [place]  4. Adjustment disorder with anxious mood -The patient takes Xanax and she is doing well currently with her anxiety  5. Palpitations -She sees the cardiologist soon and will continue with low-dose caffeine intake as tolerated  Patient Instructions                       Medicare Annual Wellness Visit  [place] and the medical providers at [facility] strive to bring you the best medical care.  In doing so we not only want to address your current medical conditions and concerns but also to detect new conditions early and prevent illness, disease and health-related problems.    Medicare offers a yearly Wellness Visit which allows our clinical staff to assess your need for preventative services including immunizations, lifestyle education, counseling to decrease risk of preventable diseases and screening for fall risk and other medical concerns.    This visit is provided free of charge (no copay) for all Medicare recipients. The clinical pharmacists at [facility] have begun to conduct these Wellness Visits which will also include a thorough review of all your medications.    As you primary medical provider recommend that you make an appointment for your Annual Wellness Visit if you have not done so already this year.  You may set up this appointment before you leave today or you may call back ([phone]) and schedule an appointment.  Please make sure when you call that you mention that you are scheduling your Annual Wellness Visit with the clinical pharmacist so that the appointment may be made for the proper length of time.     Continue current  medications. Continue good therapeutic lifestyle changes which include good diet and exercise. Fall precautions discussed with patient. If an FOBT was given today- please return it to our front desk. If you are over 57 years old - you may need Prevnar 47 or the adult Pneumonia vaccine.  Flu Shots are still available at our office. If you still haven't had one please call to set up a nurse visit to get one.   After your visit with [place] today you will receive a survey in the mail or online from [employer] regarding your care with [place]. Please take a moment to fill this out. Your feedback is very important to [place] as you can help [place] better understand your patient needs as well as improve your experience and satisfaction. WE CARE ABOUT YOU!!!   Continue to drink plenty of fluids Drink as much caffeine as your body will tolerate without causing palpitations Try deep [place] off, dry Try [place] skin so soft with bug repellent Check legs daily for ticks If a tick remains attached for greater than 24-36 hours  please get back in touch with [place] Please keep up-to-date on mammograms and pelvic exam Please return the FOBT Schedule yourself to get a Prevnar vaccine   Lyme Disease You may have been bitten by a tick and are to watch for the development of Lyme Disease. Lyme Disease is an infection that is caused by a bacteria The bacteria causing this disease is named Borreilia burgdorferi. If a tick is infected with this bacteria and then bites you, then Lyme Disease may occur. These ticks are carried by deer and rodents such as rabbits and mice and infest grassy as well as forested areas. Fortunately most tick bites do not cause Lyme Disease.  Lyme Disease is easier to prevent than to treat. First, covering your legs with clothing when walking in areas where ticks are possibly abundant will prevent their attachment because ticks tend to stay within inches of the ground. Second, using insecticides containing DEET can  be applied on skin or clothing. Last, because it takes about 12 to 24 hours for the tick to transmit the disease after attachment to the human host, you should inspect your body for ticks twice a day when you are in areas where Lyme Disease is common. You must look thoroughly when searching for ticks. The Ixodes tick that carries Lyme Disease is very small. It is around the size of a sesame seed (picture of tick is not actual size). Removal is best done by grasping the tick by the head and pulling it out. Do not to squeeze the body of the tick. This could inject the infecting bacteria into the bite site. Wash the area of the bite with an antiseptic solution after removal.  Lyme Disease is a disease that may affect many body systems. Because of the small size of the biting tick, most people do not notice being bitten. The first sign of an infection is usually a round red rash that extends out from the center of the tick bite. The center of the lesion may be blood colored (hemorrhagic) or have tiny blisters (vesicular). Most lesions have bright red outer borders and partial central clearing. This rash may extend out many inches in diameter, and multiple lesions may be present. Other symptoms such as fatigue, headaches, chills and fever, general achiness and swelling of lymph glands may also occur. If this first stage of the disease is left untreated, these symptoms may gradually resolve by themselves, or progressive symptoms may occur because of spread of infection to other areas of the body.  Follow up with your caregiver to have testing and treatment if you have a tick bite and you develop any of the above complaints. Your caregiver may recommend preventative (prophylactic) medications which kill bacteria (antibiotics). Once a diagnosis of Lyme Disease is made, antibiotic treatment is highly likely to cure the disease. Effective treatment of late stage Lyme Disease may require longer courses of antibiotic therapy.   MAKE SURE YOU:   Understand these instructions.  Will watch your condition.  Will get help right away if you are not doing well or get worse. Document Released: 03/12/2001 Document Revised: 02/26/2012 Document Reviewed: 05/14/2009 [facility] Patient Information 2015 [place], [place]. This information is not intended to replace advice given to you by your health care provider. Make sure you discuss any questions you have with your health care provider.    Arrie Senate MD

## 2015-06-28 ENCOUNTER — Ambulatory Visit (INDEPENDENT_AMBULATORY_CARE_PROVIDER_SITE_OTHER): Payer: Medicare Other | Admitting: Cardiology

## 2015-06-28 ENCOUNTER — Encounter: Payer: Self-pay | Admitting: Cardiology

## 2015-06-28 VITALS — BP 106/58 | HR 49 | Ht 64.0 in | Wt 119.0 lb

## 2015-06-28 DIAGNOSIS — E785 Hyperlipidemia, unspecified: Secondary | ICD-10-CM

## 2015-06-28 NOTE — Progress Notes (Signed)
    HPI The patient presents for followup of palpitations. Since I last saw her she has had no new cardiovascular complaints.    The patient denies any new symptoms such as chest, neck or arm discomfort. There has been no new shortness of breath, PND or orthopnea. There have been no reported increase in palpitations and no presyncope or syncope.  She does still get occasional palpitations.  She walks daily 2 - 3 miles     Allergies  Allergen Reactions  . Cephalexin   . Penicillins   . Statins     myalgias    Current Outpatient Prescriptions  Medication Sig Dispense Refill  . aspirin 81 MG tablet Take 81 mg by mouth daily.    . cholecalciferol (VITAMIN D) 1000 UNITS tablet Take 1,000 Units by mouth daily.    . Probiotic Product (ALIGN PO) Take 1 capsule by mouth daily.    Marland Kitchen trimethoprim (TRIMPEX) 100 MG tablet TAKE 1 TABLET AT BEDTIME OR POST COITALLY 30 tablet 0  . XANAX 0.25 MG tablet TAKE (1/2) TABLET THREE TIMES DAILY. (Patient taking differently: TAKE (1/2) TABLET THREE TIMES AS NEEDED.) 50 tablet 1   No current facility-administered medications for this visit.    Past Medical History  Diagnosis Date  . Hyperlipidemia     Mild  . Palpitations   . Anxiety   . Osteoporosis     Past Surgical History  Procedure Laterality Date  . Cystectomy      Breast  . Oophorectomy    . Eye surgery      ROS:  As stated in the HPI and negative for all other systems.  06/28/2015   PHYSICAL EXAM BP 106/58 mmHg  Pulse 49  Ht 5\' 4"  (1.626 m)  Wt 119 lb (53.978 kg)  BMI 20.42 kg/m2 GENERAL:  Well appearing, thin NECK:  No jugular venous distention, waveform within normal limits, carotid upstroke brisk and symmetric, no bruits, no thyromegaly LUNGS:  Clear to auscultation bilaterally HEART:  PMI not displaced or sustained,S1 and S2 within normal limits, no S3, no S4, no clicks, no rubs, no murmurs ABD:  Flat, positive bowel sounds normal in frequency in pitch, no bruits, no rebound,  no guarding, no midline pulsatile mass, no hepatomegaly, no splenomegaly EXT:  2 plus pulses throughout, no edema, no cyanosis no clubbing  EKG:  Sinus rhythm, rate 49, axis within normal limits, intervals within normal limits, no acute ST-T wave changes.  06/28/2015  ASSESSMENT AND PLAN  CHEST PAIN:  She is not having any further chest pain.    PALPITATIONS:  She is not particularly bothered by these.  No change in therapy or further studies are needed.   DYSLIPIDEMIA: This is followed by Dr. Laurance Flatten.  She is due to have these checked again.

## 2015-06-28 NOTE — Patient Instructions (Signed)
Your physician wants you to follow-up in: 6 months with Dr. Percival Spanish in [place]. You will receive a reminder letter in the mail two months in advance. If you don't receive a letter, please call our office to schedule the follow-up appointment.  Dr. Levora Dredge Dr. Kristeen Miss

## 2015-07-07 ENCOUNTER — Telehealth: Payer: Self-pay | Admitting: Family Medicine

## 2015-07-07 DIAGNOSIS — E785 Hyperlipidemia, unspecified: Secondary | ICD-10-CM

## 2015-07-07 DIAGNOSIS — E559 Vitamin D deficiency, unspecified: Secondary | ICD-10-CM

## 2015-07-07 DIAGNOSIS — I959 Hypotension, unspecified: Secondary | ICD-10-CM

## 2015-07-07 NOTE — Telephone Encounter (Signed)
Orders placed.

## 2015-07-09 DIAGNOSIS — H44512 Absolute glaucoma, left eye: Secondary | ICD-10-CM | POA: Diagnosis not present

## 2015-07-09 DIAGNOSIS — H47232 Glaucomatous optic atrophy, left eye: Secondary | ICD-10-CM | POA: Diagnosis not present

## 2015-07-09 DIAGNOSIS — H2513 Age-related nuclear cataract, bilateral: Secondary | ICD-10-CM | POA: Diagnosis not present

## 2015-07-16 ENCOUNTER — Encounter: Payer: Self-pay | Admitting: Family Medicine

## 2015-07-16 ENCOUNTER — Other Ambulatory Visit (INDEPENDENT_AMBULATORY_CARE_PROVIDER_SITE_OTHER): Payer: Medicare Other

## 2015-07-16 DIAGNOSIS — E559 Vitamin D deficiency, unspecified: Secondary | ICD-10-CM

## 2015-07-16 DIAGNOSIS — I959 Hypotension, unspecified: Secondary | ICD-10-CM | POA: Diagnosis not present

## 2015-07-16 DIAGNOSIS — E785 Hyperlipidemia, unspecified: Secondary | ICD-10-CM

## 2015-07-16 DIAGNOSIS — D696 Thrombocytopenia, unspecified: Secondary | ICD-10-CM | POA: Insufficient documentation

## 2015-07-16 LAB — POCT CBC
GRANULOCYTE PERCENT: 59.3 % (ref 37–80)
HCT, POC: 41.4 % (ref 37.7–47.9)
Hemoglobin: 13.5 g/dL (ref 12.2–16.2)
Lymph, poc: 1.6 (ref 0.6–3.4)
MCH, POC: 31.9 pg — AB (ref 27–31.2)
MCHC: 32.7 g/dL (ref 31.8–35.4)
MCV: 97.7 fL — AB (ref 80–97)
MPV: 10.7 fL (ref 0–99.8)
PLATELET COUNT, POC: 137 10*3/uL — AB (ref 142–424)
POC Granulocyte: 2.7 (ref 2–6.9)
POC LYMPH PERCENT: 34.4 %L (ref 10–50)
RBC: 4.24 M/uL (ref 4.04–5.48)
RDW, POC: 12.4 %
WBC: 4.6 10*3/uL (ref 4.6–10.2)

## 2015-07-16 NOTE — Progress Notes (Signed)
Lab only 

## 2015-07-17 LAB — NMR, LIPOPROFILE
Cholesterol: 225 mg/dL — ABNORMAL HIGH (ref 100–199)
HDL CHOLESTEROL BY NMR: 79 mg/dL (ref >39)
HDL Particle Number: 34.6 umol/L (ref >=30.5)
LDL Particle Number: 1451 nmol/L — ABNORMAL HIGH (ref <1000)
LDL Size: 21.4 nm (ref >20.5)
LDL-C: 131 mg/dL — AB (ref 0–99)
LP-IR Score: <25 (ref <=45)
Small LDL Particle Number: 315 nmol/L (ref <=527)
Triglycerides by NMR: 77 mg/dL (ref 0–149)

## 2015-07-17 LAB — BMP8+EGFR
BUN / CREAT RATIO: 17 (ref 11–26)
BUN: 13 mg/dL (ref 8–27)
CO2: 23 mmol/L (ref 18–29)
CREATININE: 0.76 mg/dL (ref 0.57–1.00)
Calcium: 9.4 mg/dL (ref 8.7–10.3)
Chloride: 104 mmol/L (ref 97–108)
GFR calc Af Amer: 88 mL/min/{1.73_m2} (ref >59)
GFR, EST NON AFRICAN AMERICAN: 76 mL/min/{1.73_m2} (ref >59)
Glucose: 98 mg/dL (ref 65–99)
Potassium: 5 mmol/L (ref 3.5–5.2)
SODIUM: 146 mmol/L — AB (ref 134–144)

## 2015-07-17 LAB — VITAMIN D 25 HYDROXY (VIT D DEFICIENCY, FRACTURES): VIT D 25 HYDROXY: 42.6 ng/mL (ref 30.0–100.0)

## 2015-07-17 LAB — HEPATIC FUNCTION PANEL
ALT: 10 IU/L (ref 0–32)
AST: 13 IU/L (ref 0–40)
Albumin: 4.4 g/dL (ref 3.5–4.8)
Alkaline Phosphatase: 73 IU/L (ref 39–117)
BILIRUBIN TOTAL: 0.4 mg/dL (ref 0.0–1.2)
Bilirubin, Direct: 0.1 mg/dL (ref 0.00–0.40)
TOTAL PROTEIN: 6.7 g/dL (ref 6.0–8.5)

## 2015-07-20 ENCOUNTER — Other Ambulatory Visit: Payer: Self-pay | Admitting: Family Medicine

## 2015-07-21 ENCOUNTER — Telehealth: Payer: Self-pay | Admitting: Cardiology

## 2015-07-21 ENCOUNTER — Ambulatory Visit (INDEPENDENT_AMBULATORY_CARE_PROVIDER_SITE_OTHER): Payer: Medicare Other | Admitting: Pharmacist

## 2015-07-21 ENCOUNTER — Encounter: Payer: Self-pay | Admitting: Pharmacist

## 2015-07-21 VITALS — BP 106/70 | HR 70 | Ht 65.0 in | Wt 120.0 lb

## 2015-07-21 DIAGNOSIS — E785 Hyperlipidemia, unspecified: Secondary | ICD-10-CM | POA: Diagnosis not present

## 2015-07-21 NOTE — Telephone Encounter (Signed)
Last seen 06/03/15 DWM  If approved print

## 2015-07-21 NOTE — Telephone Encounter (Signed)
Pt called in wanting to speak with Dr. Percival Spanish about her cholesterol. She says that Dr. Laurance Flatten wants to start her on a new cholesterol medicine and she doesn't feel that it is necessary. Please f/u   Thanks

## 2015-07-21 NOTE — Telephone Encounter (Signed)
Returned call to patient.She stated she wanted to speak to Dr.Hochrein personally about her recent cholesterol levels.Stated Dr.Moore wants her to start on cholesterol medication,but she wants to speak to Dr.Hochrein first.Advised Dr.Hochrein out of office this week.Will send message to him.Stated if he cannot reach her on home # to call cell # [phone].

## 2015-07-21 NOTE — Progress Notes (Signed)
Patient ID: Reinaldo Raddle, female   DOB: 08-20-1935, 80 y.o.   MRN: [id number] [facility] Consultation  Chief Complaint:   Chief Complaint  Patient presents with  . Hyperlipidemia     HPI:  Patient has been seen previously and discussed dietary and TLC changes to improve lipids.  She has tried crestor in patient but caused leg pain. She does have a family history of her father having MI at 43yo She does not smoke and does not have HTN or take antihypertensive medications.  She will take propanolol as needed for anxiety but has not taken this is over 2 years. Diet - lots of vegetables and fruit.  No fried foods, no cheese.  Drinks almond/coconut milk.  Eats lots of fish - tuna and salmon   Component 07/16/2015 05/06/2014 09/04/2013   CHOL 225 215 233   LDL 131 126 144   LDL-P 1451 1463 1945   HDL 79 73 74   TRIG 77 73 75    Assessment: CHD/CHF Risk Equivalents:  none AHA50yrs risk:  14.5%  NCEP Risk Factors Present:  family history and age Primary Problem(s):  LDL or LDL-P elevated  Current NCEP Goals: LDL Goal < 100 HDL Goal >/= 40 Tg Goal < 150 Non-HDL Goal < 130  Secondary cause of hyperlipidemia present:  none Low fat diet followed?  Yes -   Low carb diet followed?  Yes -   Exercise?  Yes -    Filed Vitals:   07/21/15 1020  BP: 106/70  Pulse: 70   Filed Weights   07/21/15 1020  Weight: 120 lb (54.432 kg)   Body mass index is 19.97 kg/(m^2).   Assessment Elevated LDL   Recommendations: Reviewed low fat / mediterranean diet Patient is considering retrying Crestor at a low dose.  She has called Dr Jenkins Rouge and would like to get his opinion.  I think this is appropriate. Continue with regular exercist Recheck Lipid Panel:  3 to 6 months   Cherre Robins, PharmD, CPP

## 2015-08-05 NOTE — Telephone Encounter (Signed)
Can this encouter be closed?

## 2015-08-17 ENCOUNTER — Telehealth: Payer: Self-pay | Admitting: Family Medicine

## 2015-08-19 NOTE — Telephone Encounter (Signed)
Can this be closed ?

## 2015-08-19 NOTE — Telephone Encounter (Signed)
Spoke with pt about referral to the [facility]

## 2015-08-27 ENCOUNTER — Telehealth: Payer: Self-pay | Admitting: Cardiology

## 2015-08-27 NOTE — Telephone Encounter (Signed)
Pt had recent lipomed profile by Dr Laurance Flatten who ordered for her to start Crestor d/t the results.  Pt does not feel comfortable it doing this and wants the need for pharmaceutics to treat her hyperlipidemia to be determined by Dr Percival Spanish.  She does not want to take medications because of the way they make her feel.  She prefers only to speak with Dr Percival Spanish in reference to this issue and would like HIM to call her back.  Advised I will forward this information to him.  She can be reached at [phone].

## 2015-08-27 NOTE — Telephone Encounter (Signed)
New message     Patient calling seen Dr. Percival Spanish about 3 weeks ago .  Need to discuss her cholesterol lab work. Would like a call back today

## 2015-08-27 NOTE — Telephone Encounter (Signed)
She does not have to start the statin.  t

## 2015-08-30 NOTE — Telephone Encounter (Signed)
Attempted to call - phone rang several times and then sounded like a fax machine.  Will attempt to contact here at a later time.

## 2015-09-01 DIAGNOSIS — N3000 Acute cystitis without hematuria: Secondary | ICD-10-CM | POA: Diagnosis not present

## 2015-09-06 NOTE — Telephone Encounter (Signed)
Patient c/o Palpitations:  High priority if patient c/o lightheadedness and shortness of breath.  1. How long have you been having palpitations? She has them regularly. But she is under double stress and she wants an appt to see what going on.   2. Are you currently experiencing lightheadedness and shortness of breath?No. No SOB but "kinda" light headed at times   3. Have you checked your BP and heart rate? (document readings) BP seems to be OK!  4. Are you experiencing any other symptoms? Sometimes weakness in legs.

## 2015-09-06 NOTE — Telephone Encounter (Signed)
Follow up     Pt calling back about palpitations and possibly being seen by doctor on Wednesday

## 2015-09-08 ENCOUNTER — Encounter: Payer: Self-pay | Admitting: Cardiology

## 2015-09-08 ENCOUNTER — Ambulatory Visit (INDEPENDENT_AMBULATORY_CARE_PROVIDER_SITE_OTHER): Payer: Medicare Other | Admitting: Cardiology

## 2015-09-08 VITALS — BP 109/71 | HR 58 | Ht 65.0 in | Wt 116.0 lb

## 2015-09-08 DIAGNOSIS — R002 Palpitations: Secondary | ICD-10-CM | POA: Diagnosis not present

## 2015-09-08 NOTE — Patient Instructions (Addendum)
Medication Instructions:  Your physician recommends that you continue on your current medications as directed. Please refer to the Current Medication list given to you today.  Follow-Up: Follow up in 6 months with Dr. Hochrein.  You will receive a letter in the mail 2 months before you are due.  Please call us when you receive this letter to schedule your follow up appointment.  Thank you for choosing [facility]!!       

## 2015-09-08 NOTE — Telephone Encounter (Signed)
Pt given an appt at 10:15 today

## 2015-09-08 NOTE — Progress Notes (Signed)
    HPI The patient presents for followup of palpitations. She called to be added to the schedule because of increasing palpitations. She's been under increased stress as her friend has been ill. She feels her palpitations more frequently but still she does not take her Inderal. She feels anxious and she says that sometimes she takes her Xanax. She is describing some mild orthostatic symptoms. Otherwise she's not having any presyncope or syncope. She still walking 2-3 miles a day. She has no new chest pressure, neck or arm discomfort. She has no new shortness of breath, PND or orthopnea.   Allergies  Allergen Reactions  . Cephalexin   . Penicillins   . Statins     myalgias    Current Outpatient Prescriptions  Medication Sig Dispense Refill  . cholecalciferol (VITAMIN D) 1000 UNITS tablet Take 1,000 Units by mouth daily.    . Probiotic Product (ALIGN PO) Take 1 capsule by mouth daily.    . propranolol (INDERAL) 20 MG tablet Take 20 mg by mouth as needed (for palpitations).     . trimethoprim (TRIMPEX) 100 MG tablet TAKE 1 TABLET AT BEDTIME OR POST COITALLY 30 tablet 0  . XANAX 0.25 MG tablet TAKE (1/2) TABLET THREE TIMES DAILY. (Patient taking differently: TAKE (1/2) TABLET THREE TIMES AS NEEDED.) 50 tablet 1  . aspirin 81 MG tablet Take 81 mg by mouth daily.     No current facility-administered medications for this visit.    Past Medical History  Diagnosis Date  . Hyperlipidemia     Mild  . Palpitations   . Anxiety   . Osteoporosis     Past Surgical History  Procedure Laterality Date  . Cystectomy      Breast  . Oophorectomy    . Eye surgery      ROS:  As stated in the HPI and negative for all other systems.  09/08/2015   PHYSICAL EXAM BP 109/71 mmHg  Pulse 58  Ht 5\' 5"  (1.651 m)  Wt 116 lb (52.617 kg)  BMI 19.30 kg/m2 GENERAL:  Well appearing, thin NECK:  No jugular venous distention, waveform within normal limits, carotid upstroke brisk and symmetric, no bruits,  no thyromegaly LUNGS:  Clear to auscultation bilaterally HEART:  PMI not displaced or sustained,S1 and S2 within normal limits, no S3, no S4, no clicks, no rubs, no murmurs ABD:  Flat, positive bowel sounds normal in frequency in pitch, no bruits, no rebound, no guarding, no midline pulsatile mass, no hepatomegaly, no splenomegaly EXT:  2 plus pulses throughout, no edema, no cyanosis no clubbing PSYCH:  Tearful  EKG:  Sinus rhythm, rate 58, axis within normal limits, intervals within normal limits, no acute ST-T wave changes.  09/08/2015  ASSESSMENT AND PLAN  PALPITATIONS:  We discussed when necessary treatment with Inderal and Xanax. We discussed avoidance of all caffeine which she has not been doing.  DYSLIPIDEMIA:  This is followed by Dr. Laurance Flatten.  She is due to have these checked again.   Her LDL was recently 131.  However, she did not want to take a statin. I think with her medicine sensitivities and her 0 calcium score previously that I would avoid a statin.

## 2015-11-12 ENCOUNTER — Encounter: Payer: Self-pay | Admitting: Family Medicine

## 2015-11-12 ENCOUNTER — Ambulatory Visit (INDEPENDENT_AMBULATORY_CARE_PROVIDER_SITE_OTHER): Payer: Medicare Other | Admitting: Family Medicine

## 2015-11-12 VITALS — BP 121/61 | HR 61 | Temp 97.2°F | Ht 65.0 in | Wt 119.0 lb

## 2015-11-12 DIAGNOSIS — Z6820 Body mass index (BMI) 20.0-20.9, adult: Secondary | ICD-10-CM

## 2015-11-12 DIAGNOSIS — D696 Thrombocytopenia, unspecified: Secondary | ICD-10-CM

## 2015-11-12 DIAGNOSIS — I959 Hypotension, unspecified: Secondary | ICD-10-CM | POA: Diagnosis not present

## 2015-11-12 DIAGNOSIS — M810 Age-related osteoporosis without current pathological fracture: Secondary | ICD-10-CM | POA: Diagnosis not present

## 2015-11-12 DIAGNOSIS — E559 Vitamin D deficiency, unspecified: Secondary | ICD-10-CM | POA: Diagnosis not present

## 2015-11-12 DIAGNOSIS — E785 Hyperlipidemia, unspecified: Secondary | ICD-10-CM | POA: Diagnosis not present

## 2015-11-12 NOTE — Patient Instructions (Addendum)
Medicare Annual Wellness Visit  [facility] and the medical providers at [facility] strive to bring you the best medical care.  In doing so we not only want to address your current medical conditions and concerns but also to detect new conditions early and prevent illness, disease and health-related problems.    Medicare offers a yearly Wellness Visit which allows our clinical staff to assess your need for preventative services including immunizations, lifestyle education, counseling to decrease risk of preventable diseases and screening for fall risk and other medical concerns.    This visit is provided free of charge (no copay) for all Medicare recipients. The clinical pharmacists at [facility] have begun to conduct these Wellness Visits which will also include a thorough review of all your medications.    As you primary medical provider recommend that you make an appointment for your Annual Wellness Visit if you have not done so already this year.  You may set up this appointment before you leave today or you may call back [phone] and schedule an appointment.  Please make sure when you call that you mention that you are scheduling your Annual Wellness Visit with the clinical pharmacist so that the appointment may be made for the proper length of time.     Continue current medications. Continue good therapeutic lifestyle changes which include good diet and exercise. Fall precautions discussed with patient. If an FOBT was given today- please return it to our front desk. If you are over 57 years old - you may need Prevnar 45 or the adult Pneumonia vaccine.  **Flu shots are available--- please call and schedule a FLU-CLINIC appointment**  After your visit with [place] today you will receive a survey in the mail or online from [employer] regarding your care with [place]. Please take a moment to fill this out. Your feedback is very  important to [place] as you can help [place] better understand your patient needs as well as improve your experience and satisfaction. WE CARE ABOUT YOU!!!   Please do not forget to get your mammogram Return the FOBT Continue to drink plenty of fluids and keep yourself well hydrated especially this winter Try wearing some stronger support hose that you have put on soon as you get out of bed in the morning especially this winter and see if it helps control your fluid retention better which may be causing you to go to the bathroom more at nighttime Elevate the feet for 15 or 20 minutes during the day higher than the level of your heart and this can also help control the fluid retention that your body has and hopefully some of the problems you have been going to the bathroom more at nighttime We will arrange for you to have a pelvic exam with Dr. Evette Doffing Continue to eat healthy and stay active physically both for your physical health and your emotional health

## 2015-11-12 NOTE — Progress Notes (Signed)
Subjective:    Patient ID: Rose [facility], female    DOB: May 02, 1935, 80 y.o.   MRN: [id number]  HPI Pt here for follow up and management of chronic medical problems which includes hyperlipidemia. She is taking medications regularly. The patient is still dealing emotionally with a loss of a special friend and her partner. She denies chest pain or shortness of breath. She does have occasional indigestion but this is getting better. She has no trouble swallowing and there is no blood in the stool or black tarry bowel movements. She does have to go to the bathroom 3 or 4 times during the night. She does not drink a lot of fluids in the evening. She has seen the urologist and he is not planning any return visits with her. She does see the cardiologist every 6 months. She is requesting an additional visit just to discuss the emotional aspects of her recent loss and my nurse will arrange that for her in the future.       Patient Active Problem List   Diagnosis Date Noted  . Thrombocytopenia ([place]) 07/16/2015  . Adjustment disorder with anxious mood 06/03/2015  . Vitamin D deficiency 06/03/2015  . Hyperlipidemia 06/03/2015  . Osteoporosis 01/20/2015  . Recurrent urinary tract infection 09/03/2013  . Chest pain 08/17/2011  . Preventive measure 08/16/2011  . HYPOTENSION, UNSPECIFIED 07/13/2010  . NAUSEA 06/09/2010  . MITRAL REGURGITATION 02/02/2010  . PALPITATIONS 02/02/2010  . HYPERLIPIDEMIA, MILD 01/28/2010   Outpatient Encounter Prescriptions as of 11/12/2015  Medication Sig  . aspirin 81 MG tablet Take 81 mg by mouth daily.  . cholecalciferol (VITAMIN D) 1000 UNITS tablet Take 1,000 Units by mouth daily.  . Probiotic Product (ALIGN PO) Take 1 capsule by mouth daily.  . propranolol (INDERAL) 20 MG tablet Take 20 mg by mouth as needed (for palpitations).   . trimethoprim (TRIMPEX) 100 MG tablet TAKE 1 TABLET AT BEDTIME OR POST COITALLY  . XANAX 0.25 MG tablet TAKE (1/2) TABLET THREE  TIMES DAILY. (Patient taking differently: TAKE (1/2) TABLET THREE TIMES AS NEEDED.)   No facility-administered encounter medications on file as of 11/12/2015.     Review of Systems  Constitutional: Negative.   HENT: Negative.   Eyes: Negative.   Respiratory: Negative.   Cardiovascular: Negative.   Gastrointestinal: Negative.   Endocrine: Negative.   Genitourinary: Negative.   Musculoskeletal: Negative.   Skin: Negative.        Sore place on left side  Allergic/Immunologic: Negative.   Neurological: Negative.   Hematological: Negative.   Psychiatric/Behavioral: Negative.        Objective:   Physical Exam  Constitutional: She is oriented to person, place, and time. She appears well-developed and well-nourished. No distress.  HENT:  Head: Normocephalic and atraumatic.  Right Ear: External ear normal.  Left Ear: External ear normal.  Mouth/Throat: Oropharynx is clear and moist. No oropharyngeal exudate.  Slight nasal congestion bilaterally  Eyes: Conjunctivae and EOM are normal. Pupils are equal, round, and reactive to light. Right eye exhibits no discharge. Left eye exhibits no discharge. No scleral icterus.  She is up-to-date on her eye exams  Neck: Normal range of motion. Neck supple. No thyromegaly present.  Cardiovascular: Normal rate, regular rhythm, normal heart sounds and intact distal pulses.  Exam reveals no gallop and no friction rub.   No murmur heard. The heart has a regular rate and rhythm at 72/m  Pulmonary/Chest: Effort normal and breath sounds normal. No respiratory distress.  She has no wheezes. She has no rales. She exhibits no tenderness.  Lungs are clear anteriorly and posteriorly  Abdominal: Soft. Bowel sounds are normal. She exhibits no mass. There is no tenderness. There is no rebound and no guarding.  No abdominal tenderness or bruits or organ enlargement  Musculoskeletal: Normal range of motion. She exhibits no edema.  Lymphadenopathy:    She has no  cervical adenopathy.  Neurological: She is alert and oriented to person, place, and time. She has normal reflexes. No cranial nerve deficit.  Skin: Skin is warm and dry. No rash noted.  The patient does have a small sebaceous cyst beneath the left breast she has an appointment coming up with her dermatologist soon to address this.  Psychiatric: She has a normal mood and affect. Her behavior is normal. Judgment and thought content normal.  The patient seems to be doing well following the loss of her special friend.  Nursing note and vitals reviewed.  BP 121/61 mmHg  Pulse 61  Temp(Src) 97.2 F (36.2 C) (Oral)  Ht _0  (1.651 m)  Wt 119 lb (53.978 kg)  BMI 19.80 kg/m2        Assessment & Plan:  1. Vitamin D deficiency -Continue current vitamin D replacement pending results of lab work - CBC with Differential/Platelet - VITAMIN D 25 Hydroxy (Vit-D Deficiency, Fractures)  2. Hyperlipidemia -Continue with aggressive therapeutic lifestyle changes which include diet and exercise - BMP8+EGFR - CBC with Differential/Platelet - Hepatic function panel - NMR, lipoprofile  3. Hypotension, unspecified hypotension type -The blood pressure is good today and she will continue with her propranolol. - BMP8+EGFR - CBC with Differential/Platelet - Hepatic function panel  4. Thrombocytopenia ([place]) -The patient describes no bleeding problems.  5. Osteoporosis -She needs to continue to walk and exercise regularly and take her vitamin D.  6. Body mass index (BMI) of 20.0-20.9 in adult -She will continue to try to eat healthy which she says that she does already and get plenty of exercise.  Patient Instructions                       Medicare Annual Wellness Visit  [facility] and the medical providers at [facility] strive to bring you the best medical care.  In doing so we not only want to address your current medical conditions and concerns but also to detect new  conditions early and prevent illness, disease and health-related problems.    Medicare offers a yearly Wellness Visit which allows our clinical staff to assess your need for preventative services including immunizations, lifestyle education, counseling to decrease risk of preventable diseases and screening for fall risk and other medical concerns.    This visit is provided free of charge (no copay) for all Medicare recipients. The clinical pharmacists at [facility] have begun to conduct these Wellness Visits which will also include a thorough review of all your medications.    As you primary medical provider recommend that you make an appointment for your Annual Wellness Visit if you have not done so already this year.  You may set up this appointment before you leave today or you may call back [phone]) and schedule an appointment.  Please make sure when you call that you mention that you are scheduling your Annual Wellness Visit with the clinical pharmacist so that the appointment may be made for the proper length of time.     Continue current medications.  Continue good therapeutic lifestyle changes which include good diet and exercise. Fall precautions discussed with patient. If an FOBT was given today- please return it to our front desk. If you are over 66 years old - you may need Prevnar 22 or the adult Pneumonia vaccine.  **Flu shots are available--- please call and schedule a FLU-CLINIC appointment**  After your visit with [place] today you will receive a survey in the mail or online from [employer] regarding your care with [place]. Please take a moment to fill this out. Your feedback is very important to [place] as you can help [place] better understand your patient needs as well as improve your experience and satisfaction. WE CARE ABOUT YOU!!!   Please do not forget to get your mammogram Return the FOBT Continue to drink plenty of fluids and keep yourself well hydrated especially  this winter Try wearing some stronger support hose that you have put on soon as you get out of bed in the morning especially this winter and see if it helps control your fluid retention better which may be causing you to go to the bathroom more at nighttime Elevate the feet for 15 or 20 minutes during the day higher than the level of your heart and this can also help control the fluid retention that your body has and hopefully some of the problems you have been going to the bathroom more at nighttime We will arrange for you to have a pelvic exam with Dr. Evette Doffing Continue to eat healthy and stay active physically both for your physical health and your emotional health   Arrie Senate MD

## 2015-11-13 LAB — CBC WITH DIFFERENTIAL/PLATELET
Basophils Absolute: 0 10*3/uL (ref 0.0–0.2)
Basos: 0 %
EOS (ABSOLUTE): 0.1 10*3/uL (ref 0.0–0.4)
EOS: 2 %
HEMATOCRIT: 40.8 % (ref 34.0–46.6)
Hemoglobin: 13.4 g/dL (ref 11.1–15.9)
IMMATURE GRANS (ABS): 0 10*3/uL (ref 0.0–0.1)
Immature Granulocytes: 0 %
LYMPHS ABS: 1.5 10*3/uL (ref 0.7–3.1)
Lymphs: 30 %
MCH: 33.6 pg — AB (ref 26.6–33.0)
MCHC: 32.8 g/dL (ref 31.5–35.7)
MCV: 102 fL — ABNORMAL HIGH (ref 79–97)
Monocytes Absolute: 0.5 10*3/uL (ref 0.1–0.9)
Monocytes: 10 %
Neutrophils Absolute: 2.9 10*3/uL (ref 1.4–7.0)
Neutrophils: 58 %
Platelets: 185 10*3/uL (ref 150–379)
RBC: 3.99 x10E6/uL (ref 3.77–5.28)
RDW: 12.7 % (ref 12.3–15.4)
WBC: 5 10*3/uL (ref 3.4–10.8)

## 2015-11-13 LAB — NMR, LIPOPROFILE
Cholesterol: 216 mg/dL — ABNORMAL HIGH (ref 100–199)
HDL Cholesterol by NMR: 73 mg/dL (ref >39)
HDL PARTICLE NUMBER: 34.3 umol/L (ref >=30.5)
LDL PARTICLE NUMBER: 1320 nmol/L — AB (ref <1000)
LDL Size: 21.5 nm (ref >20.5)
LDL-C: 127 mg/dL — AB (ref 0–99)
Small LDL Particle Number: 206 nmol/L (ref <=527)
Triglycerides by NMR: 82 mg/dL (ref 0–149)

## 2015-11-13 LAB — BMP8+EGFR
BUN / CREAT RATIO: 28 — AB (ref 11–26)
BUN: 16 mg/dL (ref 8–27)
CO2: 24 mmol/L (ref 18–29)
CREATININE: 0.58 mg/dL (ref 0.57–1.00)
Calcium: 9.2 mg/dL (ref 8.7–10.3)
Chloride: 104 mmol/L (ref 97–106)
GFR calc Af Amer: 102 mL/min/{1.73_m2} (ref >59)
GFR, EST NON AFRICAN AMERICAN: 89 mL/min/{1.73_m2} (ref >59)
Glucose: 84 mg/dL (ref 65–99)
Potassium: 5.6 mmol/L — ABNORMAL HIGH (ref 3.5–5.2)
Sodium: 146 mmol/L — ABNORMAL HIGH (ref 136–144)

## 2015-11-13 LAB — VITAMIN D 25 HYDROXY (VIT D DEFICIENCY, FRACTURES): Vit D, 25-Hydroxy: 33.5 ng/mL (ref 30.0–100.0)

## 2015-11-13 LAB — HEPATIC FUNCTION PANEL
ALBUMIN: 4.2 g/dL (ref 3.5–4.8)
ALK PHOS: 74 IU/L (ref 39–117)
ALT: 14 IU/L (ref 0–32)
AST: 14 IU/L (ref 0–40)
Bilirubin Total: 0.5 mg/dL (ref 0.0–1.2)
Bilirubin, Direct: 0.12 mg/dL (ref 0.00–0.40)
Total Protein: 6.4 g/dL (ref 6.0–8.5)

## 2015-11-15 ENCOUNTER — Other Ambulatory Visit: Payer: Medicare Other

## 2015-11-15 ENCOUNTER — Ambulatory Visit: Payer: Medicare Other | Admitting: Family Medicine

## 2015-11-15 DIAGNOSIS — R799 Abnormal finding of blood chemistry, unspecified: Secondary | ICD-10-CM | POA: Diagnosis not present

## 2015-11-16 LAB — BMP8+EGFR
BUN / CREAT RATIO: 22 (ref 11–26)
BUN: 13 mg/dL (ref 8–27)
CO2: 25 mmol/L (ref 18–29)
CREATININE: 0.59 mg/dL (ref 0.57–1.00)
Calcium: 9.3 mg/dL (ref 8.7–10.3)
Chloride: 102 mmol/L (ref 97–106)
GFR calc Af Amer: 102 mL/min/{1.73_m2} (ref >59)
GFR calc non Af Amer: 88 mL/min/{1.73_m2} (ref >59)
GLUCOSE: 98 mg/dL (ref 65–99)
POTASSIUM: 4.8 mmol/L (ref 3.5–5.2)
Sodium: 141 mmol/L (ref 136–144)

## 2015-11-22 ENCOUNTER — Ambulatory Visit: Payer: Medicare Other

## 2015-11-30 DIAGNOSIS — L720 Epidermal cyst: Secondary | ICD-10-CM | POA: Diagnosis not present

## 2015-11-30 DIAGNOSIS — L812 Freckles: Secondary | ICD-10-CM | POA: Diagnosis not present

## 2015-11-30 DIAGNOSIS — D692 Other nonthrombocytopenic purpura: Secondary | ICD-10-CM | POA: Diagnosis not present

## 2015-11-30 DIAGNOSIS — D2261 Melanocytic nevi of right upper limb, including shoulder: Secondary | ICD-10-CM | POA: Diagnosis not present

## 2015-11-30 DIAGNOSIS — L821 Other seborrheic keratosis: Secondary | ICD-10-CM | POA: Diagnosis not present

## 2015-11-30 DIAGNOSIS — L570 Actinic keratosis: Secondary | ICD-10-CM | POA: Diagnosis not present

## 2015-11-30 DIAGNOSIS — L820 Inflamed seborrheic keratosis: Secondary | ICD-10-CM | POA: Diagnosis not present

## 2015-11-30 DIAGNOSIS — D225 Melanocytic nevi of trunk: Secondary | ICD-10-CM | POA: Diagnosis not present

## 2015-12-03 ENCOUNTER — Ambulatory Visit: Payer: Medicare Other | Admitting: Pediatrics

## 2015-12-06 ENCOUNTER — Ambulatory Visit: Payer: Medicare Other | Admitting: Family Medicine

## 2015-12-09 ENCOUNTER — Other Ambulatory Visit: Payer: Self-pay | Admitting: Family Medicine

## 2015-12-17 DIAGNOSIS — Z1231 Encounter for screening mammogram for malignant neoplasm of breast: Secondary | ICD-10-CM | POA: Diagnosis not present

## 2015-12-17 LAB — HM MAMMOGRAPHY

## 2015-12-23 ENCOUNTER — Encounter: Payer: Self-pay | Admitting: *Deleted

## 2015-12-31 DIAGNOSIS — H43811 Vitreous degeneration, right eye: Secondary | ICD-10-CM | POA: Diagnosis not present

## 2015-12-31 DIAGNOSIS — H5201 Hypermetropia, right eye: Secondary | ICD-10-CM | POA: Diagnosis not present

## 2015-12-31 DIAGNOSIS — H2513 Age-related nuclear cataract, bilateral: Secondary | ICD-10-CM | POA: Diagnosis not present

## 2016-01-13 DIAGNOSIS — L601 Onycholysis: Secondary | ICD-10-CM | POA: Diagnosis not present

## 2016-03-07 DIAGNOSIS — H25811 Combined forms of age-related cataract, right eye: Secondary | ICD-10-CM | POA: Diagnosis not present

## 2016-03-07 DIAGNOSIS — H2511 Age-related nuclear cataract, right eye: Secondary | ICD-10-CM | POA: Diagnosis not present

## 2016-03-07 DIAGNOSIS — H2512 Age-related nuclear cataract, left eye: Secondary | ICD-10-CM | POA: Diagnosis not present

## 2016-03-21 ENCOUNTER — Telehealth: Payer: Self-pay | Admitting: Family Medicine

## 2016-03-21 NOTE — Progress Notes (Signed)
HPI The patient presents for followup of palpitations.  Since I last saw her her partner died. This was in 09-11-23. Most recently she lost her cat 14 years. She has been having some increasing palpitations. She's also had anhedonia. She still getting up in doing some things but not social activities.  She feels her palpitations more frequently but still she does not take her Inderal. She feels anxious and she says that sometimes she takes her Xanax. She is describing some mild orthostatic symptoms. Otherwise she's not having any presyncope or syncope. She still walking 2 miles a day. She has no new chest pressure, neck or arm discomfort. She has no new shortness of breath, PND or orthopnea.     Six month follow up  Allergies  Allergen Reactions  . Cephalexin Other (See Comments)    Pt does not remember   . Penicillins Other (See Comments)    Pt's mother was severly allergic, so she has never taken PCN.  . Statins     myalgias    Current Outpatient Prescriptions  Medication Sig Dispense Refill  . aspirin 81 MG tablet Take 81 mg by mouth daily.    . cholecalciferol (VITAMIN D) 1000 UNITS tablet Take 1,000 Units by mouth daily.    . Probiotic Product (ALIGN PO) Take 1 capsule by mouth daily.    Marland Kitchen XANAX 0.25 MG tablet TAKE (1/2) TABLET THREE TIMES DAILY. (Patient taking differently: TAKE (1/2) TABLET THREE TIMES AS NEEDED.) 50 tablet 1  . fluticasone (FLONASE) 50 MCG/ACT nasal spray SPRAY 1 SPRAY IN EACH NOSTRIL ONCE DAILY. (Patient not taking: Reported on 03/22/2016) 16 g 4  . propranolol (INDERAL) 20 MG tablet Take 20 mg by mouth as needed (for palpitations). Reported on 03/22/2016    . trimethoprim (TRIMPEX) 100 MG tablet TAKE 1 TABLET AT BEDTIME OR POST COITALLY (Patient not taking: Reported on 03/22/2016) 30 tablet 0   No current facility-administered medications for this visit.    Past Medical History  Diagnosis Date  . Hyperlipidemia     Mild  . Palpitations   . Anxiety   .  Osteoporosis     Past Surgical History  Procedure Laterality Date  . Cystectomy      Breast  . Oophorectomy    . Eye surgery      ROS:  As stated in the HPI and negative for all other systems.  03/22/2016   PHYSICAL EXAM BP 110/58 mmHg  Pulse 56  Ht 5\' 5"  (1.651 m)  Wt 123 lb (55.792 kg)  BMI 20.47 kg/m2 GENERAL:  Well appearing, thin NECK:  No jugular venous distention, waveform within normal limits, carotid upstroke brisk and symmetric, no bruits, no thyromegaly LUNGS:  Clear to auscultation bilaterally HEART:  PMI not displaced or sustained,S1 and S2 within normal limits, no S3, no S4, no clicks, no rubs, no murmurs ABD:  Flat, positive bowel sounds normal in frequency in pitch, no bruits, no rebound, no guarding, no midline pulsatile mass, no hepatomegaly, no splenomegaly EXT:  2 plus pulses throughout, no edema, no cyanosis no clubbing PSYCH:  Tearful  EKG:  Sinus rhythm, rate 59, axis within normal limits, intervals within normal limits, no acute ST-T wave changes.  03/22/2016  ASSESSMENT AND PLAN  PALPITATIONS:  We discussed when necessary treatment with Inderal and Xanax.  I think many of her complaints are probably related to depression. She is open to therapy for this. She has an appointment later this week to see Dr.  Moore.  DYSLIPIDEMIA:  This is followed by Dr. Laurance Flatten.  She is due to have these checked again.   Her LDL was recently 131.  However, she did not want to take a statin. I think with her medicine sensitivities and her 0 calcium score previously that I would avoid a statin.

## 2016-03-22 ENCOUNTER — Encounter: Payer: Self-pay | Admitting: Cardiology

## 2016-03-22 ENCOUNTER — Ambulatory Visit (INDEPENDENT_AMBULATORY_CARE_PROVIDER_SITE_OTHER): Payer: Medicare Other | Admitting: Cardiology

## 2016-03-22 VITALS — BP 110/58 | HR 56 | Ht 65.0 in | Wt 123.0 lb

## 2016-03-22 DIAGNOSIS — R002 Palpitations: Secondary | ICD-10-CM | POA: Diagnosis not present

## 2016-03-22 NOTE — Patient Instructions (Signed)
Medication Instructions:  The current medical regimen is effective;  continue present plan and medications.  Follow-Up: Follow up in 6 months with Dr. Hochrein.  You will receive a letter in the mail 2 months before you are due.  Please call us when you receive this letter to schedule your follow up appointment.  If you need a refill on your cardiac medications before your next appointment, please call your pharmacy.  Thank you for choosing [facility]!!       

## 2016-03-24 ENCOUNTER — Encounter: Payer: Self-pay | Admitting: Family Medicine

## 2016-03-24 ENCOUNTER — Ambulatory Visit (INDEPENDENT_AMBULATORY_CARE_PROVIDER_SITE_OTHER): Payer: Medicare Other | Admitting: Family Medicine

## 2016-03-24 ENCOUNTER — Encounter (INDEPENDENT_AMBULATORY_CARE_PROVIDER_SITE_OTHER): Payer: Self-pay

## 2016-03-24 VITALS — BP 107/62 | HR 62 | Temp 97.4°F | Ht 65.0 in | Wt 121.0 lb

## 2016-03-24 DIAGNOSIS — R5383 Other fatigue: Secondary | ICD-10-CM

## 2016-03-24 DIAGNOSIS — R002 Palpitations: Secondary | ICD-10-CM

## 2016-03-24 DIAGNOSIS — E559 Vitamin D deficiency, unspecified: Secondary | ICD-10-CM | POA: Diagnosis not present

## 2016-03-24 DIAGNOSIS — I959 Hypotension, unspecified: Secondary | ICD-10-CM | POA: Diagnosis not present

## 2016-03-24 DIAGNOSIS — E785 Hyperlipidemia, unspecified: Secondary | ICD-10-CM | POA: Diagnosis not present

## 2016-03-24 DIAGNOSIS — F4323 Adjustment disorder with mixed anxiety and depressed mood: Secondary | ICD-10-CM | POA: Diagnosis not present

## 2016-03-24 DIAGNOSIS — D696 Thrombocytopenia, unspecified: Secondary | ICD-10-CM | POA: Diagnosis not present

## 2016-03-24 NOTE — Patient Instructions (Addendum)
Medicare Annual Wellness Visit  [facility] and the medical providers at [facility] strive to bring you the best medical care.  In doing so we not only want to address your current medical conditions and concerns but also to detect new conditions early and prevent illness, disease and health-related problems.    Medicare offers a yearly Wellness Visit which allows our clinical staff to assess your need for preventative services including immunizations, lifestyle education, counseling to decrease risk of preventable diseases and screening for fall risk and other medical concerns.    This visit is provided free of charge (no copay) for all Medicare recipients. The clinical pharmacists at [facility] have begun to conduct these Wellness Visits which will also include a thorough review of all your medications.    As you primary medical provider recommend that you make an appointment for your Annual Wellness Visit if you have not done so already this year.  You may set up this appointment before you leave today or you may call back [phone] and schedule an appointment.  Please make sure when you call that you mention that you are scheduling your Annual Wellness Visit with the clinical pharmacist so that the appointment may be made for the proper length of time.     Continue current medications. Continue good therapeutic lifestyle changes which include good diet and exercise. Fall precautions discussed with patient. If an FOBT was given today- please return it to our front desk. If you are over 13 years old - you may need Prevnar 24 or the adult Pneumonia vaccine.  **Flu shots are available--- please call and schedule a FLU-CLINIC appointment**  After your visit with [place] today you will receive a survey in the mail or online from [employer] regarding your care with [place]. Please take a moment to fill this out. Your feedback is very  important to [place] as you can help [place] better understand your patient needs as well as improve your experience and satisfaction. WE CARE ABOUT YOU!!!     Take the inderal / propranolol more regularly Return fasting for your labwork Let us know how this goes and if we need to try something different Continue to follow-up with ophthalmology Drink plenty of fluids and be careful not to put yourself at risk for falling
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
## 2016-03-28 DIAGNOSIS — H6121 Impacted cerumen, right ear: Secondary | ICD-10-CM | POA: Diagnosis not present

## 2016-03-30 ENCOUNTER — Other Ambulatory Visit: Payer: Medicare Other

## 2016-03-30 DIAGNOSIS — R5383 Other fatigue: Secondary | ICD-10-CM | POA: Diagnosis not present

## 2016-03-30 DIAGNOSIS — D696 Thrombocytopenia, unspecified: Secondary | ICD-10-CM

## 2016-03-30 DIAGNOSIS — I959 Hypotension, unspecified: Secondary | ICD-10-CM

## 2016-03-30 DIAGNOSIS — F4323 Adjustment disorder with mixed anxiety and depressed mood: Secondary | ICD-10-CM | POA: Diagnosis not present

## 2016-03-30 DIAGNOSIS — E559 Vitamin D deficiency, unspecified: Secondary | ICD-10-CM | POA: Diagnosis not present

## 2016-03-30 DIAGNOSIS — E785 Hyperlipidemia, unspecified: Secondary | ICD-10-CM | POA: Diagnosis not present

## 2016-03-30 DIAGNOSIS — R002 Palpitations: Secondary | ICD-10-CM

## 2016-03-31 LAB — BMP8+EGFR
BUN/Creatinine Ratio: 24 (ref 12–28)
BUN: 15 mg/dL (ref 8–27)
CO2: 24 mmol/L (ref 18–29)
CREATININE: 0.62 mg/dL (ref 0.57–1.00)
Calcium: 9 mg/dL (ref 8.7–10.3)
Chloride: 104 mmol/L (ref 96–106)
GFR calc Af Amer: 100 mL/min/{1.73_m2} (ref >59)
GFR calc non Af Amer: 87 mL/min/{1.73_m2} (ref >59)
GLUCOSE: 99 mg/dL (ref 65–99)
Potassium: 4.8 mmol/L (ref 3.5–5.2)
Sodium: 144 mmol/L (ref 134–144)

## 2016-03-31 LAB — HEPATIC FUNCTION PANEL
ALBUMIN: 4.1 g/dL (ref 3.5–4.8)
ALK PHOS: 74 IU/L (ref 39–117)
ALT: 10 IU/L (ref 0–32)
AST: 13 IU/L (ref 0–40)
BILIRUBIN TOTAL: 0.3 mg/dL (ref 0.0–1.2)
Bilirubin, Direct: 0.09 mg/dL (ref 0.00–0.40)
Total Protein: 6.4 g/dL (ref 6.0–8.5)

## 2016-03-31 LAB — CBC WITH DIFFERENTIAL/PLATELET
Basophils Absolute: 0 10*3/uL (ref 0.0–0.2)
Basos: 1 %
EOS (ABSOLUTE): 0.1 10*3/uL (ref 0.0–0.4)
EOS: 2 %
HEMATOCRIT: 41.5 % (ref 34.0–46.6)
HEMOGLOBIN: 13.7 g/dL (ref 11.1–15.9)
IMMATURE GRANS (ABS): 0 10*3/uL (ref 0.0–0.1)
IMMATURE GRANULOCYTES: 0 %
Lymphocytes Absolute: 1.4 10*3/uL (ref 0.7–3.1)
Lymphs: 31 %
MCH: 33.3 pg — ABNORMAL HIGH (ref 26.6–33.0)
MCHC: 33 g/dL (ref 31.5–35.7)
MCV: 101 fL — ABNORMAL HIGH (ref 79–97)
MONOCYTES: 8 %
Monocytes Absolute: 0.4 10*3/uL (ref 0.1–0.9)
NEUTROS PCT: 58 %
Neutrophils Absolute: 2.6 10*3/uL (ref 1.4–7.0)
Platelets: 183 10*3/uL (ref 150–379)
RBC: 4.11 x10E6/uL (ref 3.77–5.28)
RDW: 13.3 % (ref 12.3–15.4)
WBC: 4.6 10*3/uL (ref 3.4–10.8)

## 2016-03-31 LAB — NMR, LIPOPROFILE
Cholesterol: 228 mg/dL — ABNORMAL HIGH (ref 100–199)
HDL CHOLESTEROL BY NMR: 69 mg/dL (ref >39)
HDL Particle Number: 31.4 umol/L (ref >=30.5)
LDL Particle Number: 1691 nmol/L — ABNORMAL HIGH (ref <1000)
LDL Size: 21.7 nm (ref >20.5)
LDL-C: 145 mg/dL — ABNORMAL HIGH (ref 0–99)
LP-IR Score: <25 (ref <=45)
Small LDL Particle Number: 379 nmol/L (ref <=527)
Triglycerides by NMR: 69 mg/dL (ref 0–149)

## 2016-03-31 LAB — THYROID PANEL WITH TSH
FREE THYROXINE INDEX: 2.5 (ref 1.2–4.9)
T3 Uptake Ratio: 31 % (ref 24–39)
T4, Total: 8.1 ug/dL (ref 4.5–12.0)
TSH: 1.61 u[IU]/mL (ref 0.450–4.500)

## 2016-03-31 LAB — VITAMIN D 25 HYDROXY (VIT D DEFICIENCY, FRACTURES): VIT D 25 HYDROXY: 42.9 ng/mL (ref 30.0–100.0)

## 2016-04-08 DIAGNOSIS — H2000 Unspecified acute and subacute iridocyclitis: Secondary | ICD-10-CM | POA: Diagnosis not present

## 2016-04-12 ENCOUNTER — Other Ambulatory Visit: Payer: Self-pay | Admitting: Family Medicine

## 2016-04-12 NOTE — Telephone Encounter (Signed)
Last filled 08/13/15, last seen 03/24/16. Call in at [facility]

## 2016-05-18 ENCOUNTER — Ambulatory Visit: Payer: Medicare Other | Admitting: Family Medicine

## 2016-05-24 ENCOUNTER — Encounter: Payer: Self-pay | Admitting: Family Medicine

## 2016-05-24 ENCOUNTER — Ambulatory Visit (INDEPENDENT_AMBULATORY_CARE_PROVIDER_SITE_OTHER): Payer: Medicare Other | Admitting: Family Medicine

## 2016-05-24 ENCOUNTER — Ambulatory Visit (INDEPENDENT_AMBULATORY_CARE_PROVIDER_SITE_OTHER): Payer: Medicare Other

## 2016-05-24 VITALS — BP 115/69 | HR 60 | Temp 97.4°F | Ht 65.0 in | Wt 124.0 lb

## 2016-05-24 DIAGNOSIS — R29898 Other symptoms and signs involving the musculoskeletal system: Secondary | ICD-10-CM | POA: Diagnosis not present

## 2016-05-24 DIAGNOSIS — E559 Vitamin D deficiency, unspecified: Secondary | ICD-10-CM

## 2016-05-24 DIAGNOSIS — M79674 Pain in right toe(s): Secondary | ICD-10-CM

## 2016-05-24 DIAGNOSIS — E785 Hyperlipidemia, unspecified: Secondary | ICD-10-CM | POA: Diagnosis not present

## 2016-05-24 DIAGNOSIS — D696 Thrombocytopenia, unspecified: Secondary | ICD-10-CM

## 2016-05-24 DIAGNOSIS — R351 Nocturia: Secondary | ICD-10-CM | POA: Diagnosis not present

## 2016-05-24 DIAGNOSIS — R5383 Other fatigue: Secondary | ICD-10-CM | POA: Diagnosis not present

## 2016-05-24 LAB — URINALYSIS, COMPLETE
Bilirubin, UA: NEGATIVE
Glucose, UA: NEGATIVE
KETONES UA: NEGATIVE
NITRITE UA: NEGATIVE
PH UA: 6 (ref 5.0–7.5)
Protein, UA: NEGATIVE
RBC, UA: NEGATIVE
Specific Gravity, UA: 1.01 (ref 1.005–1.030)
Urobilinogen, Ur: 0.2 mg/dL (ref 0.2–1.0)

## 2016-05-24 LAB — MICROSCOPIC EXAMINATION

## 2016-05-24 NOTE — Patient Instructions (Signed)
Drink plenty of fluids and stay well hydrated Wear a commercial pair of support hose daily and the should be put on the lower extremities the first thing upon arising in the morning Wear these regularly for a couple weeks and see if your feet do not feel better as far as the sensation that you're having on the bottom of the feet. This may also help any discomfort you're having in her joints. Watch sodium intake We will call with lab work results and x-ray results as soon as they become available

## 2016-05-24 NOTE — Addendum Note (Signed)
Addended by: Zannie Cove on: 05/24/2016 05:01 PM   Modules accepted: Orders

## 2016-05-24 NOTE — Progress Notes (Signed)
 Subjective:    Patient ID: Rose Martinez, female    DOB: 04/29/1935, 81 y.o.   MRN: [id number]  HPI Patient here today for leg weakness and right great toe pain at times. The patient complains of the arthralgias in her right great to and has weakness in her legs. She also has some increased nocturia. The patient does not have any burning when she passes her water she just has to go to the bathroom and it may be as many as 5 times nightly. She says that she just cannot drink anything after lunch every day. If she does, she will be up going to the bathroom all night. She also complains with some off and on DIP joint pain in the right great toe. Is not been any swelling or redness or fever. She also complains of a sensation of this different on the bottom of both feet. It's not pain is just a funny sensation on the bottom of both feet.     Patient Active Problem List   Diagnosis Date Noted  . Thrombocytopenia ([facility]) 07/16/2015  . Adjustment disorder with mixed anxiety and depressed mood 06/03/2015  . Vitamin D deficiency 06/03/2015  . Hyperlipidemia 06/03/2015  . Osteoporosis 01/20/2015  . Recurrent urinary tract infection 09/03/2013  . Chest pain 08/17/2011  . Preventive measure 08/16/2011  . HYPOTENSION, UNSPECIFIED 07/13/2010  . MITRAL REGURGITATION 02/02/2010  . PALPITATIONS 02/02/2010   Outpatient Encounter Prescriptions as of 05/24/2016  Medication Sig  . ALPRAZolam (XANAX) 0.25 MG tablet TAKE (1/2) TABLET THREE TIMES DAILY.  . aspirin 81 MG tablet Take 81 mg by mouth daily.  . cholecalciferol (VITAMIN D) 1000 UNITS tablet Take 1,000 Units by mouth daily.  . Probiotic Product (ALIGN PO) Take 1 capsule by mouth daily.  . propranolol (INDERAL) 20 MG tablet Take 20 mg by mouth as needed (for palpitations). Reported on 03/22/2016   No facility-administered encounter medications on file as of 05/24/2016.      Review of Systems  HENT: Negative.   Eyes: Negative.   Respiratory:  Negative.   Cardiovascular: Negative.   Gastrointestinal: Negative.   Endocrine: Negative.   Genitourinary: Negative.   Musculoskeletal: Positive for arthralgias (right great toe pain ).  Skin: Negative.   Allergic/Immunologic: Negative.   Neurological: Positive for weakness (legs).  Hematological: Negative.   Psychiatric/Behavioral: Negative.        Objective:   Physical Exam  Constitutional: She is oriented to person, place, and time. She appears well-developed and well-nourished. No distress.  HENT:  Head: Normocephalic and atraumatic.  Eyes: Conjunctivae and EOM are normal. Pupils are equal, round, and reactive to light. Right eye exhibits no discharge. Left eye exhibits no discharge.  Neck: Normal range of motion.  Abdominal: Soft. Bowel sounds are normal. She exhibits no distension. There is no tenderness. There is no guarding.  Musculoskeletal: Normal range of motion. She exhibits no edema or tenderness.  Neurological: She is alert and oriented to person, place, and time.  Skin: Skin is warm and dry. No rash noted. No erythema.  Psychiatric: She has a normal mood and affect. Her behavior is normal. Judgment and thought content normal.  Nursing note and vitals reviewed.   BP 115/69 mmHg  Pulse 60  Temp(Src) 97.4 F (36.3 C) (Oral)  Ht 5' 5" (1.651 m)  Wt 124 lb (56.246 kg)  BMI 20.63 kg/m2  [facility] reading (PRIMARY) by  Dr.Moore-x-ray of right foot and great toe are pending                                         Assessment & Plan:  1. Weakness of lower extremity, unspecified laterality -Patient has good pulses to both feet. The weakness is actually more of a sensation uncomfortable this to the bottom of each foot. -I have recommended that she gets a good pair of commercial support hose that she puts on with first arising in the morning. -She does have some varicosities of both ankles and feet and better support may help the sensations that she is having in her feet.  She is willing to try this to see if that helps. - BMP8+EGFR - Uric acid - Vitamin B12 - Thyroid Panel With TSH - CBC with Differential/Platelet  2. Great toe pain, right -There is no redness erythema or warmth and minimal if any swelling - BMP8+EGFR - Uric acid - Vitamin B12 - Thyroid Panel With TSH - CBC with Differential/Platelet - DG Foot Complete Right; Future  3. Nocturia -She denies any burning only nocturia. It may be as much as 4 or 5 times. -Use support hose as directed and see if this makes a difference with her nocturia - Urinalysis, Complete - CBC with Differential/Platelet  4. Thrombocytopenia ([facility]) - CBC with Differential/Platelet  5. Hyperlipidemia -The patient says she is willing to try a low-dose of a statin drug. We will wait until all the lab work is returned before we initiate something like a Crestor 2-1/2 mg on Monday Wednesday and Friday at bedtime. - CBC with Differential/Platelet  6. Vitamin D deficiency - CBC with Differential/Platelet  Patient Instructions  Drink plenty of fluids and stay well hydrated Wear a commercial pair of support hose daily and the should be put on the lower extremities the first thing upon arising in the morning Wear these regularly for a couple weeks and see if your feet do not feel better as far as the sensation that you're having on the bottom of the feet. This may also help any discomfort you're having in her joints. Watch sodium intake We will call with lab work results and x-ray results as soon as they become available   Arrie Senate MD   .

## 2016-05-25 ENCOUNTER — Other Ambulatory Visit: Payer: Self-pay | Admitting: *Deleted

## 2016-05-25 ENCOUNTER — Other Ambulatory Visit: Payer: Medicare Other

## 2016-05-25 DIAGNOSIS — R899 Unspecified abnormal finding in specimens from other organs, systems and tissues: Secondary | ICD-10-CM

## 2016-05-25 LAB — POTASSIUM: POTASSIUM: 4.9 mmol/L (ref 3.5–5.2)

## 2016-05-25 LAB — URINE CULTURE

## 2016-05-30 DIAGNOSIS — L821 Other seborrheic keratosis: Secondary | ICD-10-CM | POA: Diagnosis not present

## 2016-05-30 DIAGNOSIS — D225 Melanocytic nevi of trunk: Secondary | ICD-10-CM | POA: Diagnosis not present

## 2016-05-30 DIAGNOSIS — D1801 Hemangioma of skin and subcutaneous tissue: Secondary | ICD-10-CM | POA: Diagnosis not present

## 2016-05-30 DIAGNOSIS — L812 Freckles: Secondary | ICD-10-CM | POA: Diagnosis not present

## 2016-05-31 ENCOUNTER — Other Ambulatory Visit: Payer: Self-pay | Admitting: *Deleted

## 2016-05-31 LAB — BMP8+EGFR
BUN / CREAT RATIO: 26 (ref 12–28)
BUN: 16 mg/dL (ref 8–27)
CALCIUM: 9.7 mg/dL (ref 8.7–10.3)
CHLORIDE: 102 mmol/L (ref 96–106)
CO2: 22 mmol/L (ref 18–29)
Creatinine, Ser: 0.62 mg/dL (ref 0.57–1.00)
GFR calc non Af Amer: 87 mL/min/{1.73_m2} (ref >59)
GFR, EST AFRICAN AMERICAN: 100 mL/min/{1.73_m2} (ref >59)
Glucose: 106 mg/dL — ABNORMAL HIGH (ref 65–99)
POTASSIUM: 6.3 mmol/L — AB (ref 3.5–5.2)
SODIUM: 142 mmol/L (ref 134–144)

## 2016-05-31 LAB — CBC WITH DIFFERENTIAL/PLATELET
BASOS ABS: 0 10*3/uL (ref 0.0–0.2)
BASOS: 1 %
EOS (ABSOLUTE): 0.1 10*3/uL (ref 0.0–0.4)
Eos: 1 %
Hematocrit: 40.4 % (ref 34.0–46.6)
Hemoglobin: 13.3 g/dL (ref 11.1–15.9)
Immature Grans (Abs): 0 10*3/uL (ref 0.0–0.1)
Immature Granulocytes: 0 %
LYMPHS ABS: 2.1 10*3/uL (ref 0.7–3.1)
LYMPHS: 38 %
MCH: 32.7 pg (ref 26.6–33.0)
MCHC: 32.9 g/dL (ref 31.5–35.7)
MCV: 99 fL — ABNORMAL HIGH (ref 79–97)
MONOCYTES: 9 %
MONOS ABS: 0.5 10*3/uL (ref 0.1–0.9)
NEUTROS PCT: 51 %
Neutrophils Absolute: 2.8 10*3/uL (ref 1.4–7.0)
Platelets: 199 10*3/uL (ref 150–379)
RBC: 4.07 x10E6/uL (ref 3.77–5.28)
RDW: 12.6 % (ref 12.3–15.4)
WBC: 5.6 10*3/uL (ref 3.4–10.8)

## 2016-05-31 LAB — ALLERGENS(14) FOODS
Allergen Black Pepper IgE: <0.1 kU/L
Allergen Cinnamon IgE: <0.1 kU/L
Allergen Garlic IgE: <0.1 kU/L
Allergen Grape IgE: <0.1 kU/L
Allergen Pear IgE: <0.1 kU/L
Allergen, Peach f95: <0.1 kU/L
F242-IgE Bing Cherry: <0.1 kU/L
Vanilla: <0.1 kU/L

## 2016-05-31 LAB — URIC ACID: Uric Acid: 3.9 mg/dL (ref 2.5–7.1)

## 2016-05-31 LAB — THYROID PANEL WITH TSH
FREE THYROXINE INDEX: 2 (ref 1.2–4.9)
T3 Uptake Ratio: 28 % (ref 24–39)
T4 TOTAL: 7 ug/dL (ref 4.5–12.0)
TSH: 1.99 u[IU]/mL (ref 0.450–4.500)

## 2016-05-31 LAB — SPECIMEN STATUS REPORT

## 2016-05-31 MED ORDER — ROSUVASTATIN CALCIUM 5 MG PO TABS: 2.5000 mg | ORAL_TABLET | ORAL | Status: DC

## 2016-06-01 LAB — VITAMIN B12: VITAMIN B 12: 484 pg/mL (ref 211–946)

## 2016-06-01 LAB — SPECIMEN STATUS REPORT

## 2016-07-03 ENCOUNTER — Telehealth: Payer: Self-pay | Admitting: Family Medicine

## 2016-07-03 NOTE — Telephone Encounter (Signed)
Spoke with patient and she wants to speak to you about some paper work that was mailed out about her physical. Patient upset and insisted on talking with you. Advised her that you were with patients. Patient understands. Would like you to call her at [phone] between 12:30-2:30.

## 2016-07-04 NOTE — Telephone Encounter (Signed)
Labs discussed  

## 2016-07-17 DIAGNOSIS — H2512 Age-related nuclear cataract, left eye: Secondary | ICD-10-CM | POA: Diagnosis not present

## 2016-07-17 DIAGNOSIS — H43391 Other vitreous opacities, right eye: Secondary | ICD-10-CM | POA: Diagnosis not present

## 2016-07-17 DIAGNOSIS — H44512 Absolute glaucoma, left eye: Secondary | ICD-10-CM | POA: Diagnosis not present

## 2016-07-17 DIAGNOSIS — H43811 Vitreous degeneration, right eye: Secondary | ICD-10-CM | POA: Diagnosis not present

## 2016-08-04 ENCOUNTER — Encounter: Payer: Self-pay | Admitting: Cardiology

## 2016-08-06 NOTE — Progress Notes (Deleted)
HPI The patient presents for followup of palpitations.  Since I last saw her ***  partner died. This was in September 17, 2023. Most recently she lost her cat 14 years. She has been having some increasing palpitations. She's also had anhedonia. She still getting up in doing some things but not social activities.  She feels her palpitations more frequently but still she does not take her Inderal. She feels anxious and she says that sometimes she takes her Xanax. She is describing some mild orthostatic symptoms. Otherwise she's not having any presyncope or syncope. She still walking 2 miles a day. She has no new chest pressure, neck or arm discomfort. She has no new shortness of breath, PND or orthopnea.     Six month follow up  Allergies  Allergen Reactions  . Cephalexin Other (See Comments)    Pt does not remember   . Penicillins Other (See Comments)    Pt's mother was severly allergic, so she has never taken PCN.  . Statins     myalgias    Current Outpatient Prescriptions  Medication Sig Dispense Refill  . ALPRAZolam (XANAX) 0.25 MG tablet TAKE (1/2) TABLET THREE TIMES DAILY. 45 tablet 1  . aspirin 81 MG tablet Take 81 mg by mouth daily.    . cholecalciferol (VITAMIN D) 1000 UNITS tablet Take 1,000 Units by mouth daily.    . Probiotic Product (ALIGN PO) Take 1 capsule by mouth daily.    . propranolol (INDERAL) 20 MG tablet Take 20 mg by mouth as needed (for palpitations). Reported on 03/22/2016    . rosuvastatin (CRESTOR) 5 MG tablet Take 0.5 tablets (2.5 mg total) by mouth 3 (three) times a week. 12 tablet 2   No current facility-administered medications for this visit.     Past Medical History:  Diagnosis Date  . Anxiety   . Hyperlipidemia    Mild  . Osteoporosis   . Palpitations     Past Surgical History:  Procedure Laterality Date  . CYSTECTOMY     Breast  . EYE SURGERY    . OOPHORECTOMY      ROS: ***  As stated in the HPI and negative for all other systems.   08/06/2016   PHYSICAL EXAM There were no vitals taken for this visit. GENERAL:  Well appearing, thin NECK:  No jugular venous distention, waveform within normal limits, carotid upstroke brisk and symmetric, no bruits, no thyromegaly LUNGS:  Clear to auscultation bilaterally HEART:  PMI not displaced or sustained,S1 and S2 within normal limits, no S3, no S4, no clicks, no rubs, no murmurs ABD:  Flat, positive bowel sounds normal in frequency in pitch, no bruits, no rebound, no guarding, no midline pulsatile mass, no hepatomegaly, no splenomegaly EXT:  2 plus pulses throughout, no edema, no cyanosis no clubbing PSYCH:  Tearful  EKG:  Sinus rhythm, rate ***, axis within normal limits, intervals within normal limits, no acute ST-T wave changes.  08/06/2016  ASSESSMENT AND PLAN  PALPITATIONS:  We discussed when necessary treatment with Inderal and Xanax.  I think many of her complaints are probably related to depression. She is open to therapy for this. She has an appointment later this week to see Dr. Laurance Flatten.  DYSLIPIDEMIA:  This is followed by Dr. Laurance Flatten.  She is due to have these checked again.   Her LDL was recently 131.  However, she did not want to take a statin. I think with her medicine sensitivities and her 0 calcium score previously  that I would avoid a statin.

## 2016-08-07 ENCOUNTER — Ambulatory Visit: Payer: Medicare Other | Admitting: Cardiology

## 2016-08-16 ENCOUNTER — Telehealth: Payer: Self-pay | Admitting: Family Medicine

## 2016-08-16 NOTE — Telephone Encounter (Signed)
Pt is getting up at night about 4 times to urinate.  No symptoms of kidney infection / UTI.  This was mentioned at the last visit - she wants something low dose for overactive bladder.  [employer].

## 2016-08-16 NOTE — Telephone Encounter (Signed)
myrbetric samples given to try - she will call in if this works well.

## 2016-08-16 NOTE — Telephone Encounter (Signed)
Try Myrbetriq 25 mg 1 daily at bedtime

## 2016-08-23 DIAGNOSIS — H6122 Impacted cerumen, left ear: Secondary | ICD-10-CM | POA: Diagnosis not present

## 2016-09-02 ENCOUNTER — Ambulatory Visit: Payer: Medicare Other

## 2016-09-04 DIAGNOSIS — H8110 Benign paroxysmal vertigo, unspecified ear: Secondary | ICD-10-CM | POA: Diagnosis not present

## 2016-09-07 ENCOUNTER — Encounter: Payer: Self-pay | Admitting: Family Medicine

## 2016-09-07 ENCOUNTER — Ambulatory Visit (INDEPENDENT_AMBULATORY_CARE_PROVIDER_SITE_OTHER): Payer: Medicare Other | Admitting: Family Medicine

## 2016-09-07 VITALS — BP 111/74 | HR 61 | Temp 97.2°F | Ht 65.0 in | Wt 125.0 lb

## 2016-09-07 DIAGNOSIS — R519 Headache, unspecified: Secondary | ICD-10-CM

## 2016-09-07 DIAGNOSIS — H8309 Labyrinthitis, unspecified ear: Secondary | ICD-10-CM

## 2016-09-07 DIAGNOSIS — R42 Dizziness and giddiness: Secondary | ICD-10-CM | POA: Diagnosis not present

## 2016-09-07 DIAGNOSIS — Z87828 Personal history of other (healed) physical injury and trauma: Secondary | ICD-10-CM

## 2016-09-07 DIAGNOSIS — R51 Headache: Secondary | ICD-10-CM | POA: Diagnosis not present

## 2016-09-07 NOTE — Progress Notes (Signed)
Subjective:    Patient ID: Rose Martinez, female    DOB: 1935-04-23, 81 y.o.   MRN: [id number]  HPI Patient here today for dizziness, head pressure, decreased BP, and fatigue in the mornings. She has seen ENT recently. We'll try to get a copy of the note from the ear nose and throat specialist.     Patient Active Problem List   Diagnosis Date Noted  . Abnormal laboratory test result 05/25/2016  . Thrombocytopenia ([place]) 07/16/2015  . Adjustment disorder with mixed anxiety and depressed mood 06/03/2015  . Vitamin D deficiency 06/03/2015  . Hyperlipidemia 06/03/2015  . Osteoporosis 01/20/2015  . Recurrent urinary tract infection 09/03/2013  . Chest pain 08/17/2011  . Preventive measure 08/16/2011  . HYPOTENSION, UNSPECIFIED 07/13/2010  . MITRAL REGURGITATION 02/02/2010  . PALPITATIONS 02/02/2010   Outpatient Encounter Prescriptions as of 09/07/2016  Medication Sig  . ALPRAZolam (XANAX) 0.25 MG tablet TAKE (1/2) TABLET THREE TIMES DAILY.  Marland Kitchen aspirin 81 MG tablet Take 81 mg by mouth daily.  . cholecalciferol (VITAMIN D) 1000 UNITS tablet Take 1,000 Units by mouth daily.  . Probiotic Product (ALIGN PO) Take 1 capsule by mouth daily.  . propranolol (INDERAL) 20 MG tablet Take 20 mg by mouth as needed (for palpitations). Reported on 03/22/2016  . [DISCONTINUED] rosuvastatin (CRESTOR) 5 MG tablet Take 0.5 tablets (2.5 mg total) by mouth 3 (three) times a week.   No facility-administered encounter medications on file as of 09/07/2016.      Review of Systems  Constitutional: Positive for fatigue.  HENT: Positive for sinus pressure.   Eyes: Negative.   Respiratory: Negative.   Cardiovascular: Negative.        Low BP  Gastrointestinal: Negative.   Endocrine: Negative.   Genitourinary: Negative.   Musculoskeletal: Negative.   Skin: Negative.   Allergic/Immunologic: Negative.   Neurological: Positive for dizziness and weakness (legs are weak).  Hematological: Negative.     Psychiatric/Behavioral: Negative.        Objective:   Physical Exam  Constitutional: She is oriented to person, place, and time. She appears well-developed and well-nourished. She appears distressed.  The patient is pleasant and alert but worried about how she is feeling in her head.  HENT:  Head: Normocephalic and atraumatic.  Right Ear: External ear normal.  Left Ear: External ear normal.  Nose: Nose normal.  Mouth/Throat: Oropharynx is clear and moist.  Eyes: Conjunctivae and EOM are normal. Pupils are equal, round, and reactive to light. Right eye exhibits no discharge. Left eye exhibits no discharge. No scleral icterus.  Neck: Normal range of motion. Neck supple. No thyromegaly present.  No bruits thyromegaly or anterior cervical adenopathy  Cardiovascular: Normal rate, regular rhythm, normal heart sounds and intact distal pulses.   No murmur heard. Heart has a regular rate and rhythm at 60/m  Pulmonary/Chest: Effort normal and breath sounds normal. No respiratory distress. She has no wheezes. She has no rales.  Clear anteriorly and posteriorly  Abdominal: Soft. Bowel sounds are normal. She exhibits no mass. There is no tenderness. There is no rebound and no guarding.  No abdominal tenderness or suprapubic tenderness masses or organ enlargement  Musculoskeletal: Normal range of motion. She exhibits no edema.  Lymphadenopathy:    She has no cervical adenopathy.  Neurological: She is alert and oriented to person, place, and time.  Skin: Skin is warm and dry. No rash noted.  Psychiatric: She has a normal mood and affect. Her behavior is normal.  Judgment and thought content normal.  Nursing note and vitals reviewed.  BP 111/74 (BP Location: Left Arm)   Pulse 61   Temp 97.2 F (36.2 C) (Oral)   Ht 5' 5"  (1.651 m)   Wt 125 lb (56.7 kg)   BMI 20.80 kg/m         Assessment & Plan:  1. Dizziness -Drink plenty of fluids and stay well hydrated -Planned 24-hour blood  pressure monitor and 24-hour Holter monitor -Keep appointment with cardiology - BMP8+EGFR; Future - CBC with Differential/Platelet; Future - Thyroid Panel With TSH; Future  2. Pressure in head -Refer to neurologist if no abnormal findings are found with blood work blood pressure monitor or Holter monitor - BMP8+EGFR; Future - CBC with Differential/Platelet; Future - Thyroid Panel With TSH; Future  3. History of head injury -This happened as a teenager.  4. Labyrinthitis, unspecified laterality -This seems to be a component of vertigo present although mild.  Patient Instructions  Continue to drink plenty of fluids Avoid caffeine as much as possible Use a little extra salt in the diet Return to clinic in the morning fasting for blood work Return to clinic tomorrow for a 24-hour blood pressure monitor Keep appointment with the cardiologist as planned  Arrie Senate MD

## 2016-09-07 NOTE — Patient Instructions (Signed)
Continue to drink plenty of fluids Avoid caffeine as much as possible Use a little extra salt in the diet Return to clinic in the morning fasting for blood work Return to clinic tomorrow for a 24-hour blood pressure monitor Keep appointment with the cardiologist as planned

## 2016-09-08 ENCOUNTER — Other Ambulatory Visit: Payer: Medicare Other

## 2016-09-08 ENCOUNTER — Encounter: Payer: Self-pay | Admitting: Family Medicine

## 2016-09-08 ENCOUNTER — Ambulatory Visit: Payer: Medicare Other | Admitting: Cardiology

## 2016-09-08 DIAGNOSIS — R42 Dizziness and giddiness: Secondary | ICD-10-CM

## 2016-09-08 DIAGNOSIS — R51 Headache: Secondary | ICD-10-CM

## 2016-09-08 DIAGNOSIS — R6889 Other general symptoms and signs: Secondary | ICD-10-CM | POA: Diagnosis not present

## 2016-09-08 DIAGNOSIS — R519 Headache, unspecified: Secondary | ICD-10-CM

## 2016-09-09 LAB — CBC WITH DIFFERENTIAL/PLATELET
BASOS ABS: 0 10*3/uL (ref 0.0–0.2)
BASOS: 1 %
EOS (ABSOLUTE): 0.1 10*3/uL (ref 0.0–0.4)
Eos: 3 %
Hematocrit: 40.3 % (ref 34.0–46.6)
Hemoglobin: 13.3 g/dL (ref 11.1–15.9)
Immature Grans (Abs): 0 10*3/uL (ref 0.0–0.1)
Immature Granulocytes: 0 %
LYMPHS ABS: 1.4 10*3/uL (ref 0.7–3.1)
Lymphs: 34 %
MCH: 32.8 pg (ref 26.6–33.0)
MCHC: 33 g/dL (ref 31.5–35.7)
MCV: 99 fL — AB (ref 79–97)
MONOS ABS: 0.3 10*3/uL (ref 0.1–0.9)
Monocytes: 8 %
NEUTROS ABS: 2.2 10*3/uL (ref 1.4–7.0)
Neutrophils: 54 %
PLATELETS: 179 10*3/uL (ref 150–379)
RBC: 4.06 x10E6/uL (ref 3.77–5.28)
RDW: 12.8 % (ref 12.3–15.4)
WBC: 4 10*3/uL (ref 3.4–10.8)

## 2016-09-09 LAB — BMP8+EGFR
BUN / CREAT RATIO: 19 (ref 12–28)
BUN: 13 mg/dL (ref 8–27)
CALCIUM: 9.2 mg/dL (ref 8.7–10.3)
CHLORIDE: 106 mmol/L (ref 96–106)
CO2: 27 mmol/L (ref 18–29)
Creatinine, Ser: 0.67 mg/dL (ref 0.57–1.00)
GFR calc non Af Amer: 84 mL/min/{1.73_m2} (ref >59)
GFR, EST AFRICAN AMERICAN: 97 mL/min/{1.73_m2} (ref >59)
Glucose: 86 mg/dL (ref 65–99)
POTASSIUM: 5 mmol/L (ref 3.5–5.2)
SODIUM: 145 mmol/L — AB (ref 134–144)

## 2016-09-09 LAB — THYROID PANEL WITH TSH
Free Thyroxine Index: 2.2 (ref 1.2–4.9)
T3 Uptake Ratio: 29 % (ref 24–39)
T4, Total: 7.5 ug/dL (ref 4.5–12.0)
TSH: 2.14 u[IU]/mL (ref 0.450–4.500)

## 2016-09-11 ENCOUNTER — Telehealth: Payer: Self-pay | Admitting: Cardiology

## 2016-09-11 NOTE — Telephone Encounter (Signed)
Records received from [facility] nose [facility] throat for apt on 10/11/16 w/ Dr Percival Spanish, records given to [place] (medical records) CN

## 2016-10-04 ENCOUNTER — Ambulatory Visit: Payer: Medicare Other | Admitting: Family Medicine

## 2016-10-04 ENCOUNTER — Ambulatory Visit: Payer: Medicare Other | Admitting: Cardiology

## 2016-10-10 NOTE — Progress Notes (Signed)
    HPI The patient presents for followup of palpitations.  Prior to the last visit her partner died and she was dealing with the depression of this.  She has lots of somatic complaints that she describes today having to pages of this list. She does have some palpitations but these are actually the least bothersome thing she describes. She's not taking propranolol. She was told at one point her blood pressure was low but Dr. Laurance Flatten followed up on this and it seems to be fine. She does think that her complaints are much improved over time.  She is exercising.  The patient denies any new symptoms such as chest discomfort, neck or arm discomfort. There has been no new shortness of breath, PND or orthopnea. There has been no presyncope or syncope.  Allergies  Allergen Reactions  . Cephalexin Other (See Comments)    Pt does not remember   . Penicillins Other (See Comments)    Pt's mother was severly allergic, so she has never taken PCN.  . Statins     myalgias    Current Outpatient Prescriptions  Medication Sig Dispense Refill  . ALPRAZolam (XANAX) 0.25 MG tablet TAKE (1/2) TABLET THREE TIMES DAILY. 45 tablet 1  . aspirin 81 MG tablet Take 81 mg by mouth daily.    . cholecalciferol (VITAMIN D) 1000 UNITS tablet Take 2,000 Units by mouth daily.     . fluticasone (FLONASE) 50 MCG/ACT nasal spray Place 1 spray into both nostrils daily.    . Probiotic Product (ALIGN PO) Take 1 capsule by mouth daily.    . propranolol (INDERAL) 20 MG tablet Take 20 mg by mouth as needed (for palpitations). Reported on 03/22/2016     No current facility-administered medications for this visit.     Past Medical History:  Diagnosis Date  . Anxiety   . Hyperlipidemia    Mild  . Osteoporosis   . Palpitations     Past Surgical History:  Procedure Laterality Date  . CYSTECTOMY     Breast  . EYE SURGERY    . OOPHORECTOMY      ROS:    As stated in the HPI and negative for all other systems.     PHYSICAL  EXAM BP 111/66   Pulse 64   Ht 5\' 5"  (1.651 m)   Wt 125 lb (56.7 kg)   BMI 20.80 kg/m  GENERAL:  Well appearing, thin NECK:  No jugular venous distention, waveform within normal limits, carotid upstroke brisk and symmetric, no bruits, no thyromegaly LUNGS:  Clear to auscultation bilaterally HEART:  PMI not displaced or sustained,S1 and S2 within normal limits, no S3, no S4, no clicks, no rubs, no murmurs ABD:  Flat, positive bowel sounds normal in frequency in pitch, no bruits, no rebound, no guarding, no midline pulsatile mass, no hepatomegaly, no splenomegaly EXT:  2 plus pulses throughout, no edema, no cyanosis no clubbing  EKG:  Sinus rhythm, sinus arrhythmia, axis within normal limits, intervals within normal limits, no acute ST-T wave changes.   ASSESSMENT AND PLAN  PALPITATIONS:  These are improved. No change in therapy is indicated.Marland Kitchen

## 2016-10-11 ENCOUNTER — Encounter: Payer: Self-pay | Admitting: Cardiology

## 2016-10-11 ENCOUNTER — Ambulatory Visit (INDEPENDENT_AMBULATORY_CARE_PROVIDER_SITE_OTHER): Payer: Medicare Other | Admitting: Cardiology

## 2016-10-11 VITALS — BP 111/66 | HR 64 | Ht 65.0 in | Wt 125.0 lb

## 2016-10-11 DIAGNOSIS — R002 Palpitations: Secondary | ICD-10-CM | POA: Diagnosis not present

## 2016-10-11 NOTE — Patient Instructions (Signed)

## 2016-10-31 ENCOUNTER — Other Ambulatory Visit: Payer: Self-pay | Admitting: Family Medicine

## 2016-10-31 NOTE — Telephone Encounter (Signed)
Refill called to [facility] 

## 2016-10-31 NOTE — Telephone Encounter (Signed)
Last filled 08/09/16, last seen 09/07/16. Call in at [place]

## 2016-11-16 ENCOUNTER — Telehealth: Payer: Self-pay | Admitting: Family Medicine

## 2016-11-20 ENCOUNTER — Encounter: Payer: Self-pay | Admitting: Family Medicine

## 2016-11-20 ENCOUNTER — Ambulatory Visit (INDEPENDENT_AMBULATORY_CARE_PROVIDER_SITE_OTHER): Payer: Medicare Other | Admitting: Family Medicine

## 2016-11-20 VITALS — BP 112/63 | HR 59 | Temp 97.1°F | Ht 65.0 in | Wt 126.0 lb

## 2016-11-20 DIAGNOSIS — K2900 Acute gastritis without bleeding: Secondary | ICD-10-CM | POA: Diagnosis not present

## 2016-11-20 NOTE — Progress Notes (Signed)
Subjective:    Patient ID: Rose Martinez, female    DOB: 09-03-1935, 81 y.o.   MRN: [id number]  HPI Patient here today for occasional epigastric pain and acid. She states that Tums have helped. The patient has held the aspirin for several days. She has increased acid reflux epigastric pain and discomfort in the left rib area. This is been going on for about a week and after she stopped the aspirin seems like the discomfort got better. She does not drink any alcohol she has had a little extra caffeine. Her bowel habits have not changed in their normal in color and there is no blood in the stool. She is passing her water without problems.    Patient Active Problem List   Diagnosis Date Noted  . Abnormal laboratory test result 05/25/2016  . Thrombocytopenia ([place]) 07/16/2015  . Adjustment disorder with mixed anxiety and depressed mood 06/03/2015  . Vitamin D deficiency 06/03/2015  . Hyperlipidemia 06/03/2015  . Osteoporosis 01/20/2015  . Recurrent urinary tract infection 09/03/2013  . Chest pain 08/17/2011  . Preventive measure 08/16/2011  . HYPOTENSION, UNSPECIFIED 07/13/2010  . MITRAL REGURGITATION 02/02/2010  . PALPITATIONS 02/02/2010   Outpatient Encounter Prescriptions as of 11/20/2016  Medication Sig  . ALPRAZolam (XANAX) 0.25 MG tablet TAKE (1/2) TABLET THREE TIMES DAILY.  Marland Kitchen aspirin 81 MG tablet Take 81 mg by mouth daily.  . cholecalciferol (VITAMIN D) 1000 UNITS tablet Take 2,000 Units by mouth daily.   . fluticasone (FLONASE) 50 MCG/ACT nasal spray Place 1 spray into both nostrils daily.  . Probiotic Product (ALIGN PO) Take 1 capsule by mouth daily.  . propranolol (INDERAL) 20 MG tablet Take 20 mg by mouth as needed (for palpitations). Reported on 03/22/2016   No facility-administered encounter medications on file as of 11/20/2016.            Review of Systems  Constitutional: Negative.   HENT: Negative.   Eyes: Negative.   Respiratory: Negative.   Cardiovascular:  Negative.   Gastrointestinal: Positive for abdominal pain (epigastric at times - acid ).  Endocrine: Negative.   Genitourinary: Negative.   Musculoskeletal: Negative.   Skin: Negative.   Allergic/Immunologic: Negative.   Neurological: Negative.   Hematological: Negative.   Psychiatric/Behavioral: Negative.        Objective:   Physical Exam  Constitutional: She is oriented to person, place, and time. She appears well-developed and well-nourished.  HENT:  Head: Normocephalic and atraumatic.  Eyes: Conjunctivae and EOM are normal. Pupils are equal, round, and reactive to light. Right eye exhibits no discharge. Left eye exhibits no discharge. No scleral icterus.  Neck: Normal range of motion. Neck supple. No thyromegaly present.  Cardiovascular: Normal rate, regular rhythm and normal heart sounds.   No murmur heard. Pulmonary/Chest: Effort normal and breath sounds normal. No respiratory distress. She has no wheezes. She has no rales.  Clear anteriorly and posteriorly  Abdominal: Soft. Bowel sounds are normal. She exhibits no mass. There is no tenderness. There is no rebound and no guarding.  Musculoskeletal: Normal range of motion. She exhibits no edema.  Lymphadenopathy:    She has no cervical adenopathy.  Neurological: She is alert and oriented to person, place, and time. She has normal reflexes. No cranial nerve deficit.  Skin: Skin is warm and dry. No rash noted.  Psychiatric: She has a normal mood and affect. Her behavior is normal. Judgment and thought content normal.  Nursing note and vitals reviewed.  BP 112/63 (BP  Location: Left Arm)   Pulse (!) 59   Temp 97.1 F (36.2 C) (Oral)   Ht 5\' 5"  (1.651 m)   Wt 126 lb (57.2 kg)   BMI 20.97 kg/m         Assessment & Plan:  1. Other acute gastritis without hemorrhage -Take ranitidine twice daily before breakfast and supper 150 mg. Do this regularly for 3-4 weeks and then as needed after that -Restart the coated baby  aspirin in a couple weeks after breakfast -If any recurrent problems get back in touch with [place]  Patient Instructions  Take Zantac or ranitidine twice daily before breakfast and supper. Take this regularly for about a month then as needed Restart the coated baby aspirin in a couple of weeks one daily after breakfast Avoid irritating foods as much as possible If your stomach gets worse in the meantime please get back in touch with [place]  Arrie Senate MD

## 2016-11-20 NOTE — Patient Instructions (Signed)
Take Zantac or ranitidine twice daily before breakfast and supper. Take this regularly for about a month then as needed Restart the coated baby aspirin in a couple of weeks one daily after breakfast Avoid irritating foods as much as possible If your stomach gets worse in the meantime please get back in touch with [place]

## 2016-12-13 ENCOUNTER — Other Ambulatory Visit: Payer: Self-pay | Admitting: Family Medicine

## 2016-12-13 ENCOUNTER — Telehealth: Payer: Self-pay | Admitting: Family Medicine

## 2016-12-13 NOTE — Telephone Encounter (Signed)
appt made for tomorrow

## 2016-12-14 ENCOUNTER — Encounter: Payer: Self-pay | Admitting: Family Medicine

## 2016-12-14 ENCOUNTER — Ambulatory Visit: Payer: Medicare Other | Admitting: Family Medicine

## 2016-12-14 ENCOUNTER — Ambulatory Visit (INDEPENDENT_AMBULATORY_CARE_PROVIDER_SITE_OTHER): Payer: Medicare Other | Admitting: Family Medicine

## 2016-12-14 VITALS — BP 100/58 | HR 64 | Temp 97.5°F | Ht 65.0 in | Wt 126.0 lb

## 2016-12-14 DIAGNOSIS — J301 Allergic rhinitis due to pollen: Secondary | ICD-10-CM | POA: Diagnosis not present

## 2016-12-14 DIAGNOSIS — F419 Anxiety disorder, unspecified: Secondary | ICD-10-CM | POA: Diagnosis not present

## 2016-12-14 DIAGNOSIS — R002 Palpitations: Secondary | ICD-10-CM | POA: Diagnosis not present

## 2016-12-14 NOTE — Patient Instructions (Signed)
Continue to keep caffeine out of your diet as much as possible Take an extra half of an Inderal twice daily for 1 week and then as needed Take a half of a Xanax if needed Drink plenty of fluids and stay well hydrated Restart fluticasone and stay with one spray each nostril daily at bedtime Use nasal saline frequently during the day

## 2016-12-14 NOTE — Progress Notes (Signed)
Subjective:    Patient ID: Rose Martinez, female    DOB: 04-13-1935, 81 y.o.   MRN: [id number]  HPI Patient here today for palpitations. This has been more noticeable the last few days. She has held all of her medications for the last few days. The patient is doing well overall. She is complains of more palpitations especially over this weekend. The patient is still mourning the loss of her partner from over a year ago and this would be expected. She had her Christmas party this year that she did not have shear and said this was good for her in that she was able to laugh and have fun again with her friends. She was still somewhat tearful in the office talking about the loss of her partner. She did admit to having increased amounts of caffeine recently. She is also dealing with the loss of the lady that has taken care of her for years and that she lost her husband recently. She has had additional stress on her over the past several weeks. She denies any other issues with chest pain or shortness of breath or GI symptoms or urinary tract symptoms. She does complain of the nasal congestion and I reassured her that Flonase should have no issues with affecting her palpitations that I was aware of.    Patient Active Problem List   Diagnosis Date Noted  . Abnormal laboratory test result 05/25/2016  . Thrombocytopenia ([place]) 07/16/2015  . Adjustment disorder with mixed anxiety and depressed mood 06/03/2015  . Vitamin D deficiency 06/03/2015  . Hyperlipidemia 06/03/2015  . Osteoporosis 01/20/2015  . Recurrent urinary tract infection 09/03/2013  . Chest pain 08/17/2011  . Preventive measure 08/16/2011  . HYPOTENSION, UNSPECIFIED 07/13/2010  . MITRAL REGURGITATION 02/02/2010  . PALPITATIONS 02/02/2010   Outpatient Encounter Prescriptions as of 12/14/2016  Medication Sig  . ALPRAZolam (XANAX) 0.25 MG tablet TAKE (1/2) TABLET THREE TIMES DAILY.  Marland Kitchen aspirin 81 MG tablet Take 81 mg by mouth daily.    . cholecalciferol (VITAMIN D) 1000 UNITS tablet Take 2,000 Units by mouth daily.   . fluticasone (FLONASE) 50 MCG/ACT nasal spray Place 1 spray into both nostrils daily.  . Probiotic Product (ALIGN PO) Take 1 capsule by mouth daily.  . propranolol (INDERAL) 20 MG tablet TAKE (1) TABLET DAILY AS DIRECTED.  . [DISCONTINUED] propranolol (INDERAL) 20 MG tablet Take 20 mg by mouth as needed (for palpitations). Reported on 03/22/2016   No facility-administered encounter medications on file as of 12/14/2016.       Review of Systems  Constitutional: Negative.   HENT: Negative.   Eyes: Negative.   Respiratory: Negative.  Negative for shortness of breath.   Cardiovascular: Positive for palpitations (at times).  Gastrointestinal: Negative.   Endocrine: Negative.   Genitourinary: Negative.   Musculoskeletal: Negative.   Skin: Negative.   Allergic/Immunologic: Negative.   Neurological: Positive for light-headedness (at times). Negative for dizziness.  Hematological: Negative.   Psychiatric/Behavioral: Negative.        Objective:   Physical Exam  Constitutional: She is oriented to person, place, and time. She appears well-developed and well-nourished. No distress.  HENT:  Head: Normocephalic and atraumatic.  Mouth/Throat: Oropharynx is clear and moist. No oropharyngeal exudate.  Nasal turbinate congestion bilaterally  Eyes: Conjunctivae and EOM are normal. Pupils are equal, round, and reactive to light. Right eye exhibits no discharge. Left eye exhibits no discharge. No scleral icterus.  Neck: Normal range of motion. Neck supple. No thyromegaly  present.  No bruits thyromegaly or anterior cervical adenopathy  Cardiovascular: Normal rate, regular rhythm and normal heart sounds.   No murmur heard. The heart was regular at 60-72/m. There was no sinus arrhythmia on the EKG or arrhythmia of any kind detected.  Pulmonary/Chest: Effort normal and breath sounds normal. No respiratory distress. She  has no wheezes. She has no rales.  Clear anteriorly and posteriorly  Abdominal: Soft. Bowel sounds are normal. She exhibits no mass. There is no tenderness. There is no rebound and no guarding.  Musculoskeletal: Normal range of motion. She exhibits no edema.  Lymphadenopathy:    She has no cervical adenopathy.  Neurological: She is alert and oriented to person, place, and time. No cranial nerve deficit.  Skin: Skin is warm and dry. No rash noted.  Psychiatric: She has a normal mood and affect. Her behavior is normal. Judgment and thought content normal.  Nursing note and vitals reviewed.  BP (!) 100/58 (BP Location: Left Arm)   Pulse 64   Temp 97.5 F (36.4 C) (Oral)   Ht 5\' 5"  (1.651 m)   Wt 126 lb (57.2 kg)   BMI 20.97 kg/m         Assessment & Plan:  1. Palpitations -On physical exam and EKG today the heart had a regular rate and rhythm. -The patient was once again encouraged to diminish her caffeine intake which she had had more of recently. -There was a continue discussion regarding the loss of her partner one year ago. - EKG 12-Lead  2. Anxiety disorder, unspecified type -Take a half of a Xanax only if needed for these palpitation occasions.  3. Acute nonseasonal allergic rhinitis due to pollen -Continue with Flonase and nasal saline  Patient Instructions  Continue to keep caffeine out of your diet as much as possible Take an extra half of an Inderal twice daily for 1 week and then as needed Take a half of a Xanax if needed Drink plenty of fluids and stay well hydrated Restart fluticasone and stay with one spray each nostril daily at bedtime Use nasal saline frequently during the day   Arrie Senate MD

## 2016-12-20 ENCOUNTER — Telehealth: Payer: Self-pay | Admitting: Cardiology

## 2016-12-20 NOTE — Telephone Encounter (Signed)
New Message  Pt voiced she is wanting to speak with MD-Hochrein.  Pt voiced she will be in [place] and may stop by but she states for MD-Hochrein to return her call.  Please f/u with pt

## 2016-12-20 NOTE — Telephone Encounter (Signed)
Attempted to contact patient - phone rang, no answer 

## 2016-12-21 DIAGNOSIS — L812 Freckles: Secondary | ICD-10-CM | POA: Diagnosis not present

## 2016-12-21 DIAGNOSIS — L821 Other seborrheic keratosis: Secondary | ICD-10-CM | POA: Diagnosis not present

## 2016-12-21 DIAGNOSIS — D225 Melanocytic nevi of trunk: Secondary | ICD-10-CM | POA: Diagnosis not present

## 2016-12-21 DIAGNOSIS — D1801 Hemangioma of skin and subcutaneous tissue: Secondary | ICD-10-CM | POA: Diagnosis not present

## 2016-12-21 DIAGNOSIS — L218 Other seborrheic dermatitis: Secondary | ICD-10-CM | POA: Diagnosis not present

## 2016-12-21 DIAGNOSIS — L738 Other specified follicular disorders: Secondary | ICD-10-CM | POA: Diagnosis not present

## 2016-12-21 DIAGNOSIS — L853 Xerosis cutis: Secondary | ICD-10-CM | POA: Diagnosis not present

## 2016-12-21 NOTE — Telephone Encounter (Signed)
Received a phone called from Mrs Barry Dienes, pt stated Dr Laurance Flatten prescribe Fluticasone 50 mcg, she stated since taking fluticasone her heart have been skipping beats and fluttering. Pt wants to know if Fluticasone can cause your heart to skipped beat and cause fluttering

## 2016-12-25 DIAGNOSIS — Z961 Presence of intraocular lens: Secondary | ICD-10-CM | POA: Diagnosis not present

## 2016-12-25 DIAGNOSIS — H43811 Vitreous degeneration, right eye: Secondary | ICD-10-CM | POA: Diagnosis not present

## 2016-12-25 DIAGNOSIS — H04121 Dry eye syndrome of right lacrimal gland: Secondary | ICD-10-CM | POA: Diagnosis not present

## 2017-02-22 ENCOUNTER — Other Ambulatory Visit: Payer: Self-pay | Admitting: Family Medicine

## 2017-02-22 NOTE — Telephone Encounter (Signed)
Last filled 10/31/16, last seen 12/14/16. Call in at [place]

## 2017-02-26 DIAGNOSIS — L570 Actinic keratosis: Secondary | ICD-10-CM | POA: Diagnosis not present

## 2017-02-26 DIAGNOSIS — L218 Other seborrheic dermatitis: Secondary | ICD-10-CM | POA: Diagnosis not present

## 2017-03-15 DIAGNOSIS — L601 Onycholysis: Secondary | ICD-10-CM | POA: Diagnosis not present

## 2017-03-15 DIAGNOSIS — L218 Other seborrheic dermatitis: Secondary | ICD-10-CM | POA: Diagnosis not present

## 2017-03-22 ENCOUNTER — Ambulatory Visit (INDEPENDENT_AMBULATORY_CARE_PROVIDER_SITE_OTHER): Payer: Medicare Other | Admitting: Family Medicine

## 2017-03-22 ENCOUNTER — Encounter: Payer: Self-pay | Admitting: Family Medicine

## 2017-03-22 VITALS — BP 116/62 | HR 60 | Temp 97.1°F | Ht 65.0 in | Wt 123.0 lb

## 2017-03-22 DIAGNOSIS — M79674 Pain in right toe(s): Secondary | ICD-10-CM

## 2017-03-22 DIAGNOSIS — E7800 Pure hypercholesterolemia, unspecified: Secondary | ICD-10-CM

## 2017-03-22 DIAGNOSIS — E559 Vitamin D deficiency, unspecified: Secondary | ICD-10-CM | POA: Diagnosis not present

## 2017-03-22 DIAGNOSIS — D696 Thrombocytopenia, unspecified: Secondary | ICD-10-CM | POA: Diagnosis not present

## 2017-03-22 NOTE — Patient Instructions (Signed)
Medicare Annual Wellness Visit  Jessamine and the medical providers at [facility] strive to bring you the best medical care.  In doing so we not only want to address your current medical conditions and concerns but also to detect new conditions early and prevent illness, disease and health-related problems.    Medicare offers a yearly Wellness Visit which allows our clinical staff to assess your need for preventative services including immunizations, lifestyle education, counseling to decrease risk of preventable diseases and screening for fall risk and other medical concerns.    This visit is provided free of charge (no copay) for all Medicare recipients. The clinical pharmacists at [facility] have begun to conduct these Wellness Visits which will also include a thorough review of all your medications.    As you primary medical provider recommend that you make an appointment for your Annual Wellness Visit if you have not done so already this year.  You may set up this appointment before you leave today or you may call back ([phone]) and schedule an appointment.  Please make sure when you call that you mention that you are scheduling your Annual Wellness Visit with the clinical pharmacist so that the appointment may be made for the proper length of time.     Continue current medications. Continue good therapeutic lifestyle changes which include good diet and exercise. Fall precautions discussed with patient. If an FOBT was given today- please return it to our front desk. If you are over 50 years old - you may need Prevnar 13 or the adult Pneumonia vaccine.  **Flu shots are available--- please call and schedule a FLU-CLINIC appointment**  After your visit with us today you will receive a survey in the mail or online from [employer] regarding your care with us. Please take a moment to fill this out. Your feedback is very  important to us as you can help us better understand your patient needs as well as improve your experience and satisfaction. WE CARE ABOUT YOU!!!    

## 2017-03-22 NOTE — Progress Notes (Signed)
Subjective:    Patient ID: Rose Martinez, female    DOB: February 25, 1935, 82 y.o.   MRN: [id number]  HPI Pt here for follow up and management of chronic medical problems which includes hyperlipidemia. She is taking medication regularly.The patient is doing well overall. She complains of some anxiety and increased nervousness today. She also complains of some right great toe pain. She will return to the office for fasting blood work and she will get her DEXA scan and chest x-ray at a future visit. She'll be given an FOBT to return. She does have Xanax that she takes at home and not sure she is taking this regularly or not. The patient is doing well overall. She complains of anxiety and this keeps her awake at night time worrying about things that she should not worry about. She also complains of occasional sharp pain in the right great toe that is not constant. The toe pain is not swollen and she has not injured it. She is due to get lab work but will come back to get this when she is fasting and will get a DEXA scan and chest x-ray at the same time. She continues to have occasional palpitations but does not take her Inderal regularly. She continues to avoid caffeine other than some caffeine in the morning and not any after lunch. She denies any chest pain or shortness of breath. She denies any trouble with her stomach including nausea vomiting diarrhea blood in the stool or black tarry bowel movements. She is passing her water without problems.  Patient Active Problem List   Diagnosis Date Noted  . Abnormal laboratory test result 05/25/2016  . Thrombocytopenia ([place]) 07/16/2015  . Adjustment disorder with mixed anxiety and depressed mood 06/03/2015  . Vitamin D deficiency 06/03/2015  . Hyperlipidemia 06/03/2015  . Osteoporosis 01/20/2015  . Recurrent urinary tract infection 09/03/2013  . Chest pain 08/17/2011  . Preventive measure 08/16/2011  . HYPOTENSION, UNSPECIFIED 07/13/2010  . MITRAL  REGURGITATION 02/02/2010  . PALPITATIONS 02/02/2010   Outpatient Encounter Prescriptions as of 03/22/2017  Medication Sig  . ALPRAZolam (XANAX) 0.25 MG tablet TAKE (1/2) TABLET THREE TIMES DAILY.  Marland Kitchen aspirin 81 MG tablet Take 81 mg by mouth daily.  . cholecalciferol (VITAMIN D) 1000 UNITS tablet Take 2,000 Units by mouth daily.   . fluticasone (FLONASE) 50 MCG/ACT nasal spray Place 1 spray into both nostrils daily.  . Probiotic Product (ALIGN PO) Take 1 capsule by mouth daily.  . propranolol (INDERAL) 20 MG tablet TAKE (1) TABLET DAILY AS DIRECTED.   No facility-administered encounter medications on file as of 03/22/2017.       Review of Systems  Constitutional: Negative.   HENT: Negative.   Eyes: Negative.   Respiratory: Negative.   Cardiovascular: Negative.   Gastrointestinal: Negative.   Endocrine: Negative.   Genitourinary: Negative.   Musculoskeletal: Positive for arthralgias (right great toe pain).  Skin: Negative.   Allergic/Immunologic: Negative.   Neurological: Negative.   Hematological: Negative.   Psychiatric/Behavioral: The patient is nervous/anxious (anxiety at times).        Objective:   Physical Exam  Constitutional: She is oriented to person, place, and time. She appears well-developed and well-nourished. No distress.  Patient is pleasant and alert and is worrying a lot. There was no mention today of her former friend who is passed away.  HENT:  Head: Normocephalic and atraumatic.  Right Ear: External ear normal.  Nose: Nose normal.  Mouth/Throat: Oropharynx is clear  and moist.  There is a small amount of cerumen in the left ear canal.  Eyes: Conjunctivae and EOM are normal. Pupils are equal, round, and reactive to light. Right eye exhibits no discharge. Left eye exhibits no discharge. No scleral icterus.  Neck: Normal range of motion. Neck supple. No thyromegaly present.  No bruits or thyromegaly  Cardiovascular: Normal rate, regular rhythm, normal heart  sounds and intact distal pulses.   No murmur heard. The heart is regular at 60/m  Pulmonary/Chest: Effort normal and breath sounds normal. No respiratory distress. She has no wheezes. She has no rales.  Lungs are clear anteriorly and posteriorly  Abdominal: Soft. Bowel sounds are normal. She exhibits no mass. There is no tenderness. There is no rebound and no guarding.  No abdominal tenderness masses or organ enlargement or bruits  Musculoskeletal: Normal range of motion. She exhibits no edema.  There is  no swelling in the right great toe or redness.  Lymphadenopathy:    She has no cervical adenopathy.  Neurological: She is alert and oriented to person, place, and time. She has normal reflexes. No cranial nerve deficit.  Skin: Skin is warm and dry. No rash noted.  Psychiatric: She has a normal mood and affect. Her behavior is normal. Judgment and thought content normal.  Nursing note and vitals reviewed.  BP 116/62 (BP Location: Left Arm)   Pulse 60   Temp 97.1 F (36.2 C) (Oral)   Ht _0  (1.651 m)   Wt 123 lb (55.8 kg)   BMI 20.47 kg/m         Assessment & Plan:  1. Pure hypercholesterolemia -Tinny with aggressive therapeutic lifestyle changes - CBC with Differential/Platelet; Future - BMP8+EGFR; Future - Hepatic function panel; Future - NMR, lipoprofile; Future  2. Vitamin D deficiency -Tinny with current vitamin D replacement pending results of lab work - CBC with Differential/Platelet; Future - VITAMIN D 25 Hydroxy (Vit-D Deficiency, Fractures); Future  3. Thrombocytopenia ([place]) -There is no history of any increased bruising. Or bleeding. - CBC with Differential/Platelet; Future  4. Great toe pain, right -We will continue to watch this is his most likely secondary to some arthritis in the toe.  Patient Instructions                       Medicare Annual Wellness Visit  [facility] and the medical providers at [facility] strive to bring  you the best medical care.  In doing so we not only want to address your current medical conditions and concerns but also to detect new conditions early and prevent illness, disease and health-related problems.    Medicare offers a yearly Wellness Visit which allows our clinical staff to assess your need for preventative services including immunizations, lifestyle education, counseling to decrease risk of preventable diseases and screening for fall risk and other medical concerns.    This visit is provided free of charge (no copay) for all Medicare recipients. The clinical pharmacists at [facility] have begun to conduct these Wellness Visits which will also include a thorough review of all your medications.    As you primary medical provider recommend that you make an appointment for your Annual Wellness Visit if you have not done so already this year.  You may set up this appointment before you leave today or you may call back ([phone]) and schedule an appointment.  Please make sure when you call that you mention that  you are scheduling your Annual Wellness Visit with the clinical pharmacist so that the appointment may be made for the proper length of time.     Continue current medications. Continue good therapeutic lifestyle changes which include good diet and exercise. Fall precautions discussed with patient. If an FOBT was given today- please return it to our front desk. If you are over 32 years old - you may need Prevnar 27 or the adult Pneumonia vaccine.  **Flu shots are available--- please call and schedule a FLU-CLINIC appointment**  After your visit with [place] today you will receive a survey in the mail or online from [employer] regarding your care with [place]. Please take a moment to fill this out. Your feedback is very important to [place] as you can help [place] better understand your patient needs as well as improve your experience and satisfaction. WE CARE ABOUT YOU!!!      Arrie Senate MD  - Uric acid; Future

## 2017-03-28 DIAGNOSIS — H6122 Impacted cerumen, left ear: Secondary | ICD-10-CM | POA: Diagnosis not present

## 2017-03-29 ENCOUNTER — Other Ambulatory Visit: Payer: Self-pay | Admitting: *Deleted

## 2017-03-29 DIAGNOSIS — R29898 Other symptoms and signs involving the musculoskeletal system: Secondary | ICD-10-CM

## 2017-03-30 ENCOUNTER — Other Ambulatory Visit: Payer: Medicare Other

## 2017-03-30 DIAGNOSIS — E7800 Pure hypercholesterolemia, unspecified: Secondary | ICD-10-CM

## 2017-03-30 DIAGNOSIS — R29898 Other symptoms and signs involving the musculoskeletal system: Secondary | ICD-10-CM

## 2017-03-30 DIAGNOSIS — D696 Thrombocytopenia, unspecified: Secondary | ICD-10-CM | POA: Diagnosis not present

## 2017-03-30 DIAGNOSIS — E559 Vitamin D deficiency, unspecified: Secondary | ICD-10-CM | POA: Diagnosis not present

## 2017-03-30 DIAGNOSIS — M79674 Pain in right toe(s): Secondary | ICD-10-CM

## 2017-03-31 LAB — CBC WITH DIFFERENTIAL/PLATELET
BASOS: 1 %
Basophils Absolute: 0 10*3/uL (ref 0.0–0.2)
EOS (ABSOLUTE): 0.1 10*3/uL (ref 0.0–0.4)
Eos: 3 %
HEMOGLOBIN: 13.1 g/dL (ref 11.1–15.9)
Hematocrit: 41.2 % (ref 34.0–46.6)
IMMATURE GRANS (ABS): 0 10*3/uL (ref 0.0–0.1)
Immature Granulocytes: 0 %
LYMPHS: 34 %
Lymphocytes Absolute: 1.4 10*3/uL (ref 0.7–3.1)
MCH: 31.7 pg (ref 26.6–33.0)
MCHC: 31.8 g/dL (ref 31.5–35.7)
MCV: 100 fL — AB (ref 79–97)
MONOCYTES: 7 %
Monocytes Absolute: 0.3 10*3/uL (ref 0.1–0.9)
NEUTROS ABS: 2.4 10*3/uL (ref 1.4–7.0)
Neutrophils: 55 %
Platelets: 178 10*3/uL (ref 150–379)
RBC: 4.13 x10E6/uL (ref 3.77–5.28)
RDW: 12.8 % (ref 12.3–15.4)
WBC: 4.2 10*3/uL (ref 3.4–10.8)

## 2017-03-31 LAB — BMP8+EGFR
BUN / CREAT RATIO: 23 (ref 12–28)
BUN: 14 mg/dL (ref 8–27)
CO2: 28 mmol/L (ref 18–29)
CREATININE: 0.62 mg/dL (ref 0.57–1.00)
Calcium: 9.5 mg/dL (ref 8.7–10.3)
Chloride: 105 mmol/L (ref 96–106)
GFR, EST AFRICAN AMERICAN: 99 mL/min/{1.73_m2} (ref >59)
GFR, EST NON AFRICAN AMERICAN: 86 mL/min/{1.73_m2} (ref >59)
Glucose: 92 mg/dL (ref 65–99)
Potassium: 5.3 mmol/L — ABNORMAL HIGH (ref 3.5–5.2)
Sodium: 148 mmol/L — ABNORMAL HIGH (ref 134–144)

## 2017-03-31 LAB — HEPATIC FUNCTION PANEL
ALK PHOS: 83 IU/L (ref 39–117)
ALT: 17 IU/L (ref 0–32)
AST: 12 IU/L (ref 0–40)
Albumin: 4.2 g/dL (ref 3.5–4.8)
Bilirubin Total: 0.4 mg/dL (ref 0.0–1.2)
Bilirubin, Direct: 0.12 mg/dL (ref 0.00–0.40)
Total Protein: 6.5 g/dL (ref 6.0–8.5)

## 2017-03-31 LAB — NMR, LIPOPROFILE
Cholesterol: 221 mg/dL — ABNORMAL HIGH (ref 100–199)
HDL Cholesterol by NMR: 71 mg/dL (ref >39)
HDL Particle Number: 33.5 umol/L (ref >=30.5)
LDL Particle Number: 1482 nmol/L — ABNORMAL HIGH (ref <1000)
LDL SIZE: 21.7 nm (ref >20.5)
LDL-C: 132 mg/dL — ABNORMAL HIGH (ref 0–99)
LP-IR Score: <25 (ref <=45)
SMALL LDL PARTICLE NUMBER: 232 nmol/L (ref <=527)
TRIGLYCERIDES BY NMR: 90 mg/dL (ref 0–149)

## 2017-03-31 LAB — MAGNESIUM: MAGNESIUM: 2.3 mg/dL (ref 1.6–2.3)

## 2017-03-31 LAB — THYROID PANEL WITH TSH
FREE THYROXINE INDEX: 2 (ref 1.2–4.9)
T3 Uptake Ratio: 28 % (ref 24–39)
T4, Total: 7.1 ug/dL (ref 4.5–12.0)
TSH: 2 u[IU]/mL (ref 0.450–4.500)

## 2017-03-31 LAB — VITAMIN D 25 HYDROXY (VIT D DEFICIENCY, FRACTURES): VIT D 25 HYDROXY: 43.6 ng/mL (ref 30.0–100.0)

## 2017-03-31 LAB — VITAMIN B12: VITAMIN B 12: 335 pg/mL (ref 232–1245)

## 2017-03-31 LAB — URIC ACID: Uric Acid: 3.3 mg/dL (ref 2.5–7.1)

## 2017-04-03 ENCOUNTER — Other Ambulatory Visit: Payer: Self-pay | Admitting: *Deleted

## 2017-04-03 DIAGNOSIS — R5383 Other fatigue: Secondary | ICD-10-CM

## 2017-04-03 MED ORDER — CYANOCOBALAMIN 1000 MCG/ML IJ SOLN
1000.0000 ug | INTRAMUSCULAR | Status: AC
  Administered 2017-04-04 – 2018-03-22 (×12): 1000 ug via INTRAMUSCULAR

## 2017-04-03 MED ORDER — CYANOCOBALAMIN 1000 MCG/ML IJ SOLN: 1000.0000 ug | INTRAMUSCULAR | Status: DC

## 2017-04-03 NOTE — Progress Notes (Signed)
b12

## 2017-04-04 ENCOUNTER — Ambulatory Visit (INDEPENDENT_AMBULATORY_CARE_PROVIDER_SITE_OTHER): Payer: Medicare Other | Admitting: *Deleted

## 2017-04-04 DIAGNOSIS — E538 Deficiency of other specified B group vitamins: Secondary | ICD-10-CM | POA: Diagnosis not present

## 2017-04-04 NOTE — Progress Notes (Signed)
Patient tolerated injection well.

## 2017-04-12 ENCOUNTER — Other Ambulatory Visit: Payer: Self-pay | Admitting: Family Medicine

## 2017-05-07 ENCOUNTER — Ambulatory Visit (INDEPENDENT_AMBULATORY_CARE_PROVIDER_SITE_OTHER): Payer: Medicare Other | Admitting: *Deleted

## 2017-05-07 DIAGNOSIS — E538 Deficiency of other specified B group vitamins: Secondary | ICD-10-CM | POA: Diagnosis not present

## 2017-05-07 DIAGNOSIS — R5383 Other fatigue: Secondary | ICD-10-CM | POA: Diagnosis not present

## 2017-05-08 ENCOUNTER — Ambulatory Visit: Payer: Medicare Other

## 2017-05-28 ENCOUNTER — Other Ambulatory Visit: Payer: Self-pay | Admitting: Family Medicine

## 2017-05-31 ENCOUNTER — Ambulatory Visit (INDEPENDENT_AMBULATORY_CARE_PROVIDER_SITE_OTHER): Payer: Medicare Other | Admitting: Family Medicine

## 2017-05-31 ENCOUNTER — Ambulatory Visit (INDEPENDENT_AMBULATORY_CARE_PROVIDER_SITE_OTHER): Payer: Medicare Other

## 2017-05-31 ENCOUNTER — Encounter: Payer: Self-pay | Admitting: Family Medicine

## 2017-05-31 VITALS — BP 98/57 | HR 70 | Temp 97.8°F | Ht 65.0 in | Wt 123.0 lb

## 2017-05-31 DIAGNOSIS — S9031XA Contusion of right foot, initial encounter: Secondary | ICD-10-CM

## 2017-05-31 DIAGNOSIS — M79674 Pain in right toe(s): Secondary | ICD-10-CM

## 2017-05-31 DIAGNOSIS — S90121A Contusion of right lesser toe(s) without damage to nail, initial encounter: Secondary | ICD-10-CM

## 2017-05-31 NOTE — Patient Instructions (Signed)
Use ace wrap Elevate when possible Use warm wet compresses not hot Take Tylenol for pain and give this some time and it should resolve We will send this off to the radiologist make sure there is no fracture and we'll call you soon as those results become available

## 2017-05-31 NOTE — Progress Notes (Signed)
Subjective:    Patient ID: Rose Martinez, female    DOB: [birth year], 82 y.o.   MRN: [id number]  HPI Patient here today for right foot toe injury that happened about 1 and 1/2 weeks ago.    Patient Active Problem List   Diagnosis Date Noted  . Abnormal laboratory test result 05/25/2016  . Thrombocytopenia ([place]) 07/16/2015  . Adjustment disorder with mixed anxiety and depressed mood 06/03/2015  . Vitamin D deficiency 06/03/2015  . Hyperlipidemia 06/03/2015  . Osteoporosis 01/20/2015  . Recurrent urinary tract infection 09/03/2013  . Chest pain 08/17/2011  . Preventive measure 08/16/2011  . HYPOTENSION, UNSPECIFIED 07/13/2010  . MITRAL REGURGITATION 02/02/2010  . PALPITATIONS 02/02/2010   Outpatient Encounter Prescriptions as of 05/31/2017  Medication Sig  . ALPRAZolam (XANAX) 0.25 MG tablet TAKE (1/2) TABLET THREE TIMES DAILY.  Marland Kitchen aspirin 81 MG tablet Take 81 mg by mouth daily.  . betamethasone, augmented, (DIPROLENE) 0.05 % lotion Apply topically 2 (two) times daily.  . cholecalciferol (VITAMIN D) 1000 UNITS tablet Take 2,000 Units by mouth daily.   . fluticasone (FLONASE) 50 MCG/ACT nasal spray Place 1 spray into both nostrils daily.  . fluticasone (FLONASE) 50 MCG/ACT nasal spray SPRAY 1 SPRAY IN EACH NOSTRIL ONCE DAILY.  . Probiotic Product (ALIGN PO) Take 1 capsule by mouth daily.  . propranolol (INDERAL) 20 MG tablet TAKE (1) TABLET DAILY AS DIRECTED.  . [DISCONTINUED] Thymol Iodide POWD by Does not apply route.   Facility-Administered Encounter Medications as of 05/31/2017  Medication  . cyanocobalamin ((VITAMIN B-12)) injection 1,000 mcg      Review of Systems  Constitutional: Negative.   HENT: Negative.   Eyes: Negative.   Respiratory: Negative.   Cardiovascular: Negative.   Gastrointestinal: Negative.   Endocrine: Negative.   Genitourinary: Negative.   Musculoskeletal: Negative.        Bruising and pain - right foot toes  Skin: Negative.     Allergic/Immunologic: Negative.   Neurological: Negative.   Hematological: Negative.   Psychiatric/Behavioral: Negative.        Objective:   Physical Exam  Constitutional: She is oriented to person, place, and time. She appears well-developed and well-nourished. She appears distressed.  Musculoskeletal: Normal range of motion. She exhibits edema and tenderness.  There is bruising and slight swelling of the right foot at the base of the second third and fourth toes. The fourth toe slightly more swollen.  Neurological: She is alert and oriented to person, place, and time.  Skin: Skin is warm and dry. No rash noted.  Psychiatric: She has a normal mood and affect. Her behavior is normal. Judgment and thought content normal.  Nursing note and vitals reviewed.   BP (!) 98/57 (BP Location: Left Arm)   Pulse 70   Temp 97.8 F (36.6 C) (Oral)   Ht 5\' 5"  (1.651 m)   Wt 123 lb (55.8 kg)   BMI 20.47 kg/m    X-ray with results pending    Assessment & Plan:  1. Toe pain, right - DG Foot Complete Right; Future  2. Contusion of right foot including toes, initial encounter -X-ray with results pending -Take Tylenol as needed for pain -Use warm wet compresses and elevation -Reduce activity for a few days as far as walking is concerned -Use Ace bandage for support and wear firm soled shoes  Patient Instructions  Use ace wrap Elevate when possible Use warm wet compresses not hot Take Tylenol for pain and give this some time  and it should resolve We will send this off to the radiologist make sure there is no fracture and we'll call you soon as those results become available  Arrie Senate MD

## 2017-06-08 ENCOUNTER — Ambulatory Visit (INDEPENDENT_AMBULATORY_CARE_PROVIDER_SITE_OTHER): Payer: Medicare Other | Admitting: *Deleted

## 2017-06-08 DIAGNOSIS — R5383 Other fatigue: Secondary | ICD-10-CM

## 2017-06-08 DIAGNOSIS — E538 Deficiency of other specified B group vitamins: Secondary | ICD-10-CM | POA: Diagnosis not present

## 2017-06-22 DIAGNOSIS — Z961 Presence of intraocular lens: Secondary | ICD-10-CM | POA: Diagnosis not present

## 2017-06-22 DIAGNOSIS — H04121 Dry eye syndrome of right lacrimal gland: Secondary | ICD-10-CM | POA: Diagnosis not present

## 2017-06-25 DIAGNOSIS — L720 Epidermal cyst: Secondary | ICD-10-CM | POA: Diagnosis not present

## 2017-06-25 DIAGNOSIS — D485 Neoplasm of uncertain behavior of skin: Secondary | ICD-10-CM | POA: Diagnosis not present

## 2017-06-25 DIAGNOSIS — C44719 Basal cell carcinoma of skin of left lower limb, including hip: Secondary | ICD-10-CM | POA: Diagnosis not present

## 2017-06-25 DIAGNOSIS — D1801 Hemangioma of skin and subcutaneous tissue: Secondary | ICD-10-CM | POA: Diagnosis not present

## 2017-06-25 DIAGNOSIS — L218 Other seborrheic dermatitis: Secondary | ICD-10-CM | POA: Diagnosis not present

## 2017-06-25 DIAGNOSIS — L918 Other hypertrophic disorders of the skin: Secondary | ICD-10-CM | POA: Diagnosis not present

## 2017-06-25 DIAGNOSIS — L812 Freckles: Secondary | ICD-10-CM | POA: Diagnosis not present

## 2017-06-25 DIAGNOSIS — L821 Other seborrheic keratosis: Secondary | ICD-10-CM | POA: Diagnosis not present

## 2017-07-09 ENCOUNTER — Ambulatory Visit (INDEPENDENT_AMBULATORY_CARE_PROVIDER_SITE_OTHER): Payer: Medicare Other | Admitting: *Deleted

## 2017-07-09 DIAGNOSIS — E538 Deficiency of other specified B group vitamins: Secondary | ICD-10-CM | POA: Diagnosis not present

## 2017-08-10 ENCOUNTER — Ambulatory Visit (INDEPENDENT_AMBULATORY_CARE_PROVIDER_SITE_OTHER): Payer: Medicare Other | Admitting: *Deleted

## 2017-08-10 DIAGNOSIS — E538 Deficiency of other specified B group vitamins: Secondary | ICD-10-CM

## 2017-08-10 DIAGNOSIS — R5383 Other fatigue: Secondary | ICD-10-CM | POA: Diagnosis not present

## 2017-08-10 NOTE — Progress Notes (Signed)
Pt given Cyanocobalamin inj Tolerated well 

## 2017-08-13 DIAGNOSIS — L218 Other seborrheic dermatitis: Secondary | ICD-10-CM | POA: Diagnosis not present

## 2017-08-13 DIAGNOSIS — L570 Actinic keratosis: Secondary | ICD-10-CM | POA: Diagnosis not present

## 2017-08-13 DIAGNOSIS — L239 Allergic contact dermatitis, unspecified cause: Secondary | ICD-10-CM | POA: Diagnosis not present

## 2017-08-13 DIAGNOSIS — L821 Other seborrheic keratosis: Secondary | ICD-10-CM | POA: Diagnosis not present

## 2017-08-13 DIAGNOSIS — Z85828 Personal history of other malignant neoplasm of skin: Secondary | ICD-10-CM | POA: Diagnosis not present

## 2017-08-23 ENCOUNTER — Telehealth: Payer: Self-pay | Admitting: Family Medicine

## 2017-08-23 NOTE — Telephone Encounter (Signed)
Pt states that her legs feel weak and head feels full  This has been going on for years - wants appt    appt made for MON

## 2017-08-23 NOTE — Telephone Encounter (Signed)
Please advise 

## 2017-08-27 ENCOUNTER — Encounter: Payer: Self-pay | Admitting: Family Medicine

## 2017-08-27 ENCOUNTER — Ambulatory Visit (INDEPENDENT_AMBULATORY_CARE_PROVIDER_SITE_OTHER): Payer: Medicare Other | Admitting: Family Medicine

## 2017-08-27 VITALS — BP 98/65 | HR 69 | Temp 97.1°F | Ht 65.0 in | Wt 121.0 lb

## 2017-08-27 DIAGNOSIS — E7800 Pure hypercholesterolemia, unspecified: Secondary | ICD-10-CM | POA: Diagnosis not present

## 2017-08-27 DIAGNOSIS — D696 Thrombocytopenia, unspecified: Secondary | ICD-10-CM | POA: Diagnosis not present

## 2017-08-27 DIAGNOSIS — E559 Vitamin D deficiency, unspecified: Secondary | ICD-10-CM | POA: Diagnosis not present

## 2017-08-27 DIAGNOSIS — R002 Palpitations: Secondary | ICD-10-CM | POA: Diagnosis not present

## 2017-08-27 DIAGNOSIS — E538 Deficiency of other specified B group vitamins: Secondary | ICD-10-CM

## 2017-08-27 DIAGNOSIS — R29898 Other symptoms and signs involving the musculoskeletal system: Secondary | ICD-10-CM | POA: Diagnosis not present

## 2017-08-27 DIAGNOSIS — F419 Anxiety disorder, unspecified: Secondary | ICD-10-CM

## 2017-08-27 DIAGNOSIS — J301 Allergic rhinitis due to pollen: Secondary | ICD-10-CM | POA: Diagnosis not present

## 2017-08-27 MED ORDER — SULFAMETHOXAZOLE-TRIMETHOPRIM 800-160 MG PO TABS: 1.0000 | ORAL_TABLET | Freq: Two times a day (BID) | ORAL | 0 refills | Status: DC

## 2017-08-27 NOTE — Patient Instructions (Signed)
Continue with nasal saline Restart Flonase and do two sprays each nostril daily at bedtime Discontinue the use of the overhead fan Continue to drink plenty of fluids and stay well hydrated Take sulfa antibiotic with food twice daily for infection until completed Remember that taking Sudafed could increase the risk of more palpitations and especially if you add caffeine to this.

## 2017-08-27 NOTE — Progress Notes (Signed)
Subjective:    Patient ID: Mardene Speak, female    DOB: 07/12/1935, 82 y.o.   MRN: [id number]  HPI Patient here today for possible sinus infection. She states that her legs feel weak and she has a "full" feeling. The patient denies any fever or cough or purulent sputum. She says her legs feel weak and she has been somewhat lightheaded. Her head feels full. She is concerned that she may have a sinus infection. She does have a long history of allergic rhinitis. She is been using her overhead fan over the past month. We have talked to her about this before. She has not been using the Flonase regularly but has been using saline nose spray. She denies any chest pain or shortness of breath. She denies any palpitations despite using over-the-counter Sudafed and high reminded her that this could cause palpitations and she is aware of that. She says the Sudafed may have helped her some. She denies any trouble with nausea vomiting diarrhea blood in the stool or black tarry bowel movements. She is passing her water well without burning pain or frequency other than nocturia. She has not eaten this morning so we will go ahead and do her lab studies today.     Patient Active Problem List   Diagnosis Date Noted  . Abnormal laboratory test result 05/25/2016  . Thrombocytopenia ([place]) 07/16/2015  . Adjustment disorder with mixed anxiety and depressed mood 06/03/2015  . Vitamin D deficiency 06/03/2015  . Hyperlipidemia 06/03/2015  . Osteoporosis 01/20/2015  . Recurrent urinary tract infection 09/03/2013  . Chest pain 08/17/2011  . Preventive measure 08/16/2011  . HYPOTENSION, UNSPECIFIED 07/13/2010  . MITRAL REGURGITATION 02/02/2010  . PALPITATIONS 02/02/2010   Outpatient Encounter Prescriptions as of 08/27/2017  Medication Sig  . ALPRAZolam (XANAX) 0.25 MG tablet TAKE (1/2) TABLET THREE TIMES DAILY.  Marland Kitchen aspirin 81 MG tablet Take 81 mg by mouth daily.  . betamethasone, augmented, (DIPROLENE) 0.05 %  lotion Apply topically 2 (two) times daily.  . cholecalciferol (VITAMIN D) 1000 UNITS tablet Take 2,000 Units by mouth daily.   . fluticasone (FLONASE) 50 MCG/ACT nasal spray SPRAY 1 SPRAY IN EACH NOSTRIL ONCE DAILY.  . Probiotic Product (ALIGN PO) Take 1 capsule by mouth daily.  . propranolol (INDERAL) 20 MG tablet TAKE (1) TABLET DAILY AS DIRECTED.  . [DISCONTINUED] fluticasone (FLONASE) 50 MCG/ACT nasal spray Place 1 spray into both nostrils daily.   Facility-Administered Encounter Medications as of 08/27/2017  Medication  . cyanocobalamin ((VITAMIN B-12)) injection 1,000 mcg     Review of Systems  HENT: Positive for sinus pressure (head feels "full").   Eyes: Negative.   Respiratory: Negative.   Cardiovascular: Negative.   Gastrointestinal: Negative.   Endocrine: Negative.   Genitourinary: Negative.   Musculoskeletal: Negative.   Skin: Negative.   Allergic/Immunologic: Negative.   Neurological: Positive for weakness (legs - bilateral at times).  Hematological: Negative.   Psychiatric/Behavioral: Negative.        Objective:   Physical Exam  Constitutional: She is oriented to person, place, and time. She appears well-developed and well-nourished. No distress.  The patient is pleasant and alert and in no acute distress  HENT:  Head: Normocephalic and atraumatic.  Right Ear: External ear normal.  Left Ear: External ear normal.  Mouth/Throat: Oropharynx is clear and moist. No oropharyngeal exudate.  No sinus pressure or tenderness with palpation Nasal pallor and congestion left greater than right  Eyes: Pupils are equal, round, and reactive to  light. Conjunctivae and EOM are normal. Right eye exhibits no discharge. Left eye exhibits no discharge. No scleral icterus.  Neck: Normal range of motion. Neck supple. No thyromegaly present.  No thyromegaly anterior cervical adenopathy or bruits  Cardiovascular: Normal rate, regular rhythm, normal heart sounds and intact distal pulses.    No murmur heard. The heart is regular at 72/m  Pulmonary/Chest: Effort normal and breath sounds normal. No respiratory distress. She has no wheezes. She has no rales.  Clear anteriorly and posteriorly and no increased congestion with coughing  Abdominal: Soft. Bowel sounds are normal. She exhibits no mass. There is no tenderness. There is no rebound and no guarding.  Minimal suprapubic tenderness with no liver or spleen enlargement no bruits and no masses  Musculoskeletal: Normal range of motion. She exhibits no edema.  Lymphadenopathy:    She has no cervical adenopathy.  Neurological: She is alert and oriented to person, place, and time. She has normal reflexes. No cranial nerve deficit.  Skin: Skin is warm and dry. No rash noted.  Psychiatric: She has a normal mood and affect. Her behavior is normal. Judgment and thought content normal.  Nursing note and vitals reviewed.  BP 98/65 (BP Location: Left Arm)   Pulse 69   Temp (!) 97.1 F (36.2 C) (Oral)   Ht _0  (1.651 m)   Wt 121 lb (54.9 kg)   BMI 20.14 kg/m         Assessment & Plan:  1. Pure hypercholesterolemia -Continue with aggressive therapeutic lifestyle changes. Patient has been intolerant to even low-dose statins. - BMP8+EGFR - CBC with Differential/Platelet - Hepatic function panel - Lipid panel - DG Chest 2 View; Future  2. Vitamin D deficiency -Continue current treatment pending results of lab work - CBC with Differential/Platelet - VITAMIN D 25 Hydroxy (Vit-D Deficiency, Fractures)  3. Thrombocytopenia ([facility]) -No increased bleeding or bruising issues noted - CBC with Differential/Platelet  4. Low vitamin B12 level -Continue with B12 injections pending results of lab work - CBC with Differential/Platelet - Vitamin B12  5. Weakness of both lower extremities - BMP8+EGFR - Thyroid Panel With TSH - Vitamin B12 - DG Chest 2 View; Future  6. Seasonal allergic rhinitis due to pollen -Decrease the use  of the overhead fan, add back Flonase and continue with saline nose spray -Consider air purifier  7. Palpitations -Avoid caffeine and remember that adding Sudafed to the treatment regimen could increase palpitations  8. Anxiety disorder, unspecified type -Xanax as needed  Meds ordered this encounter  Medications  . sulfamethoxazole-trimethoprim (BACTRIM DS) 800-160 MG tablet    Sig: Take 1 tablet by mouth 2 (two) times daily.    Dispense:  20 tablet    Refill:  0   Patient Instructions  Continue with nasal saline Restart Flonase and do two sprays each nostril daily at bedtime Discontinue the use of the overhead fan Continue to drink plenty of fluids and stay well hydrated Take sulfa antibiotic with food twice daily for infection until completed Remember that taking Sudafed could increase the risk of more palpitations and especially if you add caffeine to this.  Arrie Senate MD

## 2017-08-28 LAB — CBC WITH DIFFERENTIAL/PLATELET
BASOS: 0 %
Basophils Absolute: 0 10*3/uL (ref 0.0–0.2)
EOS (ABSOLUTE): 0.1 10*3/uL (ref 0.0–0.4)
EOS: 2 %
HEMATOCRIT: 41 % (ref 34.0–46.6)
Hemoglobin: 13.8 g/dL (ref 11.1–15.9)
IMMATURE GRANULOCYTES: 0 %
Immature Grans (Abs): 0 10*3/uL (ref 0.0–0.1)
LYMPHS: 32 %
Lymphocytes Absolute: 1.5 10*3/uL (ref 0.7–3.1)
MCH: 33.6 pg — ABNORMAL HIGH (ref 26.6–33.0)
MCHC: 33.7 g/dL (ref 31.5–35.7)
MCV: 100 fL — AB (ref 79–97)
Monocytes Absolute: 0.3 10*3/uL (ref 0.1–0.9)
Monocytes: 7 %
NEUTROS PCT: 59 %
Neutrophils Absolute: 2.7 10*3/uL (ref 1.4–7.0)
Platelets: 179 10*3/uL (ref 150–379)
RBC: 4.11 x10E6/uL (ref 3.77–5.28)
RDW: 12.4 % (ref 12.3–15.4)
WBC: 4.6 10*3/uL (ref 3.4–10.8)

## 2017-08-28 LAB — HEPATIC FUNCTION PANEL
ALK PHOS: 77 IU/L (ref 39–117)
ALT: 10 IU/L (ref 0–32)
AST: 14 IU/L (ref 0–40)
Albumin: 4.4 g/dL (ref 3.5–4.8)
BILIRUBIN, DIRECT: 0.13 mg/dL (ref 0.00–0.40)
Bilirubin Total: 0.5 mg/dL (ref 0.0–1.2)
TOTAL PROTEIN: 6.7 g/dL (ref 6.0–8.5)

## 2017-08-28 LAB — BMP8+EGFR
BUN/Creatinine Ratio: 13 (ref 12–28)
BUN: 9 mg/dL (ref 8–27)
CALCIUM: 9.6 mg/dL (ref 8.7–10.3)
CO2: 26 mmol/L (ref 20–29)
CREATININE: 0.71 mg/dL (ref 0.57–1.00)
Chloride: 104 mmol/L (ref 96–106)
GFR calc Af Amer: 94 mL/min/{1.73_m2} (ref >59)
GFR, EST NON AFRICAN AMERICAN: 81 mL/min/{1.73_m2} (ref >59)
Glucose: 82 mg/dL (ref 65–99)
Potassium: 4.8 mmol/L (ref 3.5–5.2)
Sodium: 145 mmol/L — ABNORMAL HIGH (ref 134–144)

## 2017-08-28 LAB — THYROID PANEL WITH TSH
FREE THYROXINE INDEX: 2.2 (ref 1.2–4.9)
T3 Uptake Ratio: 27 % (ref 24–39)
T4, Total: 8 ug/dL (ref 4.5–12.0)
TSH: 2.59 u[IU]/mL (ref 0.450–4.500)

## 2017-08-28 LAB — VITAMIN B12: Vitamin B-12: 755 pg/mL (ref 232–1245)

## 2017-08-28 LAB — LIPID PANEL
Chol/HDL Ratio: 3.6 ratio (ref 0.0–4.4)
Cholesterol, Total: 233 mg/dL — ABNORMAL HIGH (ref 100–199)
HDL: 64 mg/dL (ref >39)
LDL Calculated: 145 mg/dL — ABNORMAL HIGH (ref 0–99)
Triglycerides: 121 mg/dL (ref 0–149)
VLDL Cholesterol Cal: 24 mg/dL (ref 5–40)

## 2017-08-28 LAB — VITAMIN D 25 HYDROXY (VIT D DEFICIENCY, FRACTURES): VIT D 25 HYDROXY: 54 ng/mL (ref 30.0–100.0)

## 2017-09-06 ENCOUNTER — Telehealth: Payer: Self-pay | Admitting: Family Medicine

## 2017-09-06 DIAGNOSIS — J301 Allergic rhinitis due to pollen: Secondary | ICD-10-CM | POA: Diagnosis not present

## 2017-09-06 DIAGNOSIS — H6121 Impacted cerumen, right ear: Secondary | ICD-10-CM | POA: Diagnosis not present

## 2017-09-06 NOTE — Telephone Encounter (Signed)
Pt needs copy of labs sent to 2 other dr --- Claria Dice and [place] - will do

## 2017-09-06 NOTE — Telephone Encounter (Signed)
Pt wouldn't talk to me, only wants to speak to [place] and I told her it might be a little bit since you were seeing patients and pt voiced understanding.

## 2017-09-11 ENCOUNTER — Ambulatory Visit (INDEPENDENT_AMBULATORY_CARE_PROVIDER_SITE_OTHER): Payer: Medicare Other | Admitting: Allergy and Immunology

## 2017-09-11 ENCOUNTER — Encounter: Payer: Self-pay | Admitting: Allergy and Immunology

## 2017-09-11 ENCOUNTER — Ambulatory Visit (INDEPENDENT_AMBULATORY_CARE_PROVIDER_SITE_OTHER): Payer: Medicare Other | Admitting: *Deleted

## 2017-09-11 VITALS — BP 110/72 | HR 61 | Resp 17 | Ht 64.5 in | Wt 123.4 lb

## 2017-09-11 DIAGNOSIS — R5383 Other fatigue: Secondary | ICD-10-CM

## 2017-09-11 DIAGNOSIS — J3089 Other allergic rhinitis: Secondary | ICD-10-CM

## 2017-09-11 DIAGNOSIS — E538 Deficiency of other specified B group vitamins: Secondary | ICD-10-CM

## 2017-09-11 MED ORDER — MONTELUKAST SODIUM 10 MG PO TABS: 10.0000 mg | ORAL_TABLET | Freq: Every day | ORAL | 5 refills | Status: DC

## 2017-09-11 NOTE — Progress Notes (Signed)
Dear Dr. Laurance Flatten,  Thank you for referring Rose Martinez to the [facility] on 09/11/2017.   Below is a summation of this patient's evaluation and recommendations.  Thank you for your referral. I will keep you informed about this patient's response to treatment.   If you have any questions please do not hesitate to contact me.   Sincerely,  Jiles Prows, MD Allergy / Immunology [place]   ______________________________________________________________________    NEW PATIENT NOTE  Referring Provider: Chipper Herb, MD Primary Provider: Chipper Herb, MD Date of office visit: 09/11/2017    Subjective:   Chief Complaint:  Rose Martinez (DOB: 04/01/1935) is a 82 y.o. female who presents to the clinic on 09/11/2017 with a chief complaint of Allergies (C/O sinus pressue) .     HPI: Rose Martinez presents to this clinic in evaluation of episodic airway and head issues. Apparently twice a year usually during March and early fall she will develop these episodes that will last approximately 3-4 weeks per episode over the course of the past 3 years.  Her episodes are described as "tightness" across her eyes and forehead with a pressure sensation and feeling lightheaded but not vertiginous but having some difficulty walking without swaying and lots of lost energy. When she looks up or she bends over she gets lightheaded during these episodes. She does not appear to have any fever or anosmia or significant headaches or hearing loss or tinnitus associated with these issues.  Usually she is treated with antibiotics and various other symptomatic medications. She does not use any medications in a preventative manner. She was given Flonase in the past over the course of the past 6 months but she does not really use this medication.  She just finished one of her episodes that lasted about 3 weeks.  During that timeframe she visited with Dr. Ernesto Rutherford and also had administration of Bactrim which gave rise to GI upset and subsequently azithromycin. She took Sudafed during this episode and she thinks that Sudafed did help.  There are no obvious provoking factors giving rise to these issues. She does not really have a history of atopic disease that preceded these events.  Past Medical History:  Diagnosis Date  . Anxiety   . Hyperlipidemia    Mild  . Osteoporosis   . Palpitations     Past Surgical History:  Procedure Laterality Date  . CYSTECTOMY     Breast  . EYE SURGERY    . OOPHORECTOMY      Allergies as of 09/11/2017      Reactions   Cephalexin Other (See Comments)   Pt does not remember   Penicillins Other (See Comments)   Pt's mother was severly allergic, so she has never taken PCN.   Statins    myalgias      Medication List      ALIGN PO Take 1 capsule by mouth daily.   ALPRAZolam 0.25 MG tablet Commonly known as:  XANAX TAKE (1/2) TABLET THREE TIMES DAILY.   aspirin 81 MG tablet Take 81 mg by mouth daily.   betamethasone (augmented) 0.05 % lotion Commonly known as:  DIPROLENE Apply topically 2 (two) times daily.   cholecalciferol 1000 units tablet Commonly known as:  VITAMIN D Take 2,000 Units by mouth daily.   fluticasone 50 MCG/ACT nasal spray Commonly known as:  FLONASE SPRAY 1 SPRAY  IN EACH NOSTRIL ONCE DAILY.   propranolol 20 MG tablet Commonly known as:  INDERAL TAKE (1) TABLET DAILY AS DIRECTED.   sulfamethoxazole-trimethoprim 800-160 MG tablet Commonly known as:  BACTRIM DS Take 1 tablet by mouth 2 (two) times daily.       Review of systems negative except as noted in HPI / PMHx or noted below:  Review of Systems  Constitutional: Negative.   HENT: Negative.   Eyes: Negative.   Respiratory: Negative.   Cardiovascular: Negative.   Gastrointestinal: Negative.   Genitourinary: Negative.   Musculoskeletal: Negative.   Skin:  Negative.   Neurological: Negative.   Endo/Heme/Allergies: Negative.   Psychiatric/Behavioral: Negative.     Family History  Problem Relation Age of Onset  . Heart attack Father   . Other Mother        ployarthritis Nodosa    Social History   Social History  . Marital status: Divorced    Spouse name: N/A  . Number of children: 1  . Years of education: N/A   Occupational History  . Not on file.   Social History Main Topics  . Smoking status: Former Smoker    Quit date: 03/27/1957  . Smokeless tobacco: Never Used  . Alcohol use No  . Drug use: No  . Sexual activity: Not on file   Other Topics Concern  . Not on file   Social History Narrative   Her one child and 2 grandchildren live in [place].    Environmental and Social history  Lives in a house with a dry environment, a dog located inside the household, would in the bedroom floor, no plastic on the bed, no plastic on the pillow, and no smokers located inside the household.  Objective:   Vitals:   09/11/17 1342  BP: 110/72  Pulse: 61  Resp: 17  SpO2: 99%   Height: 5' 4.5" (163.8 cm) Weight: 123 lb 6.4 oz (56 kg)  Physical Exam  Constitutional: She is well-developed, well-nourished, and in no distress.  HENT:  Head: Normocephalic. Head is without right periorbital erythema and without left periorbital erythema.  Right Ear: Tympanic membrane, external ear and ear canal normal.  Left Ear: Tympanic membrane, external ear and ear canal normal.  Nose: Nose normal. No mucosal edema or rhinorrhea.  Mouth/Throat: Uvula is midline, oropharynx is clear and moist and mucous membranes are normal. No oropharyngeal exudate.  Eyes: Pupils are equal, round, and reactive to light. Conjunctivae and lids are normal.  Neck: Trachea normal. No tracheal tenderness present. No tracheal deviation present. No thyromegaly present.  Cardiovascular: Normal rate, regular rhythm, S1 normal, S2 normal and normal heart sounds.   No  murmur heard. Pulmonary/Chest: Effort normal and breath sounds normal. No stridor. No tachypnea. No respiratory distress. She has no wheezes. She has no rales. She exhibits no tenderness.  Abdominal: Soft. She exhibits no distension and no mass. There is no hepatosplenomegaly. There is no tenderness. There is no rebound and no guarding.  Musculoskeletal: She exhibits no edema or tenderness.  Lymphadenopathy:       Head (right side): No tonsillar adenopathy present.       Head (left side): No tonsillar adenopathy present.    She has no cervical adenopathy.    She has no axillary adenopathy.  Neurological: She is alert. Gait normal.  Skin: No rash noted. She is not diaphoretic. No erythema. No pallor. Nails show no clubbing.  Psychiatric: Mood and affect normal.    Diagnostics: Allergy skin  tests were performed. She did not demonstrate any hypersensitivity against a screening panel of aeroallergens or foods.  Assessment and Plan:    1. Other allergic rhinitis     1. Allergen avoidance measures?  2. "Action plan" for flare up:   A. nasal saline several times a day  B. loratadine 10 mg twice a day  C. Mucinex D twice a day (contains sudafed)  D. montelukast 10 mg once a day  3. Further evaluation and treatment?   Although there is the possibility that atopic disease may be triggering off some of Izora Gala Lee's episodic upper airway and ear issues I suspect that she is more likely developing viral respiratory tract infections with significant inflammation of her upper airway mucosa and eustachian tubes with secondary inner ear dysfunction. I have given her a plan to initiate during her next "flareup" and she will contact me regarding her response to this approach. She has used a decongestant in the past without an adverse effect but I did have a talk with her today about how pseudoephedrine can cause problems with hypertension and increased heart rate and other cardiovascular abnormalities.  Further evaluation and treatment will be based upon her response.  Jiles Prows, MD Allergy / Immunology [facility]

## 2017-09-11 NOTE — Progress Notes (Signed)
Pt given cyanocobalamin inj Tolerated well 

## 2017-09-11 NOTE — Patient Instructions (Addendum)
  1. Allergen avoidance measures?  2. "Action plan" for flare up:   A. nasal saline several times a day  B. loratadine 10 mg twice a day  C. Mucinex D twice a day (contains sudafed)  D. montelukast 10 mg once a day  3. Further evaluation and treatment?

## 2017-09-20 ENCOUNTER — Ambulatory Visit: Payer: Medicare Other | Admitting: Family Medicine

## 2017-09-20 NOTE — Progress Notes (Signed)
HPI The patient presents for followup of palpitations.  She has many somatic complaints.  She is not describing palpitations. However, she's having symptoms consistent with some sinus problems for which she's recently been treated. He describes some dizziness. She describes some slight abnormality of gait. She's not having any chest pressure, neck or arm discomfort. It's hard to understand whether there is any orthostatic symptoms. She returns from a notebook and has a long list of. In various complaints.  Allergies  Allergen Reactions  . Cephalexin Other (See Comments)    Pt does not remember   . Penicillins Other (See Comments)    Pt's mother was severly allergic, so she has never taken PCN.  . Statins     myalgias    Current Outpatient Prescriptions  Medication Sig Dispense Refill  . ALPRAZolam (XANAX) 0.25 MG tablet TAKE (1/2) TABLET THREE TIMES DAILY. 45 tablet 1  . aspirin 81 MG tablet Take 81 mg by mouth daily.    . cholecalciferol (VITAMIN D) 1000 UNITS tablet Take 2,000 Units by mouth daily.     . Probiotic Product (ALIGN PO) Take 1 capsule by mouth daily.    . propranolol (INDERAL) 20 MG tablet TAKE (1) TABLET DAILY AS DIRECTED. 30 tablet 4   Current Facility-Administered Medications  Medication Dose Route Frequency Provider Last Rate Last Dose  . cyanocobalamin ((VITAMIN B-12)) injection 1,000 mcg  1,000 mcg Intramuscular Q30 days Chipper Herb, MD   1,000 mcg at 09/11/17 1000    Past Medical History:  Diagnosis Date  . Anxiety   . Hyperlipidemia    Mild  . Osteoporosis   . Palpitations     Past Surgical History:  Procedure Laterality Date  . CYSTECTOMY     Breast  . EYE SURGERY    . OOPHORECTOMY      ROS:    As stated in the HPI and negative for all other systems.   PHYSICAL EXAM BP 118/76   Pulse 67   Ht 5' 4.5" (1.638 m)   Wt 122 lb (55.3 kg)   BMI 20.62 kg/m   GENERAL:  Well appearing NECK:  No jugular venous distention, waveform within  normal limits, carotid upstroke brisk and symmetric, no bruits, no thyromegaly LUNGS:  Clear to auscultation bilaterally CHEST:  Unremarkable HEART:  PMI not displaced or sustained,S1 and S2 within normal limits, no S3, no S4, no clicks, no rubs, no murmurs ABD:  Flat, positive bowel sounds normal in frequency in pitch, no bruits, no rebound, no guarding, no midline pulsatile mass, no hepatomegaly, no splenomegaly EXT:  2 plus pulses throughout, no edema, no cyanosis no clubbing    EKG:  Sinus rhythm, sinus arrhythmia, axis leftward, rate 55 intervals within normal limits, no acute ST-T wave changes.  Poor anterior R wave progression, low voltage    ASSESSMENT AND PLAN  PALPITATIONS:  This is not a significant complaint and no change in therapy is indicated.  LOW BP:  She's been told her blood pressures too low. However, is excellent today. It sometimes runs in the 90s. She's not orthostatic. At this point I told her to liberalize her salt but I don't think further testing or change in therapy is indicated.  DIZZINESS: She wondered if she needed a stress test. I'm sure her that there was no indication that this was suggestive of ischemic heart disease. Rather she should follow the advice in therapy provided her by her ENT and Chipper Herb, MD

## 2017-09-21 ENCOUNTER — Encounter: Payer: Self-pay | Admitting: Cardiology

## 2017-09-21 ENCOUNTER — Ambulatory Visit (INDEPENDENT_AMBULATORY_CARE_PROVIDER_SITE_OTHER): Payer: Medicare Other | Admitting: Cardiology

## 2017-09-21 VITALS — BP 118/76 | HR 67 | Ht 64.5 in | Wt 122.0 lb

## 2017-09-21 DIAGNOSIS — R002 Palpitations: Secondary | ICD-10-CM | POA: Diagnosis not present

## 2017-09-21 NOTE — Patient Instructions (Addendum)
Medication Instructions:  Continue current medications  If you need a refill on your cardiac medications before your next appointment, please call your pharmacy.  Labwork: None Ordered   Testing/Procedures: None Ordered  Follow-Up: Your physician wants you to follow-up in: 6 Months in [place]. You should receive a reminder letter in the mail two months in advance. If you do not receive a letter, please call our office [phone].    Thank you for choosing [facility]!!      

## 2017-09-22 ENCOUNTER — Encounter: Payer: Self-pay | Admitting: Cardiology

## 2017-09-25 NOTE — Addendum Note (Signed)
Addended by: Vennie Homans on: 09/25/2017 04:06 PM   Modules accepted: Orders

## 2017-10-12 ENCOUNTER — Ambulatory Visit (INDEPENDENT_AMBULATORY_CARE_PROVIDER_SITE_OTHER): Payer: Medicare Other | Admitting: *Deleted

## 2017-10-12 DIAGNOSIS — R5383 Other fatigue: Secondary | ICD-10-CM

## 2017-10-12 DIAGNOSIS — E538 Deficiency of other specified B group vitamins: Secondary | ICD-10-CM

## 2017-10-12 NOTE — Progress Notes (Signed)
Pt given Cyanocobalamin inj Tolerated well 

## 2017-10-16 ENCOUNTER — Ambulatory Visit: Payer: Medicare Other | Admitting: Allergy and Immunology

## 2017-10-16 ENCOUNTER — Encounter: Payer: Self-pay | Admitting: Allergy and Immunology

## 2017-10-16 VITALS — BP 116/70 | HR 71 | Resp 17

## 2017-10-16 DIAGNOSIS — J3089 Other allergic rhinitis: Secondary | ICD-10-CM

## 2017-10-16 MED ORDER — PSEUDOEPHEDRINE-GUAIFENESIN ER 60-600 MG PO TB12: 1.0000 | ORAL_TABLET | Freq: Two times a day (BID) | ORAL | 6 refills | Status: DC

## 2017-10-16 NOTE — Patient Instructions (Signed)
  1. Allergen avoidance measures?  2. "Action plan" for flare up:   A. nasal saline several times a day  B. loratadine 10 mg twice a day  C. Mucinex D twice a day (contains sudafed)  D. montelukast 10 mg once a day  3. Further evaluation and treatment?

## 2017-10-17 NOTE — Progress Notes (Signed)
Rose Martinez presents this clinic with several questions concerning therapy provided during her last evaluation. I spent approximately 15 minutes in discussion with her today answering all her questions. She has elected to use a antihistamine and mucolytic agent and decongestant and leukotriene modifier one time per day on a consistent basis to try and prevent her from developing her recurrent respiratory tract flareups. I will see her back in this clinic in 6 months or earlier if there is a problem.

## 2017-11-03 ENCOUNTER — Ambulatory Visit: Payer: Medicare Other

## 2017-11-03 ENCOUNTER — Ambulatory Visit (INDEPENDENT_AMBULATORY_CARE_PROVIDER_SITE_OTHER): Payer: Medicare Other | Admitting: Nurse Practitioner

## 2017-11-03 VITALS — BP 123/67 | HR 68 | Temp 97.0°F | Ht 64.0 in | Wt 123.0 lb

## 2017-11-03 DIAGNOSIS — K219 Gastro-esophageal reflux disease without esophagitis: Secondary | ICD-10-CM

## 2017-11-03 MED ORDER — OMEPRAZOLE 40 MG PO CPDR: 40.0000 mg | DELAYED_RELEASE_CAPSULE | Freq: Every day | ORAL | 3 refills | Status: DC

## 2017-11-03 NOTE — Progress Notes (Signed)
   Subjective:    Patient ID: Rose Martinez, female    DOB: [birth year], 82 y.o.   MRN: [id number]  HPI Patient comes in c/o epigastric pain. She is very strict on her diet, but this morning she ate a piece of sausage, pineapple juice , almond milk and oatmeal. Started having epigastric pain after eating. Is a burning sensation. She took a zantac and now burning is better. Pain was in eoigastric area and radiating outward.    Review of Systems  Constitutional: Negative for chills and fever.  HENT: Negative.   Respiratory: Negative for chest tightness and shortness of breath.   Cardiovascular: Negative for chest pain.  Gastrointestinal: Negative.   Genitourinary: Negative.   Neurological: Negative.   Psychiatric/Behavioral: Negative.   All other systems reviewed and are negative.      Objective:   Physical Exam  Constitutional: She appears well-developed and well-nourished. No distress.  Cardiovascular: Normal rate and regular rhythm.  Pulmonary/Chest: Effort normal and breath sounds normal.  Abdominal: Soft. Bowel sounds are normal. She exhibits no mass. There is no tenderness. There is no rebound and no guarding.  Skin: Skin is warm.  Psychiatric: She has a normal mood and affect. Her behavior is normal. Judgment and thought content normal.   BP 123/67   Pulse 68   Temp (!) 97 F (36.1 C) (Oral)   Ht 5\' 4"  (1.626 m)   Wt 123 lb (55.8 kg)   BMI 21.11 kg/m      Assessment & Plan:   1. Gastroesophageal reflux disease without esophagitis    Meds ordered this encounter  Medications  . omeprazole (PRILOSEC) 40 MG capsule    Sig: Take 1 capsule (40 mg total) daily by mouth.    Dispense:  30 capsule    Refill:  3    Order Specific Question:   Supervising Provider    Answer:   Evette Doffing, CAROL L [[id number]]    Avoid spicy foods Do not eat 2 hours prior to bedtime  Mary-Margaret Hassell Done, FNP

## 2017-11-03 NOTE — Patient Instructions (Signed)
Heartburn Heartburn is a type of pain or discomfort that can happen in the throat or chest. It is often described as a burning pain. It may also cause a bad taste in the mouth. Heartburn may feel worse when you lie down or bend over. It may be caused by stomach contents that move back up (reflux) into the tube that connects the mouth with the stomach (esophagus). Follow these instructions at home: Take these actions to lessen your discomfort and to help avoid problems. Diet  Follow a diet as told by your doctor. You may need to avoid foods and drinks such as: ? Coffee and tea (with or without caffeine). ? Drinks that contain alcohol. ? Energy drinks and sports drinks. ? Carbonated drinks or sodas. ? Chocolate and cocoa. ? Peppermint and mint flavorings. ? Garlic and onions. ? Horseradish. ? Spicy and acidic foods, such as peppers, chili powder, curry powder, vinegar, hot sauces, and BBQ sauce. ? Citrus fruit juices and citrus fruits, such as oranges, lemons, and limes. ? Tomato-based foods, such as red sauce, chili, salsa, and pizza with red sauce. ? Fried and fatty foods, such as donuts, french fries, potato chips, and high-fat dressings. ? High-fat meats, such as hot dogs, rib eye steak, sausage, ham, and bacon. ? High-fat dairy items, such as whole milk, butter, and cream cheese.  Eat small meals often. Avoid eating large meals.  Avoid drinking large amounts of liquid with your meals.  Avoid eating meals during the 2-3 hours before bedtime.  Avoid lying down right after you eat.  Do not exercise right after you eat. General instructions  Pay attention to any changes in your symptoms.  Take over-the-counter and prescription medicines only as told by your doctor. Do not take aspirin, ibuprofen, or other NSAIDs unless your doctor says it is okay.  Do not use any tobacco products, including cigarettes, chewing tobacco, and e-cigarettes. If you need help quitting, ask your  doctor.  Wear loose clothes. Do not wear anything tight around your waist.  Raise (elevate) the head of your bed about 6 inches (15 cm).  Try to lower your stress. If you need help doing this, ask your doctor.  If you are overweight, lose an amount of weight that is healthy for you. Ask your doctor about a safe weight loss goal.  Keep all follow-up visits as told by your doctor. This is important. Contact a doctor if:  You have new symptoms.  You lose weight and you do not know why it is happening.  You have trouble swallowing, or it hurts to swallow.  You have wheezing or a cough that keeps happening.  Your symptoms do not get better with treatment.  You have heartburn often for more than two weeks. Get help right away if:  You have pain in your arms, neck, jaw, teeth, or back.  You feel sweaty, dizzy, or light-headed.  You have chest pain or shortness of breath.  You throw up (vomit) and your throw up looks like blood or coffee grounds.  Your poop (stool) is bloody or black. This information is not intended to replace advice given to you by your health care provider. Make sure you discuss any questions you have with your health care provider. Document Released: 08/16/2011 Document Revised: 05/11/2016 Document Reviewed: 03/31/2015 Elsevier Interactive Patient Education  2018 Elsevier Inc.  

## 2017-11-05 ENCOUNTER — Encounter: Payer: Self-pay | Admitting: Family Medicine

## 2017-11-05 ENCOUNTER — Ambulatory Visit (INDEPENDENT_AMBULATORY_CARE_PROVIDER_SITE_OTHER): Payer: Medicare Other | Admitting: Family Medicine

## 2017-11-05 VITALS — BP 88/66 | HR 73 | Temp 97.5°F | Ht 64.0 in | Wt 121.0 lb

## 2017-11-05 DIAGNOSIS — K2900 Acute gastritis without bleeding: Secondary | ICD-10-CM

## 2017-11-05 NOTE — Progress Notes (Signed)
Subjective:    Patient ID: Rose Martinez, female    DOB: May 14, 1935, 82 y.o.   MRN: [id number]  HPI  Patient here today for follow up from OV this past [place] morning. She was seen here for GERD.  Patient is very stressed about this episode.  She does have ranitidine at home but has not been taking it on a regular basis.  She gives me a history of the diet which included hot sausage pineapple juice and frequent use of mints, at least 3 daily.  She has not had trouble with her stomach in a good while.  She did have an endoscopy 7 years ago and this was completely clear at that time.  We reviewed all of her other medicines and it appears that it was the perfect storm for her stomach that particular morning.  She has not had any trouble since.  She will from here on leave off the minutes and try to take a Zantac if she is going to eat sausage or something greasy.  She does not drink that much caffeine.  Her allergy symptoms are much improved.    Patient Active Problem List   Diagnosis Date Noted  . Abnormal laboratory test result 05/25/2016  . Thrombocytopenia ([place]) 07/16/2015  . Adjustment disorder with mixed anxiety and depressed mood 06/03/2015  . Vitamin D deficiency 06/03/2015  . Hyperlipidemia 06/03/2015  . Osteoporosis 01/20/2015  . Recurrent urinary tract infection 09/03/2013  . Chest pain 08/17/2011  . Preventive measure 08/16/2011  . HYPOTENSION, UNSPECIFIED 07/13/2010  . MITRAL REGURGITATION 02/02/2010  . PALPITATIONS 02/02/2010   Outpatient Encounter Medications as of 11/05/2017  Medication Sig  . ALPRAZolam (XANAX) 0.25 MG tablet TAKE (1/2) TABLET THREE TIMES DAILY.  [place] Kitchen aspirin 81 MG tablet Take 81 mg by mouth daily.  . cholecalciferol (VITAMIN D) 1000 UNITS tablet Take 2,000 Units by mouth daily.   Marland Kitchen loratadine (CLARITIN) 10 MG tablet Take 10 mg 2 (two) times daily by mouth.  . montelukast (SINGULAIR) 10 MG tablet   . NASAL SALINE NA Place into the nose.  .  Probiotic Product (ALIGN PO) Take 1 capsule by mouth daily.  . propranolol (INDERAL) 20 MG tablet TAKE (1) TABLET DAILY AS DIRECTED.  Marland Kitchen pseudoephedrine-guaifenesin (MUCINEX D) 60-600 MG 12 hr tablet Take 1 tablet by mouth 2 (two) times daily.  Marland Kitchen omeprazole (PRILOSEC) 40 MG capsule Take 1 capsule (40 mg total) daily by mouth. (Patient not taking: Reported on 11/05/2017)   Facility-Administered Encounter Medications as of 11/05/2017  Medication  . cyanocobalamin ((VITAMIN B-12)) injection 1,000 mcg     Review of Systems  Constitutional: Negative.   HENT: Negative.   Eyes: Negative.   Respiratory: Negative.   Cardiovascular: Negative.   Gastrointestinal: Negative.   Endocrine: Negative.   Genitourinary: Negative.   Musculoskeletal: Negative.   Skin: Negative.   Allergic/Immunologic: Negative.   Neurological: Negative.   Hematological: Negative.   Psychiatric/Behavioral: Negative.        Objective:   Physical Exam  Constitutional: She is oriented to person, place, and time. She appears well-developed and well-nourished. No distress.  The patient is pleasant and alert.  HENT:  Head: Normocephalic and atraumatic.  Eyes: Conjunctivae and EOM are normal. Pupils are equal, round, and reactive to light. Right eye exhibits no discharge. Left eye exhibits no discharge. No scleral icterus.  Neck: Normal range of motion. Neck supple. No thyromegaly present.  No thyromegaly or bruits.  Cardiovascular: Normal rate, regular rhythm and  normal heart sounds.  No murmur heard. Pulmonary/Chest: Effort normal and breath sounds normal. No respiratory distress. She has no wheezes. She has no rales.  Abdominal: Soft. Bowel sounds are normal. She exhibits no mass. There is no tenderness. There is no rebound and no guarding.  Musculoskeletal: Normal range of motion. She exhibits no edema.  Lymphadenopathy:    She has no cervical adenopathy.  Neurological: She is alert and oriented to person, place,  and time.  Skin: Skin is warm and dry. No rash noted.  Psychiatric: She has a normal mood and affect. Her behavior is normal. Judgment and thought content normal.  Nursing note and vitals reviewed.   BP (!) 88/66 (BP Location: Left Arm)   Pulse 73   Temp (!) 97.5 F (36.4 C) (Oral)   Ht 5\' 4"  (1.626 m)   Wt 121 lb (54.9 kg)   BMI 20.77 kg/m        Assessment & Plan:  1. Other acute gastritis without hemorrhage -We will consider this a one-time episode and perfect storm for her stomach after having 3 minutes the day before and hot sausage and pineapple juice the morning of the event.  She should continue with the Zantac twice daily before breakfast and supper at least through next weekend.  After next week and only take Zantac twice a day as needed.  She is reassured.  She should continue to avoid caffeine as much as possible.  She should also leave off the mints.  Patient Instructions  Avoid MINT and spicy foods Take Zantac daily through next weekend and then begin to use as needed. After next weekend take a ranitidine or Zantac twice a day before breakfast and supper only if needed.   Arrie Senate MD

## 2017-11-05 NOTE — Patient Instructions (Addendum)
Avoid MINT and spicy foods Take Zantac daily through next weekend and then begin to use as needed. After next weekend take a ranitidine or Zantac twice a day before breakfast and supper only if needed.

## 2017-11-12 ENCOUNTER — Other Ambulatory Visit: Payer: Self-pay | Admitting: Family Medicine

## 2017-11-12 NOTE — Telephone Encounter (Signed)
Please call in alprazolam with 1 refills 

## 2017-11-12 NOTE — Telephone Encounter (Signed)
Rx called in to pharmacy. 

## 2017-11-14 ENCOUNTER — Ambulatory Visit (INDEPENDENT_AMBULATORY_CARE_PROVIDER_SITE_OTHER): Payer: Medicare Other | Admitting: *Deleted

## 2017-11-14 DIAGNOSIS — R5383 Other fatigue: Secondary | ICD-10-CM

## 2017-11-14 DIAGNOSIS — E538 Deficiency of other specified B group vitamins: Secondary | ICD-10-CM

## 2017-11-14 NOTE — Progress Notes (Signed)
Pt given Cyanocobalamin inj Tolerated well 

## 2017-12-14 ENCOUNTER — Ambulatory Visit: Payer: Medicare Other

## 2017-12-19 ENCOUNTER — Ambulatory Visit: Payer: Medicare Other

## 2017-12-20 ENCOUNTER — Ambulatory Visit (INDEPENDENT_AMBULATORY_CARE_PROVIDER_SITE_OTHER): Payer: Medicare Other | Admitting: *Deleted

## 2017-12-20 DIAGNOSIS — R5383 Other fatigue: Secondary | ICD-10-CM | POA: Diagnosis not present

## 2017-12-20 DIAGNOSIS — E538 Deficiency of other specified B group vitamins: Secondary | ICD-10-CM

## 2017-12-20 NOTE — Progress Notes (Signed)
Pt given Cyanocobalamin inj Tolerated well 

## 2017-12-27 DIAGNOSIS — L218 Other seborrheic dermatitis: Secondary | ICD-10-CM | POA: Diagnosis not present

## 2017-12-27 DIAGNOSIS — L603 Nail dystrophy: Secondary | ICD-10-CM | POA: Diagnosis not present

## 2017-12-27 DIAGNOSIS — L812 Freckles: Secondary | ICD-10-CM | POA: Diagnosis not present

## 2017-12-27 DIAGNOSIS — L720 Epidermal cyst: Secondary | ICD-10-CM | POA: Diagnosis not present

## 2017-12-27 DIAGNOSIS — L821 Other seborrheic keratosis: Secondary | ICD-10-CM | POA: Diagnosis not present

## 2017-12-27 DIAGNOSIS — D225 Melanocytic nevi of trunk: Secondary | ICD-10-CM | POA: Diagnosis not present

## 2017-12-27 DIAGNOSIS — I788 Other diseases of capillaries: Secondary | ICD-10-CM | POA: Diagnosis not present

## 2017-12-27 DIAGNOSIS — L570 Actinic keratosis: Secondary | ICD-10-CM | POA: Diagnosis not present

## 2017-12-28 DIAGNOSIS — H04121 Dry eye syndrome of right lacrimal gland: Secondary | ICD-10-CM | POA: Diagnosis not present

## 2018-01-22 ENCOUNTER — Ambulatory Visit (INDEPENDENT_AMBULATORY_CARE_PROVIDER_SITE_OTHER): Payer: Medicare Other | Admitting: *Deleted

## 2018-01-22 DIAGNOSIS — E538 Deficiency of other specified B group vitamins: Secondary | ICD-10-CM

## 2018-01-22 DIAGNOSIS — R5383 Other fatigue: Secondary | ICD-10-CM | POA: Diagnosis not present

## 2018-01-23 NOTE — Progress Notes (Signed)
Pt given Cyanocobalamin inj Tolerated well 

## 2018-01-28 ENCOUNTER — Telehealth: Payer: Self-pay

## 2018-01-28 NOTE — Telephone Encounter (Signed)
Please inform patient to not use the Mucinex DM if it causes side effects. We will see what happens this spring.

## 2018-01-28 NOTE — Telephone Encounter (Signed)
Spoke with pt. Pt states she has stopped the Mucinex-D as it has caused her stomach to hurt. Pt states she has had no nausea, vommitting or diarrhea with taking medication. Pt states she currently does not have any symptoms and is not in need of Mucinex-D. Pt states her worst time is the end of March. Advised Pt that message would be sent to Dr. Neldon Mc to advise.

## 2018-01-28 NOTE — Telephone Encounter (Signed)
Patient has some concerns with her Mucinex-D. She wants to talk to Dr Rose Martinez about this medication. Patient wouldn't go into detail with me.   Please Advise

## 2018-01-28 NOTE — Telephone Encounter (Signed)
Noted  

## 2018-02-19 ENCOUNTER — Ambulatory Visit (INDEPENDENT_AMBULATORY_CARE_PROVIDER_SITE_OTHER): Payer: Medicare Other | Admitting: *Deleted

## 2018-02-19 DIAGNOSIS — R5383 Other fatigue: Secondary | ICD-10-CM

## 2018-02-19 DIAGNOSIS — E538 Deficiency of other specified B group vitamins: Secondary | ICD-10-CM

## 2018-02-19 NOTE — Progress Notes (Signed)
Pt given Cyanocobalamin inj Tolerated well 

## 2018-03-06 ENCOUNTER — Ambulatory Visit: Payer: Medicare Other

## 2018-03-06 ENCOUNTER — Ambulatory Visit (INDEPENDENT_AMBULATORY_CARE_PROVIDER_SITE_OTHER): Payer: Medicare Other | Admitting: Family Medicine

## 2018-03-06 ENCOUNTER — Ambulatory Visit (INDEPENDENT_AMBULATORY_CARE_PROVIDER_SITE_OTHER): Payer: Medicare Other

## 2018-03-06 ENCOUNTER — Other Ambulatory Visit: Payer: Self-pay

## 2018-03-06 ENCOUNTER — Encounter: Payer: Self-pay | Admitting: Family Medicine

## 2018-03-06 VITALS — BP 110/66 | HR 58 | Temp 96.4°F | Ht 64.0 in | Wt 121.0 lb

## 2018-03-06 DIAGNOSIS — E559 Vitamin D deficiency, unspecified: Secondary | ICD-10-CM

## 2018-03-06 DIAGNOSIS — M79671 Pain in right foot: Secondary | ICD-10-CM

## 2018-03-06 DIAGNOSIS — Z299 Encounter for prophylactic measures, unspecified: Secondary | ICD-10-CM | POA: Diagnosis not present

## 2018-03-06 DIAGNOSIS — M79672 Pain in left foot: Principal | ICD-10-CM

## 2018-03-06 DIAGNOSIS — F419 Anxiety disorder, unspecified: Secondary | ICD-10-CM

## 2018-03-06 DIAGNOSIS — E7800 Pure hypercholesterolemia, unspecified: Secondary | ICD-10-CM | POA: Diagnosis not present

## 2018-03-06 DIAGNOSIS — J301 Allergic rhinitis due to pollen: Secondary | ICD-10-CM

## 2018-03-06 DIAGNOSIS — D696 Thrombocytopenia, unspecified: Secondary | ICD-10-CM

## 2018-03-06 DIAGNOSIS — E538 Deficiency of other specified B group vitamins: Secondary | ICD-10-CM

## 2018-03-06 DIAGNOSIS — K219 Gastro-esophageal reflux disease without esophagitis: Secondary | ICD-10-CM | POA: Diagnosis not present

## 2018-03-06 DIAGNOSIS — M19071 Primary osteoarthritis, right ankle and foot: Secondary | ICD-10-CM | POA: Diagnosis not present

## 2018-03-06 DIAGNOSIS — I959 Hypotension, unspecified: Secondary | ICD-10-CM

## 2018-03-06 DIAGNOSIS — M7732 Calcaneal spur, left foot: Secondary | ICD-10-CM | POA: Diagnosis not present

## 2018-03-06 DIAGNOSIS — R918 Other nonspecific abnormal finding of lung field: Secondary | ICD-10-CM | POA: Diagnosis not present

## 2018-03-06 NOTE — Progress Notes (Signed)
Subjective:    Patient ID: Rose Martinez, female    DOB: Jul 28, 1935, 83 y.o.   MRN: [id number]  HPI Patient presents today for a follow up of chronic medical problems such as hypotension, osteoporosis, vitamin D deficiency, hyperlipidemia, and also would like to have her toes looked at due to some swelling.  The patient is doing well overall.  She is having some problems with she is due to get a DEXA scan but refused to do this.  She is due to get a chest x-ray and will be given an FOBT to return.  She will come back for fasting lab work.  The patient has a history of hyperlipidemia osteoporosis vitamin D deficiency thrombocytopenia and recurrent urinary tract infections.  Patient has been taking B12 injections monthly and got her last B12 injection a couple weeks ago.  We will also add a B12 level to her current lab work that is to be drawn.  She denies any chest pain pressure or tightness or shortness of breath.  She has stopped eating Mints and no longer has any reflux or epigastric pain.  There is been no change in bowel habits and everything seems to be working well with her intestinal tract.  She also denies any trouble with passing her water.  She does continue to follow-up with a cardiologist periodically and also her ear nose and throat specialist.  Patient Active Problem List   Diagnosis Date Noted  . Abnormal laboratory test result 05/25/2016  . Thrombocytopenia ([place]) 07/16/2015  . Adjustment disorder with mixed anxiety and depressed mood 06/03/2015  . Vitamin D deficiency 06/03/2015  . Hyperlipidemia 06/03/2015  . Osteoporosis 01/20/2015  . Recurrent urinary tract infection 09/03/2013  . Chest pain 08/17/2011  . Preventive measure 08/16/2011  . HYPOTENSION, UNSPECIFIED 07/13/2010  . MITRAL REGURGITATION 02/02/2010  . PALPITATIONS 02/02/2010   Outpatient Encounter Medications as of 03/06/2018  Medication Sig  . ALPRAZolam (XANAX) 0.25 MG tablet TAKE (1/2) TABLET THREE TIMES  DAILY.  . cholecalciferol (VITAMIN D) 1000 UNITS tablet Take 2,000 Units by mouth daily.   Marland Kitchen loratadine (CLARITIN) 10 MG tablet Take 10 mg 2 (two) times daily by mouth.  . montelukast (SINGULAIR) 10 MG tablet   . NASAL SALINE NA Place into the nose.  . Probiotic Product (ALIGN PO) Take 1 capsule by mouth daily.  . propranolol (INDERAL) 20 MG tablet TAKE (1) TABLET DAILY AS DIRECTED.  . [DISCONTINUED] aspirin 81 MG tablet Take 81 mg by mouth daily.  . [DISCONTINUED] omeprazole (PRILOSEC) 40 MG capsule Take 1 capsule (40 mg total) daily by mouth. (Patient not taking: Reported on 11/05/2017)  . [DISCONTINUED] pseudoephedrine-guaifenesin (MUCINEX D) 60-600 MG 12 hr tablet Take 1 tablet by mouth 2 (two) times daily.   Facility-Administered Encounter Medications as of 03/06/2018  Medication  . cyanocobalamin ((VITAMIN B-12)) injection 1,000 mcg       Review of Systems  Constitutional: Negative for activity change, appetite change, chills, diaphoresis, fatigue, fever and unexpected weight change.  HENT: Negative for congestion, dental problem, drooling, ear discharge, ear pain, facial swelling, hearing loss, mouth sores, nosebleeds, postnasal drip, rhinorrhea, sinus pressure, sinus pain, sneezing, sore throat, tinnitus, trouble swallowing and voice change.   Eyes: Negative for photophobia, pain, discharge, redness, itching and visual disturbance.  Respiratory: Negative for apnea ( ), cough, choking, chest tightness, shortness of breath, wheezing and stridor.   Cardiovascular: Negative for chest pain, palpitations and leg swelling.  Gastrointestinal: Negative for abdominal distention, abdominal pain,  anal bleeding, blood in stool, constipation, diarrhea, nausea, rectal pain and vomiting.  Endocrine: Negative for cold intolerance, heat intolerance, polydipsia, polyphagia and polyuria.  Genitourinary: Negative for decreased urine volume, difficulty urinating, dyspareunia, dysuria, enuresis, flank  pain, frequency, genital sores, hematuria, menstrual problem, pelvic pain, urgency, vaginal bleeding, vaginal discharge and vaginal pain.  Musculoskeletal: Negative for arthralgias, back pain, gait problem, joint swelling, myalgias, neck pain and neck stiffness.  Skin: Negative for color change, pallor, rash and wound.  Allergic/Immunologic: Negative for environmental allergies, food allergies and immunocompromised state.  Neurological: Positive for light-headedness. Negative for dizziness, tremors, seizures, syncope, facial asymmetry, speech difficulty, weakness, numbness and headaches.  Hematological: Negative for adenopathy. Does not bruise/bleed easily.  Psychiatric/Behavioral: Negative for agitation, behavioral problems, confusion, decreased concentration, dysphoric mood, hallucinations, self-injury, sleep disturbance and suicidal ideas. The patient is not nervous/anxious and is not hyperactive.        Objective:   Physical Exam  Constitutional: She is oriented to person, place, and time. She appears well-developed and well-nourished. No distress.  The patient is pleasant and relaxed and still misses her close friend that died couple years ago.  HENT:  Head: Normocephalic and atraumatic.  Right Ear: External ear normal.  Left Ear: External ear normal.  Nose: Nose normal.  Mouth/Throat: Oropharynx is clear and moist.  Eyes: Conjunctivae and EOM are normal. Pupils are equal, round, and reactive to light. Right eye exhibits no discharge. Left eye exhibits no discharge. No scleral icterus.  Neck: Normal range of motion. Neck supple. No thyromegaly present.  Cardiovascular: Normal rate, regular rhythm, normal heart sounds and intact distal pulses.  No murmur heard. The heart is regular at 60/min  Pulmonary/Chest: Effort normal and breath sounds normal. No respiratory distress. She has no wheezes. She has no rales.  Clear anteriorly and posteriorly  Abdominal: Soft. Bowel sounds are normal.  She exhibits no mass. There is no tenderness. There is no rebound and no guarding.  No abdominal tenderness masses organ enlargement or inguinal adenopathy or bruits.  Musculoskeletal: Normal range of motion. She exhibits no edema, tenderness or deformity.  Good pulses both feet with no rubor or swelling or redness  Lymphadenopathy:    She has no cervical adenopathy.  Neurological: She is alert and oriented to person, place, and time. She has normal reflexes. No cranial nerve deficit.  Skin: Skin is warm and dry. No rash noted.  Psychiatric: She has a normal mood and affect. Her behavior is normal. Judgment and thought content normal.  Nursing note and vitals reviewed.  BP 110/66   Pulse (!) 58   Temp (!) 96.4 F (35.8 C) (Oral)   Ht 5\' 4"  (1.626 m)   Wt 121 lb (54.9 kg)   BMI 20.77 kg/m   Chest x-ray and x-rays of both feet with results pending==      Assessment & Plan:  1. Anxiety disorder, unspecified type -Continue with current treatment  2. Vitamin D deficiency -Continue with vitamin D replacement +600 mg of calcium daily to protect bones  3. Thrombocytopenia ([facility]) -No bleeding complaints or issues noted in history today.  4. Pure hypercholesterolemia -Continue as aggressive therapeutic lifestyle changes as possible  5. Low vitamin B12 level -The patient has felt much better on B12 injections.  She had her last one about 2 weeks ago and we will get a B12 level done she comes in for her blood draw tomorrow.  6. Gastroesophageal reflux disease without esophagitis -This problem has resolved completely since she  has stopped eating Mints  7. Bilateral foot pain -Probably arthritic related may have something to do with some mild fluid retention.  8.  Allergic rhinitis -Continue with treatments as recommended by Dr. Neldon Mc  Patient Instructions  Continue to follow-up with ear nose and throat specialist as currently doing Continue to follow-up with cardiology We will  call with lab work results and chest x-ray results as soon as these become available. Citracal 600 mg once daily Please put on support hose the first thing upon getting out of the bed in the morning Reduce sodium intake to reduce fluid retention which should help feet feel better  Arrie Senate MD

## 2018-03-06 NOTE — Patient Instructions (Addendum)
Continue to follow-up with ear nose and throat specialist as currently doing Continue to follow-up with cardiology We will call with lab work results and chest x-ray results as soon as these become available. Citracal 600 mg once daily Please put on support hose the first thing upon getting out of the bed in the morning Reduce sodium intake to reduce fluid retention which should help feet feel better

## 2018-03-06 NOTE — Addendum Note (Signed)
Addended by: Rolena Infante on: 03/06/2018 04:22 PM   Modules accepted: Orders

## 2018-03-07 ENCOUNTER — Other Ambulatory Visit: Payer: Medicare Other

## 2018-03-07 DIAGNOSIS — D696 Thrombocytopenia, unspecified: Secondary | ICD-10-CM

## 2018-03-07 DIAGNOSIS — E7800 Pure hypercholesterolemia, unspecified: Secondary | ICD-10-CM | POA: Diagnosis not present

## 2018-03-07 DIAGNOSIS — I959 Hypotension, unspecified: Secondary | ICD-10-CM

## 2018-03-07 DIAGNOSIS — E538 Deficiency of other specified B group vitamins: Secondary | ICD-10-CM | POA: Diagnosis not present

## 2018-03-08 LAB — CBC WITH DIFFERENTIAL/PLATELET
Basophils Absolute: 0 10*3/uL (ref 0.0–0.2)
Basos: 1 %
EOS (ABSOLUTE): 0.1 10*3/uL (ref 0.0–0.4)
Eos: 2 %
Hematocrit: 40.6 % (ref 34.0–46.6)
Hemoglobin: 13.2 g/dL (ref 11.1–15.9)
IMMATURE GRANS (ABS): 0 10*3/uL (ref 0.0–0.1)
IMMATURE GRANULOCYTES: 0 %
LYMPHS: 38 %
Lymphocytes Absolute: 1.6 10*3/uL (ref 0.7–3.1)
MCH: 32.5 pg (ref 26.6–33.0)
MCHC: 32.5 g/dL (ref 31.5–35.7)
MCV: 100 fL — ABNORMAL HIGH (ref 79–97)
Monocytes Absolute: 0.3 10*3/uL (ref 0.1–0.9)
Monocytes: 8 %
NEUTROS PCT: 51 %
Neutrophils Absolute: 2.1 10*3/uL (ref 1.4–7.0)
PLATELETS: 181 10*3/uL (ref 150–379)
RBC: 4.06 x10E6/uL (ref 3.77–5.28)
RDW: 12.6 % (ref 12.3–15.4)
WBC: 4.1 10*3/uL (ref 3.4–10.8)

## 2018-03-08 LAB — HEPATIC FUNCTION PANEL
ALT: 11 IU/L (ref 0–32)
AST: 14 IU/L (ref 0–40)
Albumin: 4.2 g/dL (ref 3.5–4.7)
Alkaline Phosphatase: 89 IU/L (ref 39–117)
BILIRUBIN TOTAL: 0.5 mg/dL (ref 0.0–1.2)
BILIRUBIN, DIRECT: 0.11 mg/dL (ref 0.00–0.40)
Total Protein: 6.5 g/dL (ref 6.0–8.5)

## 2018-03-08 LAB — LIPID PANEL
Chol/HDL Ratio: 3.3 ratio (ref 0.0–4.4)
Cholesterol, Total: 227 mg/dL — ABNORMAL HIGH (ref 100–199)
HDL: 68 mg/dL (ref >39)
LDL Calculated: 143 mg/dL — ABNORMAL HIGH (ref 0–99)
Triglycerides: 78 mg/dL (ref 0–149)
VLDL CHOLESTEROL CAL: 16 mg/dL (ref 5–40)

## 2018-03-08 LAB — BMP8+EGFR
BUN/Creatinine Ratio: 21 (ref 12–28)
BUN: 13 mg/dL (ref 8–27)
CALCIUM: 9.3 mg/dL (ref 8.7–10.3)
CHLORIDE: 106 mmol/L (ref 96–106)
CO2: 25 mmol/L (ref 20–29)
Creatinine, Ser: 0.63 mg/dL (ref 0.57–1.00)
GFR calc Af Amer: 98 mL/min/{1.73_m2} (ref >59)
GFR, EST NON AFRICAN AMERICAN: 85 mL/min/{1.73_m2} (ref >59)
Glucose: 80 mg/dL (ref 65–99)
POTASSIUM: 5 mmol/L (ref 3.5–5.2)
Sodium: 146 mmol/L — ABNORMAL HIGH (ref 134–144)

## 2018-03-08 LAB — VITAMIN B12: VITAMIN B 12: 652 pg/mL (ref 232–1245)

## 2018-03-14 ENCOUNTER — Telehealth: Payer: Self-pay | Admitting: Family Medicine

## 2018-03-14 NOTE — Telephone Encounter (Signed)
CALLING PATIENT TO SET UP AWV.

## 2018-03-22 ENCOUNTER — Other Ambulatory Visit: Payer: Self-pay | Admitting: Allergy and Immunology

## 2018-03-22 ENCOUNTER — Ambulatory Visit (INDEPENDENT_AMBULATORY_CARE_PROVIDER_SITE_OTHER): Payer: Medicare Other | Admitting: *Deleted

## 2018-03-22 DIAGNOSIS — R5383 Other fatigue: Secondary | ICD-10-CM | POA: Diagnosis not present

## 2018-03-22 DIAGNOSIS — E538 Deficiency of other specified B group vitamins: Secondary | ICD-10-CM

## 2018-03-22 NOTE — Progress Notes (Signed)
Pt given Cyanocobalamin inj Tolerated well 

## 2018-03-25 NOTE — Progress Notes (Signed)
    HPI The patient presents for followup of palpitations.  She reports that her feet have been bothering her like she is "walking on pillows."  She has shooting pain in her big toes at times and some throbbing.  She has sinus problems particularly with the pollen.  She reports that at times her legs are weak and she can "feel her muscles" when she walks up the stairs.  At other times she can "shoot right up like a monkey."  The feels week in the morning until she has a Coke (sometimes she prefers tea.)  The patient denies any new symptoms such as chest discomfort, neck or arm discomfort. There has been no new shortness of breath, PND or orthopnea. There have been no reported palpitations, presyncope or syncope.  She has not needed her PRN beta blocker.    Allergies  Allergen Reactions  . Statins     myalgias  . Cephalexin Other (See Comments)    Pt does not remember   . Penicillins Other (See Comments)    Pt's mother was severly allergic, so she has never taken PCN.    Current Outpatient Medications  Medication Sig Dispense Refill  . ALPRAZolam (XANAX) 0.25 MG tablet 0.25 mg. Take 1/2 tablet three times daily as needed    . cholecalciferol (VITAMIN D) 1000 UNITS tablet Take 2,000 Units by mouth daily.     Marland Kitchen loratadine (CLARITIN) 10 MG tablet Take 10 mg 2 (two) times daily by mouth.    . montelukast (SINGULAIR) 10 MG tablet Take one tablet once daily as directed 30 tablet 5  . NASAL SALINE NA Place into the nose.    . propranolol (INDERAL) 20 MG tablet Take 20 mg by mouth daily as needed.    . Probiotic Product (ALIGN PO) Take 1 capsule by mouth daily.     No current facility-administered medications for this visit.     Past Medical History:  Diagnosis Date  . Anxiety   . Hyperlipidemia    Mild  . Osteoporosis   . Palpitations     Past Surgical History:  Procedure Laterality Date  . CYSTECTOMY     Breast  . EYE SURGERY    . OOPHORECTOMY      ROS:    As stated in the HPI  and negative for all other systems.   PHYSICAL EXAM BP 100/66   Pulse 60   Ht 5\' 5"  (1.651 m)   Wt 122 lb (55.3 kg)   BMI 20.30 kg/m   GENERAL:  Well appearing NECK:  No jugular venous distention, waveform within normal limits, carotid upstroke brisk and symmetric, no bruits, no thyromegaly LUNGS:  Clear to auscultation bilaterally CHEST:  Unremarkable HEART:  PMI not displaced or sustained,S1 and S2 within normal limits, no S3, no S4, no clicks, no rubs, no murmurs ABD:  Flat, positive bowel sounds normal in frequency in pitch, no bruits, no rebound, no guarding, no midline pulsatile mass, no hepatomegaly, no splenomegaly EXT:  2 plus pulses throughout, no edema, no cyanosis no clubbing   EKG:  NA  ASSESSMENT AND PLAN  PALPITATIONS:  She is not experiencing these.    No change in therapy.   DIZZINESS:  She is not complaining of this.  No change in therapy.   FOOT PROBLEMS:  She would like to have a magnesium level and I will order this.

## 2018-03-27 ENCOUNTER — Other Ambulatory Visit: Payer: Medicare Other

## 2018-03-27 ENCOUNTER — Encounter: Payer: Self-pay | Admitting: Cardiology

## 2018-03-27 ENCOUNTER — Ambulatory Visit (INDEPENDENT_AMBULATORY_CARE_PROVIDER_SITE_OTHER): Payer: Medicare Other | Admitting: Cardiology

## 2018-03-27 VITALS — BP 100/66 | HR 60 | Ht 65.0 in | Wt 122.0 lb

## 2018-03-27 DIAGNOSIS — R002 Palpitations: Secondary | ICD-10-CM

## 2018-03-27 NOTE — Patient Instructions (Signed)
Medication Instructions:  The current medical regimen is effective;  continue present plan and medications.  Lab: Please have blood work today at [facility]  (Magnesium)  Follow-Up: Follow up in 6 months with Dr. Percival Spanish.  You will receive a letter in the mail 2 months before you are due.  Please call us when you receive this letter to schedule your follow up appointment.  If you need a refill on your cardiac medications before your next appointment, please call your pharmacy.  Thank you for choosing [facility]!!

## 2018-03-28 LAB — MAGNESIUM: Magnesium: 2.4 mg/dL — ABNORMAL HIGH (ref 1.6–2.3)

## 2018-04-06 ENCOUNTER — Other Ambulatory Visit: Payer: Self-pay | Admitting: Family Medicine

## 2018-04-19 ENCOUNTER — Ambulatory Visit: Payer: Medicare Other

## 2018-04-22 DIAGNOSIS — H6122 Impacted cerumen, left ear: Secondary | ICD-10-CM | POA: Diagnosis not present

## 2018-04-22 DIAGNOSIS — H6062 Unspecified chronic otitis externa, left ear: Secondary | ICD-10-CM | POA: Diagnosis not present

## 2018-04-24 ENCOUNTER — Ambulatory Visit (INDEPENDENT_AMBULATORY_CARE_PROVIDER_SITE_OTHER): Payer: Medicare Other | Admitting: *Deleted

## 2018-04-24 DIAGNOSIS — E538 Deficiency of other specified B group vitamins: Secondary | ICD-10-CM | POA: Diagnosis not present

## 2018-04-24 MED ORDER — CYANOCOBALAMIN 1000 MCG/ML IJ SOLN
1000.0000 ug | INTRAMUSCULAR | Status: AC
  Administered 2018-04-24 – 2019-02-20 (×14): 1000 ug via INTRAMUSCULAR

## 2018-04-24 NOTE — Progress Notes (Signed)
Pt given Cyanocobalamin inj Tolerated well 

## 2018-04-26 DIAGNOSIS — H6062 Unspecified chronic otitis externa, left ear: Secondary | ICD-10-CM | POA: Diagnosis not present

## 2018-05-09 DIAGNOSIS — H6522 Chronic serous otitis media, left ear: Secondary | ICD-10-CM | POA: Diagnosis not present

## 2018-05-09 DIAGNOSIS — J301 Allergic rhinitis due to pollen: Secondary | ICD-10-CM | POA: Diagnosis not present

## 2018-05-17 DIAGNOSIS — H6062 Unspecified chronic otitis externa, left ear: Secondary | ICD-10-CM | POA: Diagnosis not present

## 2018-05-17 DIAGNOSIS — H6122 Impacted cerumen, left ear: Secondary | ICD-10-CM | POA: Diagnosis not present

## 2018-05-20 ENCOUNTER — Telehealth: Payer: Self-pay | Admitting: Allergy and Immunology

## 2018-05-20 ENCOUNTER — Other Ambulatory Visit: Payer: Self-pay | Admitting: *Deleted

## 2018-05-20 DIAGNOSIS — E538 Deficiency of other specified B group vitamins: Secondary | ICD-10-CM

## 2018-05-20 NOTE — Progress Notes (Signed)
Pt states that her energy levels is dropping after 2 weeks after the b12 shots   We will get a level this week prior to the 6/7 b12 shot. If low - we will start one a week for 4 weeks the go back to monthly. Order placed.  She also wanted to know of other ENT - [facility] and Janace Hoard

## 2018-05-20 NOTE — Telephone Encounter (Signed)
Patient wants to speak with DR Neldon Mc about medications that were prescribed for her (( would not go into detail ))

## 2018-05-21 ENCOUNTER — Other Ambulatory Visit: Payer: Self-pay | Admitting: *Deleted

## 2018-05-21 ENCOUNTER — Other Ambulatory Visit: Payer: Medicare Other

## 2018-05-21 DIAGNOSIS — H938X2 Other specified disorders of left ear: Secondary | ICD-10-CM

## 2018-05-21 DIAGNOSIS — E538 Deficiency of other specified B group vitamins: Secondary | ICD-10-CM | POA: Diagnosis not present

## 2018-05-22 DIAGNOSIS — H6062 Unspecified chronic otitis externa, left ear: Secondary | ICD-10-CM | POA: Diagnosis not present

## 2018-05-22 LAB — VITAMIN B12: Vitamin B-12: 730 pg/mL (ref 232–1245)

## 2018-05-24 ENCOUNTER — Ambulatory Visit (INDEPENDENT_AMBULATORY_CARE_PROVIDER_SITE_OTHER): Payer: Medicare Other | Admitting: *Deleted

## 2018-05-24 DIAGNOSIS — E538 Deficiency of other specified B group vitamins: Secondary | ICD-10-CM

## 2018-05-24 NOTE — Progress Notes (Signed)
Pt given Cyanocobalamin inj Tolerated well 

## 2018-05-31 ENCOUNTER — Ambulatory Visit (INDEPENDENT_AMBULATORY_CARE_PROVIDER_SITE_OTHER): Payer: Medicare Other | Admitting: *Deleted

## 2018-05-31 DIAGNOSIS — E538 Deficiency of other specified B group vitamins: Secondary | ICD-10-CM

## 2018-05-31 NOTE — Progress Notes (Signed)
Pt given cyanocobalamin inj Tolerated well 

## 2018-06-07 ENCOUNTER — Ambulatory Visit (INDEPENDENT_AMBULATORY_CARE_PROVIDER_SITE_OTHER): Payer: Medicare Other

## 2018-06-07 DIAGNOSIS — E538 Deficiency of other specified B group vitamins: Secondary | ICD-10-CM

## 2018-06-13 DIAGNOSIS — H6062 Unspecified chronic otitis externa, left ear: Secondary | ICD-10-CM | POA: Diagnosis not present

## 2018-06-14 ENCOUNTER — Ambulatory Visit (INDEPENDENT_AMBULATORY_CARE_PROVIDER_SITE_OTHER): Payer: Medicare Other | Admitting: *Deleted

## 2018-06-14 DIAGNOSIS — E538 Deficiency of other specified B group vitamins: Secondary | ICD-10-CM

## 2018-06-14 NOTE — Progress Notes (Signed)
Pt given Cyanocobalamin inj Tolerated well 

## 2018-07-12 ENCOUNTER — Ambulatory Visit (INDEPENDENT_AMBULATORY_CARE_PROVIDER_SITE_OTHER): Payer: Medicare Other | Admitting: *Deleted

## 2018-07-12 DIAGNOSIS — E538 Deficiency of other specified B group vitamins: Secondary | ICD-10-CM

## 2018-07-12 NOTE — Progress Notes (Signed)
Pt given Cyanocobalamin inj Tolerated well 

## 2018-07-19 DIAGNOSIS — D225 Melanocytic nevi of trunk: Secondary | ICD-10-CM | POA: Diagnosis not present

## 2018-07-19 DIAGNOSIS — L720 Epidermal cyst: Secondary | ICD-10-CM | POA: Diagnosis not present

## 2018-07-19 DIAGNOSIS — L812 Freckles: Secondary | ICD-10-CM | POA: Diagnosis not present

## 2018-07-19 DIAGNOSIS — L821 Other seborrheic keratosis: Secondary | ICD-10-CM | POA: Diagnosis not present

## 2018-07-22 DIAGNOSIS — Z961 Presence of intraocular lens: Secondary | ICD-10-CM | POA: Diagnosis not present

## 2018-07-22 DIAGNOSIS — H04121 Dry eye syndrome of right lacrimal gland: Secondary | ICD-10-CM | POA: Diagnosis not present

## 2018-07-24 DIAGNOSIS — H6062 Unspecified chronic otitis externa, left ear: Secondary | ICD-10-CM | POA: Diagnosis not present

## 2018-08-09 ENCOUNTER — Ambulatory Visit (INDEPENDENT_AMBULATORY_CARE_PROVIDER_SITE_OTHER): Payer: Medicare Other | Admitting: *Deleted

## 2018-08-09 DIAGNOSIS — E538 Deficiency of other specified B group vitamins: Secondary | ICD-10-CM | POA: Diagnosis not present

## 2018-08-09 NOTE — Progress Notes (Signed)
Pt given Cyanocobalamin inj Tolerated well 

## 2018-08-10 ENCOUNTER — Other Ambulatory Visit: Payer: Self-pay | Admitting: Family Medicine

## 2018-08-12 NOTE — Telephone Encounter (Signed)
Last seen 03/06/18

## 2018-08-21 ENCOUNTER — Encounter: Payer: Self-pay | Admitting: Family Medicine

## 2018-08-21 ENCOUNTER — Ambulatory Visit (INDEPENDENT_AMBULATORY_CARE_PROVIDER_SITE_OTHER): Payer: Medicare Other | Admitting: Family Medicine

## 2018-08-21 ENCOUNTER — Telehealth: Payer: Self-pay | Admitting: Family Medicine

## 2018-08-21 VITALS — BP 100/57 | HR 61 | Temp 97.1°F | Ht 65.0 in | Wt 120.0 lb

## 2018-08-21 DIAGNOSIS — E559 Vitamin D deficiency, unspecified: Secondary | ICD-10-CM | POA: Diagnosis not present

## 2018-08-21 DIAGNOSIS — D696 Thrombocytopenia, unspecified: Secondary | ICD-10-CM | POA: Diagnosis not present

## 2018-08-21 DIAGNOSIS — E538 Deficiency of other specified B group vitamins: Secondary | ICD-10-CM | POA: Diagnosis not present

## 2018-08-21 DIAGNOSIS — E7800 Pure hypercholesterolemia, unspecified: Secondary | ICD-10-CM

## 2018-08-21 DIAGNOSIS — K219 Gastro-esophageal reflux disease without esophagitis: Secondary | ICD-10-CM | POA: Diagnosis not present

## 2018-08-21 DIAGNOSIS — R531 Weakness: Secondary | ICD-10-CM

## 2018-08-21 NOTE — Telephone Encounter (Signed)
Please call.

## 2018-08-21 NOTE — Telephone Encounter (Signed)
Feels better and stronger after lunch = just FYI. Just forgot to mention it today .

## 2018-08-21 NOTE — Progress Notes (Signed)
Subjective:    Patient ID: Rose Martinez, female    DOB: 21-Sep-1935, 83 y.o.   MRN: [id number]  HPI Pt here for follow up and management of chronic medical problems which includes hyperlipidemia. She is taking medication regularly.  The patient today complains of some generalized weakness.  She was due to get a mammogram and a DEXA scan but refuses to do either 1 of these.  She was given an FOBT to return and she will get lab work today.  She does complain today of some generalized weakness.  Her vital signs are stable with a blood pressure running around 100/57.  She says today she is willing to try one half of the cholesterol pill if needed.  We will await until lab work is returned to decide what to do about that.  She still continues to see the cardiologist that comes to [facility] periodically.  She is also having problems with ear nose and throat especially ear issues and she is seeing Dr. Vicie Mutters.  I do not see anything medication wise that could be causing her weakness.  The patient is doing well overall other than the general weakness which comes and goes.  She does wear support hose but only puts these on before going out for a walk.  She gets her eyes checked regularly.  Today she denies any chest pain pressure tightness or shortness of breath.  She is past due seeing the cardiologist and we will call and make sure that she gets an appointment soon with him.  She denies any trouble with her stomach including nausea vomiting diarrhea blood in the stool black tarry bowel movements or change in bowel habits.  She denies any problems with swallowing heartburn or indigestion.  She has never had a colonoscopy and we did discuss getting a Cologuard and she is willing to do that.  She is passing her water without problems.    Patient Active Problem List   Diagnosis Date Noted  . Abnormal laboratory test result 05/25/2016  . Thrombocytopenia ([place]) 07/16/2015  . Adjustment disorder with mixed  anxiety and depressed mood 06/03/2015  . Vitamin D deficiency 06/03/2015  . Hyperlipidemia 06/03/2015  . Osteoporosis 01/20/2015  . Recurrent urinary tract infection 09/03/2013  . Chest pain 08/17/2011  . Preventive measure 08/16/2011  . HYPOTENSION, UNSPECIFIED 07/13/2010  . MITRAL REGURGITATION 02/02/2010  . PALPITATIONS 02/02/2010   Outpatient Encounter Medications as of 08/21/2018  Medication Sig  . ALPRAZolam (XANAX) 0.25 MG tablet TAKE (1/2) TABLET THREE TIMES DAILY.  . cholecalciferol (VITAMIN D) 1000 UNITS tablet Take 2,000 Units by mouth daily.   Marland Kitchen loratadine (CLARITIN) 10 MG tablet Take 10 mg 2 (two) times daily by mouth.  . montelukast (SINGULAIR) 10 MG tablet Take one tablet once daily as directed  . NASAL SALINE NA Place into the nose.  . Probiotic Product (ALIGN PO) Take 1 capsule by mouth daily.  . propranolol (INDERAL) 20 MG tablet TAKE (1) TABLET DAILY AS DIRECTED.  Marland Kitchen pseudoephedrine-guaifenesin (MUCINEX D) 60-600 MG 12 hr tablet Take 1 tablet by mouth daily.  . [DISCONTINUED] propranolol (INDERAL) 20 MG tablet Take 20 mg by mouth daily as needed.   Facility-Administered Encounter Medications as of 08/21/2018  Medication  . cyanocobalamin ((VITAMIN B-12)) injection 1,000 mcg     Review of Systems  Constitutional: Negative.   HENT: Negative.   Eyes: Negative.   Respiratory: Negative.   Cardiovascular: Negative.   Gastrointestinal: Negative.   Endocrine: Negative.  Genitourinary: Negative.   Musculoskeletal: Negative.   Skin: Negative.   Allergic/Immunologic: Negative.   Neurological: Positive for weakness (general weakness  goes and comes).  Hematological: Negative.   Psychiatric/Behavioral: Negative.        Objective:   Physical Exam  Constitutional: She is oriented to person, place, and time. She appears well-developed and well-nourished. No distress.  Patient is pleasant and alert and looks much younger than her stated age of 83.  HENT:  Head:  Normocephalic and atraumatic.  Right Ear: External ear normal.  Left Ear: External ear normal.  Nose: Nose normal.  Mouth/Throat: Oropharynx is clear and moist.  Eyes: Pupils are equal, round, and reactive to light. Conjunctivae and EOM are normal. Right eye exhibits no discharge. Left eye exhibits no discharge. No scleral icterus.  Neck: Normal range of motion. Neck supple. No thyromegaly present.  No thyromegaly or anterior cervical adenopathy or bruits  Cardiovascular: Normal rate, regular rhythm, normal heart sounds and intact distal pulses.  No murmur heard. Heart is regular at 60/min  Pulmonary/Chest: Effort normal and breath sounds normal. She has no wheezes. She has no rales.  Clear anteriorly and posteriorly  Abdominal: Soft. Bowel sounds are normal. She exhibits no mass. There is no tenderness. There is no rebound and no guarding.  No liver or spleen enlargement epigastric or suprapubic tenderness masses with good inguinal pulses and no inguinal adenopathy.  Musculoskeletal: Normal range of motion. She exhibits no edema.  Lower extremity varicosities without edema  Lymphadenopathy:    She has no cervical adenopathy.  Neurological: She is alert and oriented to person, place, and time. She has normal reflexes. No cranial nerve deficit.  Lower extremity reflexes are good and equal bilaterally  Skin: Skin is warm and dry.  No suspicious lesions skin is dry  Psychiatric: She has a normal mood and affect. Her behavior is normal. Judgment and thought content normal.  The patient's mood affect and behavior are normal for her.  Nursing note and vitals reviewed.   BP (!) 100/57 (BP Location: Left Arm)   Pulse 61   Temp (!) 97.1 F (36.2 C) (Oral)   Ht 5' 5"  (1.651 m)   Wt 120 lb (54.4 kg)   BMI 19.97 kg/m        Assessment & Plan:  1. B12 deficiency -Continue with B12 injections and check B12 level just prior to the next injection - CBC with Differential/Platelet - Vitamin  B12; Future  2. Pure hypercholesterolemia -Continue current aggressive therapeutic lifestyle changes - BMP8+EGFR - CBC with Differential/Platelet - Lipid panel  3. Thrombocytopenia ([place]) -No bleeding issues. - CBC with Differential/Platelet  4. Vitamin D deficiency -Continue calcium and vitamin D replacement - CBC with Differential/Platelet - VITAMIN D 25 Hydroxy (Vit-D Deficiency, Fractures)  5. Gastroesophageal reflux disease without esophagitis -No complaints today with reflux.  Patient will continue with probiotic. - CBC with Differential/Platelet - Hepatic function panel  6. Weakness -This may be venous insufficiency.  Patient will start wearing a very good pair of support hose and will put these on daily once she gets out of the bed the first thing in the morning. - Thyroid Panel With TSH  Patient Instructions                       Medicare Annual Wellness Visit  [facility] and the medical providers at [facility] strive to bring you the best medical care.  In doing so we  not only want to address your current medical conditions and concerns but also to detect new conditions early and prevent illness, disease and health-related problems.    Medicare offers a yearly Wellness Visit which allows our clinical staff to assess your need for preventative services including immunizations, lifestyle education, counseling to decrease risk of preventable diseases and screening for fall risk and other medical concerns.    This visit is provided free of charge (no copay) for all Medicare recipients. The clinical pharmacists at [facility] have begun to conduct these Wellness Visits which will also include a thorough review of all your medications.    As you primary medical provider recommend that you make an appointment for your Annual Wellness Visit if you have not done so already this year.  You may set up this appointment before you leave  today or you may call back ([phone]) and schedule an appointment.  Please make sure when you call that you mention that you are scheduling your Annual Wellness Visit with the clinical pharmacist so that the appointment may be made for the proper length of time.     Continue current medications. Continue good therapeutic lifestyle changes which include good diet and exercise. Fall precautions discussed with patient. If an FOBT was given today- please return it to our front desk. If you are over 1 years old - you may need Prevnar 90 or the adult Pneumonia vaccine.  **Flu shots are available--- please call and schedule a FLU-CLINIC appointment**  After your visit with [place] today you will receive a survey in the mail or online from [employer] regarding your care with [place]. Please take a moment to fill this out. Your feedback is very important to [place] as you can help [place] better understand your patient needs as well as improve your experience and satisfaction. WE CARE ABOUT YOU!!!   We will asked the patient to come back and get a B12 level just prior to getting her next B12 shot which will be sometime around September 23. She is willing to get a Cologuard test and we will order this for her. She is willing also to get a mammogram as this is important and she will get that done here. She will continue to drink plenty of fluids She will check with the department store and get a good form support pantyhose.  And she will put these on the first thing with arising every morning to see if this makes any difference with her dizziness or weakness. We will make sure they get a thyroid profile CBC and electrolytes as part of her blood work. Check pulses periodically especially when you feel like your heart is too slow and record these.   Continue to watch caffeine in the diet.  Arrie Senate MD

## 2018-08-21 NOTE — Patient Instructions (Addendum)
Medicare Annual Wellness Visit  [facility] and the medical providers at [facility] strive to bring you the best medical care.  In doing so we not only want to address your current medical conditions and concerns but also to detect new conditions early and prevent illness, disease and health-related problems.    Medicare offers a yearly Wellness Visit which allows our clinical staff to assess your need for preventative services including immunizations, lifestyle education, counseling to decrease risk of preventable diseases and screening for fall risk and other medical concerns.    This visit is provided free of charge (no copay) for all Medicare recipients. The clinical pharmacists at [facility] have begun to conduct these Wellness Visits which will also include a thorough review of all your medications.    As you primary medical provider recommend that you make an appointment for your Annual Wellness Visit if you have not done so already this year.  You may set up this appointment before you leave today or you may call back ([phone]) and schedule an appointment.  Please make sure when you call that you mention that you are scheduling your Annual Wellness Visit with the clinical pharmacist so that the appointment may be made for the proper length of time.     Continue current medications. Continue good therapeutic lifestyle changes which include good diet and exercise. Fall precautions discussed with patient. If an FOBT was given today- please return it to our front desk. If you are over 6 years old - you may need Prevnar 79 or the adult Pneumonia vaccine.  **Flu shots are available--- please call and schedule a FLU-CLINIC appointment**  After your visit with [place] today you will receive a survey in the mail or online from [employer] regarding your care with [place]. Please take a moment to fill this out. Your feedback is very  important to [place] as you can help [place] better understand your patient needs as well as improve your experience and satisfaction. WE CARE ABOUT YOU!!!   We will asked the patient to come back and get a B12 level just prior to getting her next B12 shot which will be sometime around September 23. She is willing to get a Cologuard test and we will order this for her. She is willing also to get a mammogram as this is important and she will get that done here. She will continue to drink plenty of fluids She will check with the department store and get a good form support pantyhose.  And she will put these on the first thing with arising every morning to see if this makes any difference with her dizziness or weakness. We will make sure they get a thyroid profile CBC and electrolytes as part of her blood work. Check pulses periodically especially when you feel like your heart is too slow and record these.   Continue to watch caffeine in the diet.

## 2018-08-22 ENCOUNTER — Other Ambulatory Visit: Payer: Self-pay | Admitting: *Deleted

## 2018-08-22 DIAGNOSIS — E7800 Pure hypercholesterolemia, unspecified: Secondary | ICD-10-CM

## 2018-08-22 LAB — CBC WITH DIFFERENTIAL/PLATELET
BASOS ABS: 0 10*3/uL (ref 0.0–0.2)
Basos: 0 %
EOS (ABSOLUTE): 0.1 10*3/uL (ref 0.0–0.4)
Eos: 1 %
HEMOGLOBIN: 13.2 g/dL (ref 11.1–15.9)
Hematocrit: 40.8 % (ref 34.0–46.6)
IMMATURE GRANS (ABS): 0 10*3/uL (ref 0.0–0.1)
IMMATURE GRANULOCYTES: 0 %
LYMPHS: 32 %
Lymphocytes Absolute: 1.4 10*3/uL (ref 0.7–3.1)
MCH: 33.2 pg — AB (ref 26.6–33.0)
MCHC: 32.4 g/dL (ref 31.5–35.7)
MCV: 103 fL — ABNORMAL HIGH (ref 79–97)
MONOCYTES: 7 %
Monocytes Absolute: 0.3 10*3/uL (ref 0.1–0.9)
NEUTROS PCT: 60 %
Neutrophils Absolute: 2.6 10*3/uL (ref 1.4–7.0)
PLATELETS: 167 10*3/uL (ref 150–450)
RBC: 3.97 x10E6/uL (ref 3.77–5.28)
RDW: 12.8 % (ref 12.3–15.4)
WBC: 4.5 10*3/uL (ref 3.4–10.8)

## 2018-08-22 LAB — VITAMIN D 25 HYDROXY (VIT D DEFICIENCY, FRACTURES): Vit D, 25-Hydroxy: 61.1 ng/mL (ref 30.0–100.0)

## 2018-08-22 LAB — BMP8+EGFR
BUN/Creatinine Ratio: 23 (ref 12–28)
BUN: 15 mg/dL (ref 8–27)
CALCIUM: 9.1 mg/dL (ref 8.7–10.3)
CHLORIDE: 105 mmol/L (ref 96–106)
CO2: 24 mmol/L (ref 20–29)
Creatinine, Ser: 0.65 mg/dL (ref 0.57–1.00)
GFR, EST AFRICAN AMERICAN: 97 mL/min/{1.73_m2} (ref >59)
GFR, EST NON AFRICAN AMERICAN: 84 mL/min/{1.73_m2} (ref >59)
Glucose: 89 mg/dL (ref 65–99)
Potassium: 4.5 mmol/L (ref 3.5–5.2)
Sodium: 144 mmol/L (ref 134–144)

## 2018-08-22 LAB — LIPID PANEL
CHOLESTEROL TOTAL: 204 mg/dL — AB (ref 100–199)
Chol/HDL Ratio: 3.5 ratio (ref 0.0–4.4)
HDL: 59 mg/dL (ref >39)
LDL CALC: 130 mg/dL — AB (ref 0–99)
TRIGLYCERIDES: 74 mg/dL (ref 0–149)
VLDL CHOLESTEROL CAL: 15 mg/dL (ref 5–40)

## 2018-08-22 LAB — HEPATIC FUNCTION PANEL
ALT: 7 IU/L (ref 0–32)
AST: 11 IU/L (ref 0–40)
Albumin: 4.2 g/dL (ref 3.5–4.7)
Alkaline Phosphatase: 89 IU/L (ref 39–117)
BILIRUBIN, DIRECT: 0.12 mg/dL (ref 0.00–0.40)
Bilirubin Total: 0.4 mg/dL (ref 0.0–1.2)
TOTAL PROTEIN: 6.5 g/dL (ref 6.0–8.5)

## 2018-08-22 LAB — THYROID PANEL WITH TSH
Free Thyroxine Index: 2.1 (ref 1.2–4.9)
T3 Uptake Ratio: 30 % (ref 24–39)
T4 TOTAL: 7 ug/dL (ref 4.5–12.0)
TSH: 2.51 u[IU]/mL (ref 0.450–4.500)

## 2018-08-22 MED ORDER — ROSUVASTATIN CALCIUM 5 MG PO TABS: 2.5000 mg | ORAL_TABLET | ORAL | 3 refills | Status: DC

## 2018-09-06 ENCOUNTER — Ambulatory Visit (INDEPENDENT_AMBULATORY_CARE_PROVIDER_SITE_OTHER): Payer: Medicare Other | Admitting: *Deleted

## 2018-09-06 ENCOUNTER — Other Ambulatory Visit: Payer: Medicare Other

## 2018-09-06 DIAGNOSIS — E538 Deficiency of other specified B group vitamins: Secondary | ICD-10-CM

## 2018-09-06 NOTE — Progress Notes (Signed)
Pt given cyanocobalamin inj Tolerated well 

## 2018-09-07 LAB — VITAMIN B12: Vitamin B-12: 715 pg/mL (ref 232–1245)

## 2018-09-28 ENCOUNTER — Other Ambulatory Visit: Payer: Self-pay | Admitting: Allergy and Immunology

## 2018-09-30 ENCOUNTER — Telehealth: Payer: Self-pay | Admitting: Allergy and Immunology

## 2018-09-30 NOTE — Telephone Encounter (Signed)
Patient needs a refill on medication sent to [facility] will not refill medication -stating that the patient needs to be seen Patient states that she does not need an appt - she was told by [facility] that she was to come in and bee seen as needed She feels fine , she just wants a refill on her medications - everything is going well for her.

## 2018-09-30 NOTE — Telephone Encounter (Signed)
Please explained to the patient that this is a legal issue and we can refill medications up to 1 year without her being seen in clinic.  We can refill for 1 month while she has an opportunity to revisit with [place] or she can have her primary care doctor refill her medications.  It does not really matter who refills her medications but there is a legal requirement for [place] to see the patient at least once a year for your performing the refills.

## 2018-09-30 NOTE — Telephone Encounter (Signed)
Please advise. Your last note states 6 months.

## 2018-09-30 NOTE — Telephone Encounter (Signed)
Attempted to call pt but couldn't get through will have to call back.

## 2018-10-01 NOTE — Telephone Encounter (Signed)
Spoke to pt and put her on Dr. Maudie Mercury schedule for 09/30/18 @ 10AM to continue her Montelukast. Pt will then follow up with Dr. Neldon Mc afterwards.

## 2018-10-03 ENCOUNTER — Ambulatory Visit (INDEPENDENT_AMBULATORY_CARE_PROVIDER_SITE_OTHER): Payer: Medicare Other | Admitting: Allergy

## 2018-10-03 ENCOUNTER — Encounter: Payer: Self-pay | Admitting: Allergy

## 2018-10-03 VITALS — BP 118/72 | Resp 17 | Ht 62.0 in | Wt 124.4 lb

## 2018-10-03 DIAGNOSIS — J310 Chronic rhinitis: Secondary | ICD-10-CM | POA: Insufficient documentation

## 2018-10-03 DIAGNOSIS — R0982 Postnasal drip: Secondary | ICD-10-CM | POA: Diagnosis not present

## 2018-10-03 MED ORDER — MONTELUKAST SODIUM 10 MG PO TABS: ORAL_TABLET | ORAL | 5 refills | Status: DC

## 2018-10-03 MED ORDER — PSEUDOEPHEDRINE-GUAIFENESIN ER 60-600 MG PO TB12: 1.0000 | ORAL_TABLET | Freq: Every day | ORAL | 5 refills | Status: DC

## 2018-10-03 NOTE — Assessment & Plan Note (Signed)
2018 skin testing was negative to environmental allergens. Doing much better with below regimen. Denies any cardiovascular issues.  Continue Singulair 10mg  daily.  Try to use mucinex D once every other day as needed. Discussed that it can affect blood pressure as it contains sudafed.  May use saline spray as needed.  May use loratadine 10mg  once daily as needed.

## 2018-10-03 NOTE — Patient Instructions (Addendum)
Continue singulair 10mg  daily. Try to use mucinex D once every other day as needed. Discussed that it can affect blood pressure. May use sline spray as needed. May use loratadine 10mg  once daily as needed.  Follow up in 1 year.

## 2018-10-03 NOTE — Assessment & Plan Note (Signed)
Same as for PND.

## 2018-10-03 NOTE — Progress Notes (Signed)
Follow Up Note  RE: Rose Martinez MRN: [id number] DOB: [birth year] Date of Office Visit: 10/03/2018  Referring provider: Chipper Herb, MD Primary care provider: Chipper Herb, MD  Chief Complaint: Allergic Rhinitis   History of Present Illness: I had the pleasure of seeing Rose Martinez for a follow up visit at the [facility] on 10/03/2018. She is a 83 y.o. female, who is being followed for allergic rhinitis. Today she is here for regular follow up visit. Her previous allergy office visit was on 10/16/2017 with Dr. Neldon Mc.   Currently on Singulair 10mg  daily, Claritin 10mg  daily, mucinex D in AM with good benefit.  Using [place] saline spray as needed with good benefit.  Feeling much better with above regimen. No issues with her heart or blood pressure. Patient usually has low BP.  Assessment and Plan: Rose Martinez is a 83 y.o. female with: Post-nasal drip 2018 skin testing was negative to environmental allergens. Doing much better with below regimen. Denies any cardiovascular issues.  Continue Singulair 10mg  daily.  Try to use mucinex D once every other day as needed. Discussed that it can affect blood pressure as it contains sudafed.  May use saline spray as needed.  May use loratadine 10mg  once daily as needed.  Chronic rhinitis Same as for PND.  Return in about 1 year (around 10/04/2019).  Meds ordered this encounter  Medications  . pseudoephedrine-guaifenesin (MUCINEX D) 60-600 MG 12 hr tablet    Sig: Take 1 tablet by mouth daily.    Dispense:  30 tablet    Refill:  5  . montelukast (SINGULAIR) 10 MG tablet    Sig: Take one tablet once daily as directed    Dispense:  30 tablet    Refill:  5   Diagnostics: None  Medication List:  Current Outpatient Medications  Medication Sig Dispense Refill  . ALPRAZolam (XANAX) 0.25 MG tablet TAKE (1/2) TABLET THREE TIMES DAILY. 45 tablet 0  . cholecalciferol (VITAMIN D) 1000 UNITS tablet Take 2,000 Units by  mouth daily.     Marland Kitchen loratadine (CLARITIN) 10 MG tablet Take 10 mg 2 (two) times daily by mouth.    . montelukast (SINGULAIR) 10 MG tablet Take one tablet once daily as directed 30 tablet 5  . NASAL SALINE NA Place into the nose.    . Probiotic Product (ALIGN PO) Take 1 capsule by mouth daily.    . propranolol (INDERAL) 20 MG tablet TAKE (1) TABLET DAILY AS DIRECTED. 30 tablet 5  . pseudoephedrine-guaifenesin (MUCINEX D) 60-600 MG 12 hr tablet Take 1 tablet by mouth daily. 30 tablet 5  . rosuvastatin (CRESTOR) 5 MG tablet Take 0.5 tablets (2.5 mg total) by mouth 2 (two) times a week. 12 tablet 3   Current Facility-Administered Medications  Medication Dose Route Frequency Provider Last Rate Last Dose  . cyanocobalamin ((VITAMIN B-12)) injection 1,000 mcg  1,000 mcg Intramuscular Q30 days Chipper Herb, MD   1,000 mcg at 09/06/18 1515   Allergies: Allergies  Allergen Reactions  . Statins     myalgias  . Cephalexin Other (See Comments)    Pt does not remember   . Penicillins Other (See Comments)    Pt's mother was severly allergic, so she has never taken PCN.   I reviewed her past medical history, social history, family history, and environmental history and no significant changes have been reported from previous visit on 10/16/2017.  Review of Systems  Constitutional: Negative for  appetite change, chills, fever and unexpected weight change.  HENT: Negative for congestion and rhinorrhea.   Eyes: Negative for itching.  Respiratory: Negative for cough, chest tightness, shortness of breath and wheezing.   Gastrointestinal: Negative for abdominal pain.  Skin: Negative for rash.  Neurological: Negative for headaches.   Objective: BP 118/72 (BP Location: Left Arm, Patient Position: Sitting, Cuff Size: Normal)   Resp 17   Ht 5\' 2"  (1.575 m)   Wt 124 lb 6.4 oz (56.4 kg)   BMI 22.75 kg/m  Body mass index is 22.75 kg/m. Physical Exam  Constitutional: She is oriented to person, place,  and time. She appears well-developed and well-nourished.  HENT:  Head: Normocephalic and atraumatic.  Right Ear: External ear normal.  Left Ear: External ear normal.  Nose: Nose normal.  Mouth/Throat: Oropharynx is clear and moist.  Eyes: Conjunctivae and EOM are normal.  Neck: Neck supple.  Cardiovascular: Normal rate, regular rhythm and normal heart sounds. Exam reveals no gallop and no friction rub.  No murmur heard. Pulmonary/Chest: Effort normal and breath sounds normal. She has no wheezes. She has no rales.  Lymphadenopathy:    She has no cervical adenopathy.  Neurological: She is alert and oriented to person, place, and time.  Skin: Skin is warm. No rash noted.  Psychiatric: She has a normal mood and affect. Her behavior is normal.  Nursing note and vitals reviewed.  Previous notes and tests were reviewed. The plan was reviewed with the patient/family, and all questions/concerned were addressed.  It was my pleasure to see Rose Martinez today and participate in her care. Please feel free to contact me with any questions or concerns.  Sincerely,  Rexene Alberts, DO Allergy & Immunology  [facility] office: [phone] [place]

## 2018-10-04 ENCOUNTER — Ambulatory Visit (INDEPENDENT_AMBULATORY_CARE_PROVIDER_SITE_OTHER): Payer: Medicare Other | Admitting: *Deleted

## 2018-10-04 DIAGNOSIS — E538 Deficiency of other specified B group vitamins: Secondary | ICD-10-CM

## 2018-10-04 NOTE — Progress Notes (Signed)
Pt given Cyanocobalamin inj Tolerated well 

## 2018-10-15 DIAGNOSIS — Z1231 Encounter for screening mammogram for malignant neoplasm of breast: Secondary | ICD-10-CM | POA: Diagnosis not present

## 2018-10-15 LAB — HM MAMMOGRAPHY

## 2018-10-15 NOTE — Progress Notes (Signed)
    HPI The patient presents for followup of palpitations.  She is had some mild palpitations.  These are not particularly bothersome.  She drinks a little caffeine.  She walks a couple miles per day.  She takes care of her home ([facility]) which is 81 years old.  She also has 10 cats.  She is not having any chest pressure, neck or arm discomfort.  Is not having any new shortness of breath, PND or orthopnea.   Allergies  Allergen Reactions  . Statins     myalgias  . Cephalexin Other (See Comments)    Pt does not remember   . Penicillins Other (See Comments)    Pt's mother was severly allergic, so she has never taken PCN.    Current Outpatient Medications  Medication Sig Dispense Refill  . ALPRAZolam (XANAX) 0.25 MG tablet TAKE (1/2) TABLET THREE TIMES DAILY. 45 tablet 0  . cholecalciferol (VITAMIN D) 1000 UNITS tablet Take 2,000 Units by mouth daily.     Marland Kitchen loratadine (CLARITIN) 10 MG tablet Take 10 mg by mouth daily.     . montelukast (SINGULAIR) 10 MG tablet Take one tablet once daily as directed 30 tablet 5  . Probiotic Product (ALIGN PO) Take 1 capsule by mouth daily.    . pseudoephedrine-guaifenesin (MUCINEX D) 60-600 MG 12 hr tablet Take 1 tablet by mouth daily. 30 tablet 5  . propranolol (INDERAL) 20 MG tablet TAKE (1) TABLET DAILY AS DIRECTED. (Patient not taking: Reported on 10/16/2018) 30 tablet 5  . rosuvastatin (CRESTOR) 5 MG tablet Take 0.5 tablets (2.5 mg total) by mouth 2 (two) times a week. (Patient not taking: Reported on 10/16/2018) 12 tablet 3   Current Facility-Administered Medications  Medication Dose Route Frequency Provider Last Rate Last Dose  . cyanocobalamin ((VITAMIN B-12)) injection 1,000 mcg  1,000 mcg Intramuscular Q30 days Chipper Herb, MD   1,000 mcg at 10/04/18 1505    Past Medical History:  Diagnosis Date  . Anxiety   . Hyperlipidemia    Mild  . Osteoporosis   . Palpitations     Past Surgical History:  Procedure Laterality Date  .  CYSTECTOMY     Breast  . EYE SURGERY    . OOPHORECTOMY      ROS:    As stated in the HPI and negative for all other systems.   PHYSICAL EXAM BP 126/70   Pulse 60   Ht 5\' 2"  (1.575 m)   Wt 123 lb (55.8 kg)   BMI 22.50 kg/m   GENERAL:  Well appearing NECK:  No jugular venous distention, waveform within normal limits, carotid upstroke brisk and symmetric, no bruits, no thyromegaly LUNGS:  Clear to auscultation bilaterally CHEST:  Unremarkable HEART:  PMI not displaced or sustained,S1 and S2 within normal limits, no S3, no S4, no clicks, no rubs, no murmurs ABD:  Flat, positive bowel sounds normal in frequency in pitch, no bruits, no rebound, no guarding, no midline pulsatile mass, no hepatomegaly, no splenomegaly EXT:  2 plus pulses throughout, no edema, no cyanosis no clubbing   EKG: Sinus rhythm, rate 60, axis within normal limits, intervals within normal limits ST-T wave changes.  ASSESSMENT AND PLAN  PALPITATIONS: These are infrequent and not particularly bothersome.  No change in therapy.

## 2018-10-16 ENCOUNTER — Ambulatory Visit (INDEPENDENT_AMBULATORY_CARE_PROVIDER_SITE_OTHER): Payer: Medicare Other | Admitting: Cardiology

## 2018-10-16 ENCOUNTER — Encounter: Payer: Self-pay | Admitting: Cardiology

## 2018-10-16 VITALS — BP 126/70 | HR 60 | Ht 62.0 in | Wt 123.0 lb

## 2018-10-16 DIAGNOSIS — R002 Palpitations: Secondary | ICD-10-CM

## 2018-10-16 NOTE — Patient Instructions (Signed)
Medication Instructions:  The current medical regimen is effective;  continue present plan and medications.  Follow-Up: Follow up in 1 year with Dr. Percival Spanish.  You will receive a letter in the mail 2 months before you are due.  Please call us when you receive this letter to schedule your follow up appointment.  Thank you for choosing [facility]!!

## 2018-10-17 NOTE — Progress Notes (Unsigned)
a 

## 2018-11-01 ENCOUNTER — Ambulatory Visit (INDEPENDENT_AMBULATORY_CARE_PROVIDER_SITE_OTHER): Payer: Medicare Other | Admitting: *Deleted

## 2018-11-01 DIAGNOSIS — E538 Deficiency of other specified B group vitamins: Secondary | ICD-10-CM

## 2018-11-01 NOTE — Progress Notes (Signed)
Pt given Cyanocobalamin inj Tolerated well 

## 2018-11-12 ENCOUNTER — Ambulatory Visit (INDEPENDENT_AMBULATORY_CARE_PROVIDER_SITE_OTHER): Payer: Medicare Other | Admitting: Allergy and Immunology

## 2018-11-12 ENCOUNTER — Encounter: Payer: Self-pay | Admitting: Allergy and Immunology

## 2018-11-12 VITALS — BP 122/60 | HR 64 | Resp 16

## 2018-11-12 DIAGNOSIS — J3089 Other allergic rhinitis: Secondary | ICD-10-CM

## 2018-11-12 MED ORDER — PSEUDOEPHEDRINE-GUAIFENESIN ER 60-600 MG PO TB12: ORAL_TABLET | ORAL | 11 refills | Status: DC

## 2018-11-12 MED ORDER — MONTELUKAST SODIUM 10 MG PO TABS: ORAL_TABLET | ORAL | 11 refills | Status: DC

## 2018-11-12 NOTE — Patient Instructions (Addendum)
  1. Continue the following   A. loratadine 10 mg 1-2 times a day  B. Mucinex D 1-2 times a day    C. montelukast 10 mg once a day  2. Nasal saline if needed  3. Return to clinic in 1 year or earlier if problem

## 2018-11-12 NOTE — Progress Notes (Signed)
Follow-up Note  Referring Provider: Chipper Herb, MD Primary Provider: Chipper Herb, MD Date of Office Visit: 11/12/2018  Subjective:   Rose Martinez (DOB: Dec 11, 1935) is a 83 y.o. female who returns to the [facility] on 11/12/2018 in re-evaluation of the following:  HPI: [name] returns to this clinic in reevaluation of allergic rhinitis.  I last saw her in his clinic on 16 October 2017 at which point in time she was doing relatively well on her current medical plan which includes loratadine and Mucinex D and montelukast.  Overall she has really done quite well for the past year and has not had any significant problems with her airways.  She has not required a systemic steroid or antibiotic to treat her respiratory tract issue.  Overall she is very happy with the response that she has received regarding her upper airway issues and drainage issues while using utilizing her current plan.  She did visit with Dr. Elwyn Reach about 8 months ago regarding an ear issue.  Her medical therapy at this point in time includes loratadine 10 mg, Mucinex D 1 tablet, and montelukast 10 mg daily.  She does not receive the flu vaccine.  Allergies as of 11/12/2018      Reactions   Statins    myalgias   Cephalexin Other (See Comments)   Pt does not remember   Penicillins Other (See Comments)   Pt's mother was severly allergic, so she has never taken PCN.      Medication List      ALIGN PO Take 1 capsule by mouth daily.   ALPRAZolam 0.25 MG tablet Commonly known as:  XANAX TAKE (1/2) TABLET THREE TIMES DAILY.   cholecalciferol 1000 units tablet Commonly known as:  VITAMIN D Take 2,000 Units by mouth daily.   loratadine 10 MG tablet Commonly known as:  CLARITIN Take 10 mg by mouth daily.   montelukast 10 MG tablet Commonly known as:  SINGULAIR Take one tablet once daily as directed   propranolol 20 MG tablet Commonly known as:  INDERAL TAKE (1) TABLET DAILY AS  DIRECTED.   pseudoephedrine-guaifenesin 60-600 MG 12 hr tablet Commonly known as:  MUCINEX D Take 1 table 1-2 times daily   rosuvastatin 5 MG tablet Commonly known as:  CRESTOR Take 0.5 tablets (2.5 mg total) by mouth 2 (two) times a week.       Past Medical History:  Diagnosis Date  . Anxiety   . Hyperlipidemia    Mild  . Osteoporosis   . Palpitations     Past Surgical History:  Procedure Laterality Date  . CYSTECTOMY     Breast  . EYE SURGERY    . OOPHORECTOMY      Review of systems negative except as noted in HPI / PMHx or noted below:  Review of Systems  Constitutional: Negative.   HENT: Negative.   Eyes: Negative.   Respiratory: Negative.   Cardiovascular: Negative.   Gastrointestinal: Negative.   Genitourinary: Negative.   Musculoskeletal: Negative.   Skin: Negative.   Neurological: Negative.   Endo/Heme/Allergies: Negative.   Psychiatric/Behavioral: Negative.      Objective:   Vitals:   11/12/18 1332  BP: 122/60  Pulse: 64  Resp: 16          Physical Exam  HENT:  Head: Normocephalic.  Right Ear: Tympanic membrane, external ear and ear canal normal.  Left Ear: Tympanic membrane, external ear and ear canal normal.  Nose: Nose normal. No mucosal edema or rhinorrhea.  Mouth/Throat: Uvula is midline, oropharynx is clear and moist and mucous membranes are normal. No oropharyngeal exudate.  Eyes: Conjunctivae are normal.  Neck: Trachea normal. No tracheal tenderness present. No tracheal deviation present. No thyromegaly present.  Cardiovascular: Normal rate, regular rhythm, S1 normal, S2 normal and normal heart sounds.  No murmur heard. Pulmonary/Chest: Breath sounds normal. No stridor. No respiratory distress. She has no wheezes. She has no rales.  Musculoskeletal: She exhibits no edema.  Lymphadenopathy:       Head (right side): No tonsillar adenopathy present.       Head (left side): No tonsillar adenopathy present.    She has no cervical  adenopathy.  Neurological: She is alert.  Skin: No rash noted. She is not diaphoretic. No erythema. Nails show no clubbing.    Diagnostics: None  Assessment and Plan:   1. Other allergic rhinitis     1. Continue the following   A. loratadine 10 mg 1-2 times a day  B. Mucinex D 1-2 times a day    C. montelukast 10 mg once a day  2. Nasal saline if needed  3. Return to clinic in 1 year or earlier if problem  Shynice is doing great.  I have refilled all her medications and I will see her back in this clinic in 1 year or earlier if there is a problem.  Allena Katz, MD Allergy / Immunology [place]

## 2018-11-13 ENCOUNTER — Encounter: Payer: Self-pay | Admitting: Allergy and Immunology

## 2018-11-29 ENCOUNTER — Ambulatory Visit (INDEPENDENT_AMBULATORY_CARE_PROVIDER_SITE_OTHER): Payer: Medicare Other | Admitting: *Deleted

## 2018-11-29 DIAGNOSIS — E538 Deficiency of other specified B group vitamins: Secondary | ICD-10-CM

## 2018-11-29 NOTE — Progress Notes (Signed)
Pt given cyanocobalamin inj Tolerated well 

## 2018-12-27 ENCOUNTER — Ambulatory Visit (INDEPENDENT_AMBULATORY_CARE_PROVIDER_SITE_OTHER): Payer: Medicare Other | Admitting: *Deleted

## 2018-12-27 DIAGNOSIS — E538 Deficiency of other specified B group vitamins: Secondary | ICD-10-CM

## 2018-12-27 NOTE — Progress Notes (Signed)
Pt given Cyanocobalamin inj Tolerated well 

## 2019-01-16 ENCOUNTER — Other Ambulatory Visit: Payer: Self-pay | Admitting: Family Medicine

## 2019-01-17 NOTE — Telephone Encounter (Signed)
Last seen 9/4 last filled 8/26

## 2019-01-20 DIAGNOSIS — D1801 Hemangioma of skin and subcutaneous tissue: Secondary | ICD-10-CM | POA: Diagnosis not present

## 2019-01-20 DIAGNOSIS — L245 Irritant contact dermatitis due to other chemical products: Secondary | ICD-10-CM | POA: Diagnosis not present

## 2019-01-20 DIAGNOSIS — L218 Other seborrheic dermatitis: Secondary | ICD-10-CM | POA: Diagnosis not present

## 2019-01-20 DIAGNOSIS — D225 Melanocytic nevi of trunk: Secondary | ICD-10-CM | POA: Diagnosis not present

## 2019-01-20 DIAGNOSIS — L821 Other seborrheic keratosis: Secondary | ICD-10-CM | POA: Diagnosis not present

## 2019-01-20 DIAGNOSIS — L570 Actinic keratosis: Secondary | ICD-10-CM | POA: Diagnosis not present

## 2019-01-20 DIAGNOSIS — L720 Epidermal cyst: Secondary | ICD-10-CM | POA: Diagnosis not present

## 2019-01-20 DIAGNOSIS — L812 Freckles: Secondary | ICD-10-CM | POA: Diagnosis not present

## 2019-01-20 DIAGNOSIS — L820 Inflamed seborrheic keratosis: Secondary | ICD-10-CM | POA: Diagnosis not present

## 2019-01-24 ENCOUNTER — Ambulatory Visit (INDEPENDENT_AMBULATORY_CARE_PROVIDER_SITE_OTHER): Payer: Medicare Other | Admitting: *Deleted

## 2019-01-24 DIAGNOSIS — E538 Deficiency of other specified B group vitamins: Secondary | ICD-10-CM | POA: Diagnosis not present

## 2019-01-24 NOTE — Progress Notes (Signed)
B12 injection given and tolerated well.  

## 2019-01-31 DIAGNOSIS — Z961 Presence of intraocular lens: Secondary | ICD-10-CM | POA: Diagnosis not present

## 2019-02-20 ENCOUNTER — Ambulatory Visit: Payer: Medicare Other | Admitting: Family Medicine

## 2019-02-20 ENCOUNTER — Ambulatory Visit (INDEPENDENT_AMBULATORY_CARE_PROVIDER_SITE_OTHER): Payer: Medicare Other | Admitting: *Deleted

## 2019-02-20 DIAGNOSIS — E538 Deficiency of other specified B group vitamins: Secondary | ICD-10-CM

## 2019-02-20 NOTE — Progress Notes (Signed)
Pt given Cyanocobalamin inj Tolerated well 

## 2019-02-21 ENCOUNTER — Ambulatory Visit: Payer: Medicare Other

## 2019-03-10 ENCOUNTER — Ambulatory Visit: Payer: Medicare Other | Admitting: Family Medicine

## 2019-04-01 ENCOUNTER — Other Ambulatory Visit: Payer: Self-pay | Admitting: Family Medicine

## 2019-04-01 ENCOUNTER — Ambulatory Visit: Payer: Medicare Other

## 2019-04-01 DIAGNOSIS — J309 Allergic rhinitis, unspecified: Secondary | ICD-10-CM | POA: Diagnosis not present

## 2019-04-01 DIAGNOSIS — H6982 Other specified disorders of Eustachian tube, left ear: Secondary | ICD-10-CM | POA: Diagnosis not present

## 2019-04-02 ENCOUNTER — Other Ambulatory Visit: Payer: Self-pay

## 2019-04-02 ENCOUNTER — Ambulatory Visit (INDEPENDENT_AMBULATORY_CARE_PROVIDER_SITE_OTHER): Payer: Medicare Other | Admitting: *Deleted

## 2019-04-02 DIAGNOSIS — E538 Deficiency of other specified B group vitamins: Secondary | ICD-10-CM | POA: Diagnosis not present

## 2019-04-02 MED ORDER — CYANOCOBALAMIN 1000 MCG/ML IJ SOLN
1000.0000 ug | INTRAMUSCULAR | Status: AC
  Administered 2019-04-02 – 2020-02-09 (×12): 1000 ug via INTRAMUSCULAR

## 2019-04-02 NOTE — Progress Notes (Signed)
Pt given Cyanocobalamin inj Tolerated well 

## 2019-04-07 ENCOUNTER — Ambulatory Visit (INDEPENDENT_AMBULATORY_CARE_PROVIDER_SITE_OTHER): Payer: Medicare Other | Admitting: Family Medicine

## 2019-04-07 ENCOUNTER — Other Ambulatory Visit: Payer: Self-pay

## 2019-04-07 DIAGNOSIS — M79604 Pain in right leg: Secondary | ICD-10-CM | POA: Diagnosis not present

## 2019-04-07 DIAGNOSIS — I080 Rheumatic disorders of both mitral and aortic valves: Secondary | ICD-10-CM | POA: Diagnosis not present

## 2019-04-07 DIAGNOSIS — R0982 Postnasal drip: Secondary | ICD-10-CM

## 2019-04-07 DIAGNOSIS — M810 Age-related osteoporosis without current pathological fracture: Secondary | ICD-10-CM | POA: Diagnosis not present

## 2019-04-07 DIAGNOSIS — E538 Deficiency of other specified B group vitamins: Secondary | ICD-10-CM

## 2019-04-07 DIAGNOSIS — J310 Chronic rhinitis: Secondary | ICD-10-CM | POA: Diagnosis not present

## 2019-04-07 DIAGNOSIS — R002 Palpitations: Secondary | ICD-10-CM | POA: Diagnosis not present

## 2019-04-07 DIAGNOSIS — E559 Vitamin D deficiency, unspecified: Secondary | ICD-10-CM

## 2019-04-07 DIAGNOSIS — E782 Mixed hyperlipidemia: Secondary | ICD-10-CM

## 2019-04-07 NOTE — Addendum Note (Signed)
Addended by: Zannie Cove on: 04/07/2019 01:35 PM   Modules accepted: Orders

## 2019-04-07 NOTE — Patient Instructions (Addendum)
She should continue with her current medicines She should follow-up and follow through with recommendations from her ear specialist, Dr. Allena Katz She should continue to follow-up with her cardiologist as planned She should continue to drink plenty of fluids and stay well-hydrated As the weather warms she should get out and do more walking to keep her bones strong and always be careful not to put herself at risk of falling and sustaining a fracture The patient should try wearing some Finn comfort shoes for her feet especially to wear these early in the morning when getting out of the bed. She should also try using some nasal saline for nasal congestion in addition to her other allergy medicines and continue to drink plenty of fluids as already recommended.

## 2019-04-07 NOTE — Progress Notes (Addendum)
Virtual Visit Via telephone Note I connected with@ on 04/07/19 by telephone and verified that I am speaking with the correct person or authorized healthcare agent using two identifiers. Rose Martinez is currently located at home and there are no unauthorized people in close proximity. I completed this visit while in a private location in my home .  I connected to the patient by telephone and verified that I am speaking with the correct person.  This visit type was conducted due to national recommendations for restrictions regarding the COVID-19 Pandemic (e.g. social distancing).  This format is felt to be most appropriate for this patient at this time.  All issues noted in this document were discussed and addressed.  No physical exam was performed.    I discussed the limitations, risks, security and privacy concerns of performing an evaluation and management service by telephone and the availability of in person appointments. I also discussed with the patient that there may be a patient responsible charge related to this service. The patient expressed understanding and agreed to proceed.   Date:  04/07/2019    ID:  Rose Martinez      12-29-1934        [id number]   Patient Care Team Patient Care Team: Chipper Herb, MD as PCP - General (Family Medicine)  Reason for Visit: Primary Care Follow-up     History of Present Illness & Review of Systems:     Rose Martinez is a 84 y.o. year old female primary care patient that presents today for a telehealth visit.  I have been following this patient for many years.  Overall she is stable and has mostly problems with elevated cholesterol palpitations stress-related anxiety and sinus and allergy and head congestion issues.  She is followed regularly by the cardiologist and periodically by Dr. Thornell Mule her ear specialist.  She is also seen by Dr. Neldon Mc.  The patient denies any chest pain pressure tightness palpitations or shortness of  breath.  She denies any trouble with her stomach or change in bowel habits or blood in the stool.  She is passing her water well.  She is also follow a Mediterranean diet and tries to eat well and does have some caffeine early in the morning and a Coke later on in the morning this makes her feel better and does not seem to aggravate her palpitations.  She did see Dr. Elwyn Reach recently he thought that she had eustachian tube dysfunction and recommended that she use Ciprodex drops as needed.  She will see him again in September in [place].  She also today complains of decreased energy in the morning and the caffeine seems to help this.  She complains of some right big toe pain and stiffness that seems to get better with walking.  She also has complaints with the bottom of her feet feeling funny like a neuropathy type problem but this seems to go away after walking.  I did recommend thin comfort shoes to try that she will wear when she first gets out of the bed in the morning.  Dr. Thornell Mule did add Flonase to her upper airway congestion issues and I also recommended saline nose spray.  Review of systems as stated otherwise negative for body systems left unmentioned.   The patient does not have symptoms concerning for COVID-19 infection (fever, chills, cough, or new shortness of breath).      Current Medications (Verified) Allergies as of  04/07/2019      Reactions   Statins    myalgias   Cephalexin Other (See Comments)   Pt does not remember   Penicillins Other (See Comments)   Pt's mother was severly allergic, so she has never taken PCN.      Medication List       Accurate as of April 07, 2019  7:47 AM. Always use your most recent med list.        ALIGN PO Take 1 capsule by mouth daily.   ALPRAZolam 0.25 MG tablet Commonly known as:  XANAX TAKE (1/2) TABLET THREE TIMES DAILY.   cholecalciferol 1000 units tablet Commonly known as:  VITAMIN D Take 2,000 Units by mouth daily.    loratadine 10 MG tablet Commonly known as:  CLARITIN Take 10 mg by mouth daily.   montelukast 10 MG tablet Commonly known as:  SINGULAIR Take one tablet once daily as directed   propranolol 20 MG tablet Commonly known as:  INDERAL TAKE (1) TABLET DAILY AS DIRECTED.   pseudoephedrine-guaifenesin 60-600 MG 12 hr tablet Commonly known as:  MUCINEX D Take 1 table 1-2 times daily   rosuvastatin 5 MG tablet Commonly known as:  Crestor Take 0.5 tablets (2.5 mg total) by mouth 2 (two) times a week.           Allergies (Verified)    Statins; Cephalexin; and Penicillins  Past Medical History Past Medical History:  Diagnosis Date  . Anxiety   . Hyperlipidemia    Mild  . Osteoporosis   . Palpitations      Past Surgical History:  Procedure Laterality Date  . CYSTECTOMY     Breast  . EYE SURGERY    . OOPHORECTOMY      Social History   Socioeconomic History  . Marital status: Divorced    Spouse name: Not on file  . Number of children: 1  . Years of education: Not on file  . Highest education level: Not on file  Occupational History  . Not on file  Social Needs  . Financial resource strain: Not on file  . Food insecurity:    Worry: Not on file    Inability: Not on file  . Transportation needs:    Medical: Not on file    Non-medical: Not on file  Tobacco Use  . Smoking status: Former Smoker    Last attempt to quit: 03/27/1957    Years since quitting: 62.0  . Smokeless tobacco: Never Used  Substance and Sexual Activity  . Alcohol use: No  . Drug use: No  . Sexual activity: Not on file  Lifestyle  . Physical activity:    Days per week: Not on file    Minutes per session: Not on file  . Stress: Not on file  Relationships  . Social connections:    Talks on phone: Not on file    Gets together: Not on file    Attends religious service: Not on file    Active member of club or organization: Not on file    Attends meetings of clubs or organizations: Not on  file    Relationship status: Not on file  Other Topics Concern  . Not on file  Social History Narrative   Her one child and 2 grandchildren live in [place].     Family History  Problem Relation Age of Onset  . Heart attack Father   . Other Mother        ployarthritis Nodosa  Labs/Other Tests and Data Reviewed:    Wt Readings from Last 3 Encounters:  10/16/18 123 lb (55.8 kg)  10/03/18 124 lb 6.4 oz (56.4 kg)  08/21/18 120 lb (54.4 kg)   Temp Readings from Last 3 Encounters:  08/21/18 (!) 97.1 F (36.2 C) (Oral)  03/06/18 (!) 96.4 F (35.8 C) (Oral)  11/05/17 (!) 97.5 F (36.4 C) (Oral)   BP Readings from Last 3 Encounters:  11/12/18 122/60  10/16/18 126/70  10/03/18 118/72   Pulse Readings from Last 3 Encounters:  11/12/18 64  10/16/18 60  08/21/18 61     No results found for: HGBA1C Lab Results  Component Value Date   LDLCALC 130 (H) 08/21/2018   CREATININE 0.65 08/21/2018       Chemistry      Component Value Date/Time   NA 144 08/21/2018 1109   K 4.5 08/21/2018 1109   CL 105 08/21/2018 1109   CO2 24 08/21/2018 1109   BUN 15 08/21/2018 1109   CREATININE 0.65 08/21/2018 1109      Component Value Date/Time   CALCIUM 9.1 08/21/2018 1109   ALKPHOS 89 08/21/2018 1109   AST 11 08/21/2018 1109   ALT 7 08/21/2018 1109   BILITOT 0.4 08/21/2018 1109         OBSERVATIONS/ OBJECTIVE:     Patient was alert and in good spirits and related her visits to her allergy specialist her ear specialist and her cardiologist well.  She discussed her current issues with discomfort in her feet especially her right big toe and denied any other complaints or symptoms other than her eustachian tube dysfunction which is currently doing well with the Ciprodex drops.  She has follow-up visits arranged with ear nose and throat allergy and cardiology.  She will come by the office and get routine lab work which will make sure we include as having a B12 level vitamin D  level sed rate and uric acid.  This should be along with her cholesterol labs.  She is not taking any Crestor has always been reluctant to take this.  She will get her next B 12 shot on May 13.  Physical exam deferred due to nature of telephonic visit.  ASSESSMENT & PLAN    Time:   Today, I have spent 35 minutes with the patient via telephone discussing the above including Covid precautions.     Visit Diagnoses: 1. MITRAL REGURGITATION -Aloe up with Dr. Percival Spanish as planned  2. Chronic rhinitis -Follow-up with allergist as planned  3. Age-related osteoporosis without current pathological fracture -Stay current with DEXA scans  4. Low vitamin B12 level -Continue with B12 injections -Get B12 level with next injection  5. Palpitations -This is minimal currently and she will continue to watch her caffeine intake.  6. Vitamin D deficiency -Continue with vitamin D 2000 units daily  7. Mixed hyperlipidemia -Continue to watch diet closely  8. Post-nasal drip -Continue with Claritin Singulair Mucinex D and Flonase as directed by your specialist and allergy specialist  9.  Foot and toe pain, -Get x-rays of foot and big toe if problems continue -With next lab work get uric acid sed rate  Patient Instructions  She should continue with her current medicines She should follow-up and follow through with recommendations from her ear specialist, Dr. Allena Katz She should continue to follow-up with her cardiologist as planned She should continue to drink plenty of fluids and stay well-hydrated As the weather warms she should get out and  do more walking to keep her bones strong and always be careful not to put herself at risk of falling and sustaining a fracture The patient should try wearing some Finn comfort shoes for her feet especially to wear these early in the morning when getting out of the bed. She should also try using some nasal saline for nasal congestion in addition to her  other allergy medicines and continue to drink plenty of fluids as already recommended.     The above assessment and management plan was discussed with the patient. The patient verbalized understanding of and has agreed to the management plan. Patient is aware to call the clinic if symptoms persist or worsen. Patient is aware when to return to the clinic for a follow-up visit. Patient educated on when it is appropriate to go to the emergency department.    Chipper Herb, MD [address], [place], [place] [address] Ph [phone]  Arrie Senate MD

## 2019-04-17 ENCOUNTER — Ambulatory Visit: Payer: Medicare Other

## 2019-04-17 ENCOUNTER — Other Ambulatory Visit: Payer: Medicare Other

## 2019-04-17 ENCOUNTER — Other Ambulatory Visit: Payer: Self-pay

## 2019-04-17 ENCOUNTER — Telehealth: Payer: Self-pay | Admitting: Family Medicine

## 2019-04-17 DIAGNOSIS — R002 Palpitations: Secondary | ICD-10-CM

## 2019-04-17 DIAGNOSIS — E782 Mixed hyperlipidemia: Secondary | ICD-10-CM | POA: Diagnosis not present

## 2019-04-17 DIAGNOSIS — E538 Deficiency of other specified B group vitamins: Secondary | ICD-10-CM | POA: Diagnosis not present

## 2019-04-17 DIAGNOSIS — I080 Rheumatic disorders of both mitral and aortic valves: Secondary | ICD-10-CM

## 2019-04-17 DIAGNOSIS — R0982 Postnasal drip: Secondary | ICD-10-CM

## 2019-04-17 DIAGNOSIS — E559 Vitamin D deficiency, unspecified: Secondary | ICD-10-CM | POA: Diagnosis not present

## 2019-04-17 DIAGNOSIS — M79604 Pain in right leg: Secondary | ICD-10-CM | POA: Diagnosis not present

## 2019-04-17 DIAGNOSIS — M810 Age-related osteoporosis without current pathological fracture: Secondary | ICD-10-CM

## 2019-04-17 DIAGNOSIS — J310 Chronic rhinitis: Secondary | ICD-10-CM | POA: Diagnosis not present

## 2019-04-18 LAB — LIPID PANEL
Chol/HDL Ratio: 3.6 ratio (ref 0.0–4.4)
Cholesterol, Total: 225 mg/dL — ABNORMAL HIGH (ref 100–199)
HDL: 63 mg/dL (ref >39)
LDL Calculated: 142 mg/dL — ABNORMAL HIGH (ref 0–99)
Triglycerides: 100 mg/dL (ref 0–149)
VLDL Cholesterol Cal: 20 mg/dL (ref 5–40)

## 2019-04-18 LAB — BMP8+EGFR
BUN/Creatinine Ratio: 19 (ref 12–28)
BUN: 14 mg/dL (ref 8–27)
CO2: 24 mmol/L (ref 20–29)
Calcium: 9.5 mg/dL (ref 8.7–10.3)
Chloride: 104 mmol/L (ref 96–106)
Creatinine, Ser: 0.74 mg/dL (ref 0.57–1.00)
GFR calc Af Amer: 88 mL/min/{1.73_m2} (ref >59)
GFR calc non Af Amer: 76 mL/min/{1.73_m2} (ref >59)
Glucose: 70 mg/dL (ref 65–99)
Potassium: 4.3 mmol/L (ref 3.5–5.2)
Sodium: 144 mmol/L (ref 134–144)

## 2019-04-18 LAB — CBC WITH DIFFERENTIAL/PLATELET
Basophils Absolute: 0 10*3/uL (ref 0.0–0.2)
Basos: 1 %
EOS (ABSOLUTE): 0.1 10*3/uL (ref 0.0–0.4)
Eos: 2 %
Hematocrit: 41.4 % (ref 34.0–46.6)
Hemoglobin: 13.6 g/dL (ref 11.1–15.9)
Immature Grans (Abs): 0 10*3/uL (ref 0.0–0.1)
Immature Granulocytes: 0 %
Lymphocytes Absolute: 1.5 10*3/uL (ref 0.7–3.1)
Lymphs: 32 %
MCH: 32.7 pg (ref 26.6–33.0)
MCHC: 32.9 g/dL (ref 31.5–35.7)
MCV: 100 fL — ABNORMAL HIGH (ref 79–97)
Monocytes Absolute: 0.3 10*3/uL (ref 0.1–0.9)
Monocytes: 7 %
Neutrophils Absolute: 2.7 10*3/uL (ref 1.4–7.0)
Neutrophils: 58 %
Platelets: 170 10*3/uL (ref 150–450)
RBC: 4.16 x10E6/uL (ref 3.77–5.28)
RDW: 11.6 % — ABNORMAL LOW (ref 11.7–15.4)
WBC: 4.6 10*3/uL (ref 3.4–10.8)

## 2019-04-18 LAB — HEPATIC FUNCTION PANEL
ALT: 13 IU/L (ref 0–32)
AST: 14 IU/L (ref 0–40)
Albumin: 4.5 g/dL (ref 3.6–4.6)
Alkaline Phosphatase: 91 IU/L (ref 39–117)
Bilirubin Total: 0.3 mg/dL (ref 0.0–1.2)
Bilirubin, Direct: 0.09 mg/dL (ref 0.00–0.40)
Total Protein: 6.8 g/dL (ref 6.0–8.5)

## 2019-04-18 LAB — URIC ACID: Uric Acid: 3.7 mg/dL (ref 2.5–7.1)

## 2019-04-18 LAB — VITAMIN B12: Vitamin B-12: 833 pg/mL (ref 232–1245)

## 2019-04-18 LAB — VITAMIN D 25 HYDROXY (VIT D DEFICIENCY, FRACTURES): Vit D, 25-Hydroxy: 62.2 ng/mL (ref 30.0–100.0)

## 2019-04-18 LAB — SEDIMENTATION RATE: Sed Rate: 5 mm/hr (ref 0–40)

## 2019-04-22 ENCOUNTER — Other Ambulatory Visit: Payer: Self-pay | Admitting: Family Medicine

## 2019-04-30 ENCOUNTER — Ambulatory Visit (INDEPENDENT_AMBULATORY_CARE_PROVIDER_SITE_OTHER): Payer: Medicare Other | Admitting: *Deleted

## 2019-04-30 ENCOUNTER — Other Ambulatory Visit: Payer: Self-pay

## 2019-04-30 DIAGNOSIS — E538 Deficiency of other specified B group vitamins: Secondary | ICD-10-CM

## 2019-04-30 NOTE — Progress Notes (Signed)
Pt given Cyanocobalamin inj Tolerated well 

## 2019-05-16 ENCOUNTER — Other Ambulatory Visit: Payer: Self-pay | Admitting: *Deleted

## 2019-05-16 MED ORDER — ALPRAZOLAM 0.25 MG PO TABS: ORAL_TABLET | ORAL | 1 refills | Status: DC

## 2019-05-28 ENCOUNTER — Ambulatory Visit (INDEPENDENT_AMBULATORY_CARE_PROVIDER_SITE_OTHER): Payer: Medicare Other | Admitting: *Deleted

## 2019-05-28 ENCOUNTER — Other Ambulatory Visit: Payer: Self-pay

## 2019-05-28 DIAGNOSIS — E538 Deficiency of other specified B group vitamins: Secondary | ICD-10-CM

## 2019-06-04 ENCOUNTER — Telehealth: Payer: Self-pay | Admitting: Family Medicine

## 2019-06-04 NOTE — Telephone Encounter (Signed)
Pt called about appt  

## 2019-06-04 NOTE — Telephone Encounter (Signed)
PT is wanting to speak to Roselyn Reef about an apt,

## 2019-06-10 ENCOUNTER — Other Ambulatory Visit: Payer: Self-pay | Admitting: *Deleted

## 2019-06-10 MED ORDER — FLUTICASONE PROPIONATE 50 MCG/ACT NA SUSP: NASAL | 11 refills | Status: DC

## 2019-06-13 ENCOUNTER — Telehealth: Payer: Self-pay | Admitting: *Deleted

## 2019-06-13 MED ORDER — MIRABEGRON ER 25 MG PO TB24: 25.0000 mg | ORAL_TABLET | Freq: Every day | ORAL | 0 refills | Status: DC

## 2019-06-13 NOTE — Telephone Encounter (Signed)
This is for overactive bladder.  Start with 25 mg daily and if that does not work increased to 50 mg daily as needed.  Give her 14 pills of the 25 mg and if that works can give more at a time in the future

## 2019-06-13 NOTE — Telephone Encounter (Signed)
Sent in

## 2019-06-23 ENCOUNTER — Ambulatory Visit: Payer: Medicare Other

## 2019-06-24 ENCOUNTER — Other Ambulatory Visit: Payer: Self-pay

## 2019-06-25 ENCOUNTER — Ambulatory Visit (INDEPENDENT_AMBULATORY_CARE_PROVIDER_SITE_OTHER): Payer: Medicare Other | Admitting: *Deleted

## 2019-06-25 DIAGNOSIS — E538 Deficiency of other specified B group vitamins: Secondary | ICD-10-CM

## 2019-06-25 NOTE — Progress Notes (Signed)
Pt given Cyanocobalamin inj Tolerated well 

## 2019-07-08 IMAGING — DX DG FOOT COMPLETE 3+V*L*
3 series · 3 of 3 positions shown · non-contrast
Comparison: None.

CLINICAL DATA: Left heel pain.

EXAM:
LEFT FOOT - COMPLETE 3+ VIEW

[foot ap]
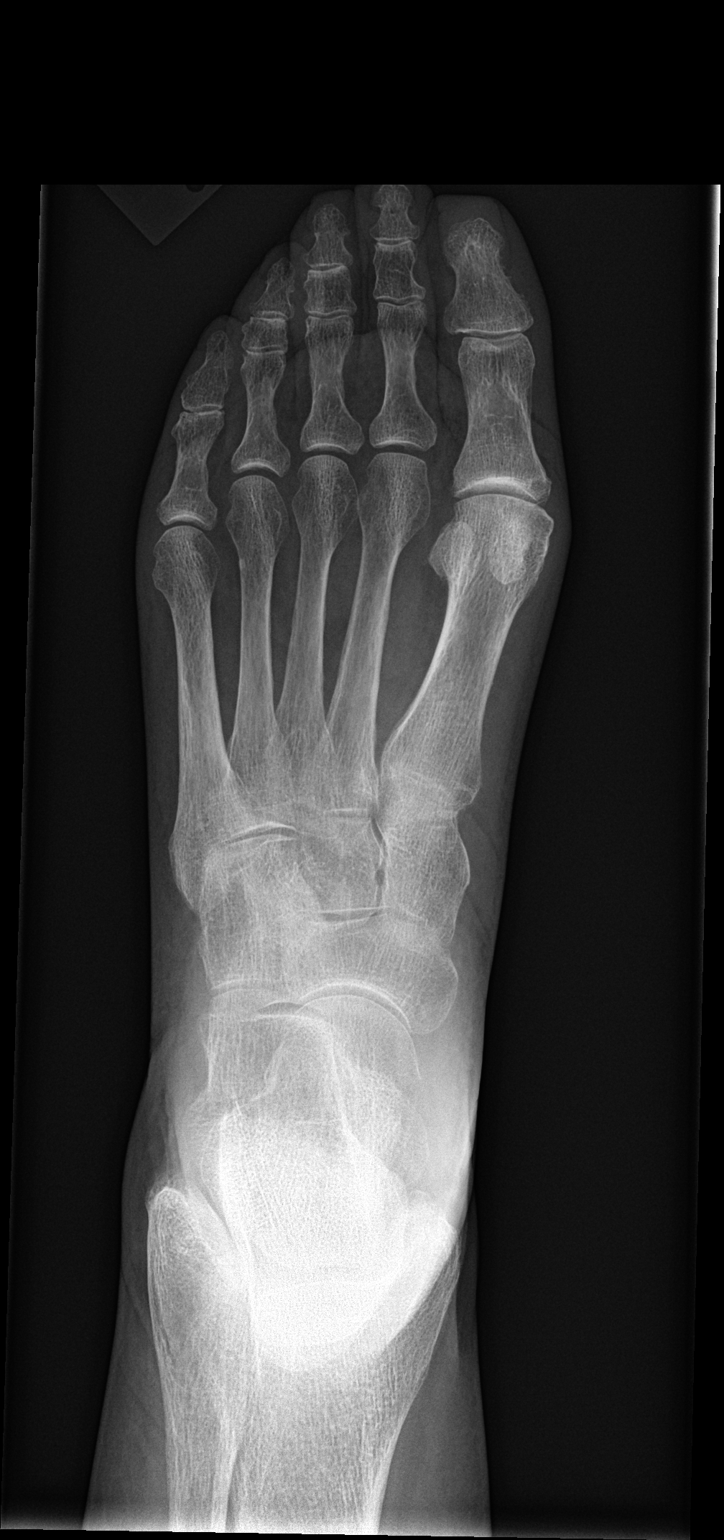

[foot obl]
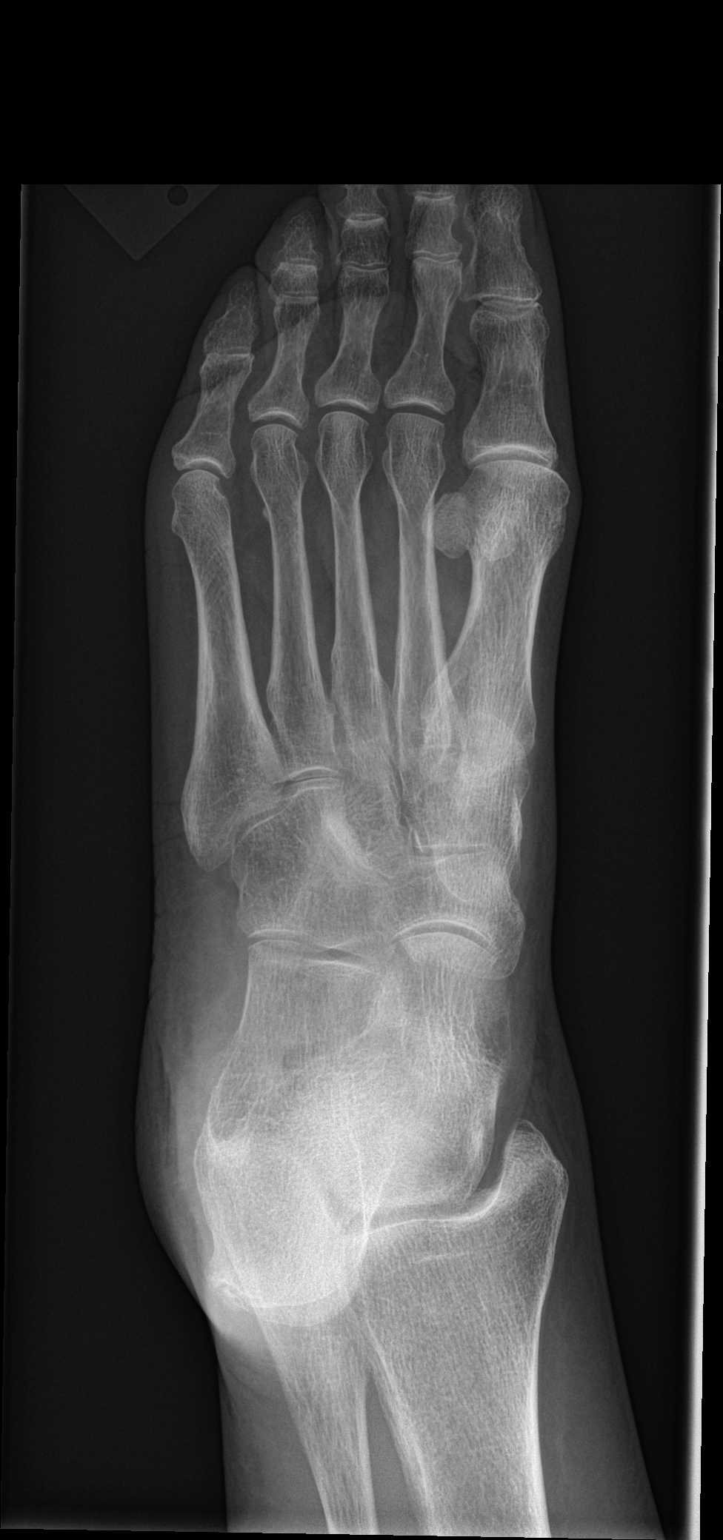

[foot lat]
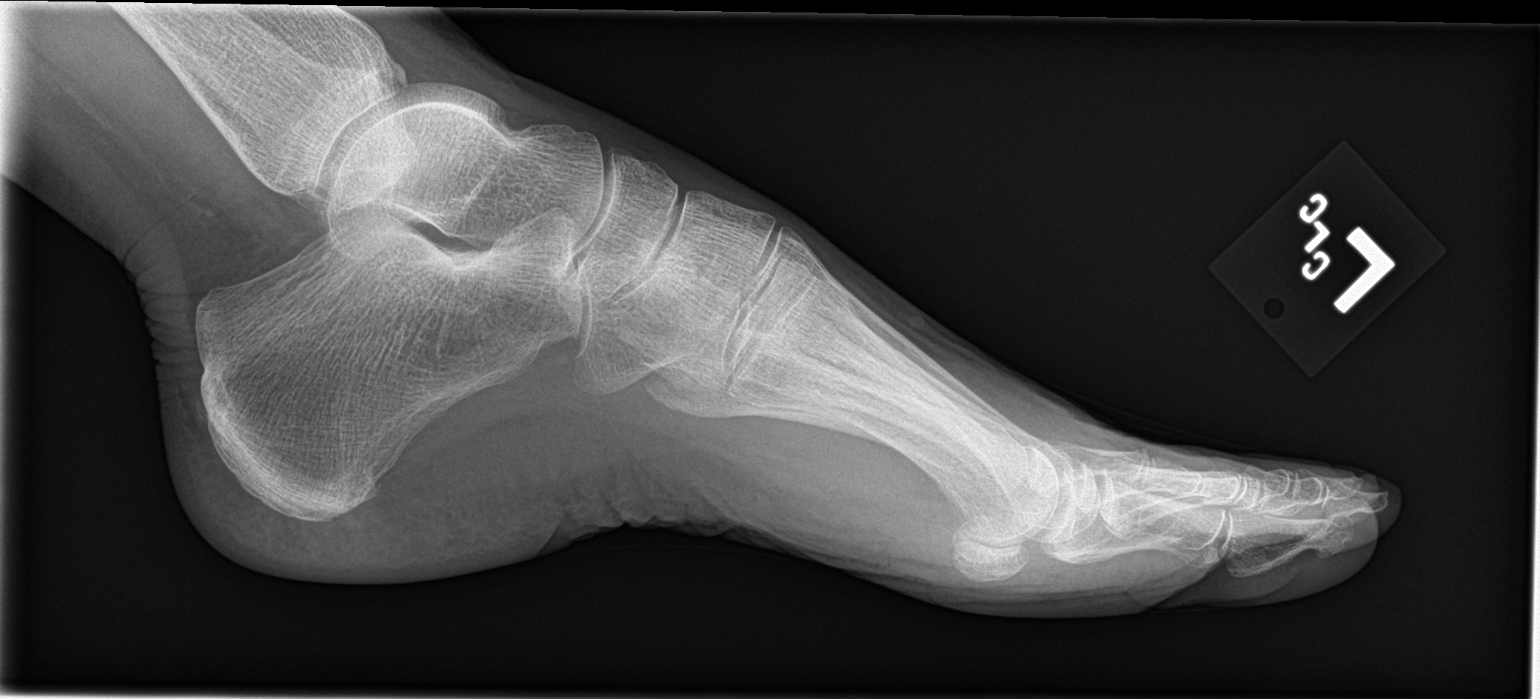

[3 of 3 positions shown; findings below may reference images not displayed]

FINDINGS: Minimal inferior calcaneal spur formation. Otherwise, unremarkable
bones and soft tissues.
IMPRESSION: Minimal inferior calcaneal spur formation.

## 2019-07-21 DIAGNOSIS — L218 Other seborrheic dermatitis: Secondary | ICD-10-CM | POA: Diagnosis not present

## 2019-07-21 DIAGNOSIS — L821 Other seborrheic keratosis: Secondary | ICD-10-CM | POA: Diagnosis not present

## 2019-07-21 DIAGNOSIS — L720 Epidermal cyst: Secondary | ICD-10-CM | POA: Diagnosis not present

## 2019-07-21 DIAGNOSIS — D1801 Hemangioma of skin and subcutaneous tissue: Secondary | ICD-10-CM | POA: Diagnosis not present

## 2019-07-21 DIAGNOSIS — D2261 Melanocytic nevi of right upper limb, including shoulder: Secondary | ICD-10-CM | POA: Diagnosis not present

## 2019-07-21 DIAGNOSIS — D225 Melanocytic nevi of trunk: Secondary | ICD-10-CM | POA: Diagnosis not present

## 2019-07-21 DIAGNOSIS — L814 Other melanin hyperpigmentation: Secondary | ICD-10-CM | POA: Diagnosis not present

## 2019-07-22 ENCOUNTER — Other Ambulatory Visit: Payer: Self-pay

## 2019-07-23 ENCOUNTER — Ambulatory Visit (INDEPENDENT_AMBULATORY_CARE_PROVIDER_SITE_OTHER): Payer: Medicare Other | Admitting: *Deleted

## 2019-07-23 DIAGNOSIS — E538 Deficiency of other specified B group vitamins: Secondary | ICD-10-CM

## 2019-07-23 NOTE — Progress Notes (Signed)
Pt given cyanocobalamin inj Tolerated well 

## 2019-07-30 ENCOUNTER — Other Ambulatory Visit: Payer: Self-pay

## 2019-07-30 ENCOUNTER — Ambulatory Visit (INDEPENDENT_AMBULATORY_CARE_PROVIDER_SITE_OTHER): Payer: Medicare Other | Admitting: Family Medicine

## 2019-07-30 ENCOUNTER — Encounter: Payer: Self-pay | Admitting: Family Medicine

## 2019-07-30 VITALS — BP 100/67 | HR 76 | Temp 97.3°F | Ht 64.0 in | Wt 122.6 lb

## 2019-07-30 DIAGNOSIS — F4323 Adjustment disorder with mixed anxiety and depressed mood: Secondary | ICD-10-CM

## 2019-07-30 DIAGNOSIS — E559 Vitamin D deficiency, unspecified: Secondary | ICD-10-CM

## 2019-07-30 DIAGNOSIS — E538 Deficiency of other specified B group vitamins: Secondary | ICD-10-CM | POA: Diagnosis not present

## 2019-07-30 DIAGNOSIS — E782 Mixed hyperlipidemia: Secondary | ICD-10-CM | POA: Diagnosis not present

## 2019-07-30 DIAGNOSIS — Z79891 Long term (current) use of opiate analgesic: Secondary | ICD-10-CM | POA: Diagnosis not present

## 2019-07-30 MED ORDER — PRAVASTATIN SODIUM 10 MG PO TABS: 5.0000 mg | ORAL_TABLET | ORAL | 3 refills | Status: DC

## 2019-07-30 MED ORDER — MIRABEGRON ER 25 MG PO TB24: 25.0000 mg | ORAL_TABLET | Freq: Every day | ORAL | 0 refills | Status: DC

## 2019-07-30 NOTE — Progress Notes (Signed)
BP 100/67   Pulse 76   Temp (!) 97.3 F (36.3 C) (Temporal)   Ht 5' 2" (1.575 m)   Wt 122 lb 9.6 oz (55.6 kg)   BMI 22.42 kg/m    Subjective:   Patient ID: Rose Martinez, female    DOB: Jan 22, 1935, 84 y.o.   MRN: [id number]  HPI: Rose Martinez is a 84 y.o. female presenting on 07/30/2019 for Annual Exam and Establish Care ([facility])   HPI Adult well exam and recheck of chronic issues Patient is coming in for a yearly exam and recheck of chronic issues including vitamin D deficiency and vitamin B12 deficiency and cholesterol.  She rarely uses her anxiety medication and mainly uses it once a month in the Xanax, patient does not need a refill today.  She was instructed that she will need to do refills during visits because of new policies and will need to do urine drug screen and contract once a year.  She is okay with this and will attempt to leave a urine today.  She does not use the medication every day so it will not be in her system. Patient denies any chest pain, shortness of breath, headaches or vision issues, abdominal complaints, diarrhea, nausea, vomiting.  She gets the occasional arthritic pains in her left hip and her right great toe and her right middle finger that are very short-lived and sporadic  Relevant past medical, surgical, family and social history reviewed and updated as indicated. Interim medical history since our last visit reviewed. Allergies and medications reviewed and updated.  Review of Systems  Constitutional: Negative for chills and fever.  HENT: Negative for congestion, ear discharge and ear pain.   Eyes: Negative for redness and visual disturbance.  Respiratory: Negative for chest tightness and shortness of breath.   Cardiovascular: Negative for chest pain and leg swelling.  Genitourinary: Negative for difficulty urinating and dysuria.  Musculoskeletal: Negative for back pain and gait problem.  Skin: Negative for rash.  Neurological: Negative for  light-headedness and headaches.  Psychiatric/Behavioral: Negative for agitation and behavioral problems.  All other systems reviewed and are negative.   Per HPI unless specifically indicated above   Allergies as of 07/30/2019      Reactions   Statins    myalgias   Cephalexin Other (See Comments)   Pt does not remember   Penicillins Other (See Comments)   Pt's mother was severly allergic, so she has never taken PCN.      Medication List       Accurate as of July 30, 2019 10:22 AM. If you have any questions, ask your nurse or doctor.        STOP taking these medications   Ciprodex OTIC suspension Generic drug: ciprofloxacin-dexamethasone Stopped by: Worthy Rancher, MD   rosuvastatin 5 MG tablet Commonly known as: Crestor Stopped by: Fransisca Kaufmann Dettinger, MD     TAKE these medications   ALIGN PO Take 1 capsule by mouth daily.   ALPRAZolam 0.25 MG tablet Commonly known as: XANAX TAKE (1/2) TABLET THREE TIMES DAILY.   cholecalciferol 1000 units tablet Commonly known as: VITAMIN D Take 2,000 Units by mouth daily.   fluticasone 50 MCG/ACT nasal spray Commonly known as: FLONASE SPRAY 1 SPRAY IN EACH NOSTRIL ONCE DAILY.   loratadine 10 MG tablet Commonly known as: CLARITIN Take 10 mg by mouth daily.   mirabegron ER 25 MG Tb24 tablet Commonly known as: Myrbetriq Take 1 tablet (25  mg total) by mouth daily.   montelukast 10 MG tablet Commonly known as: SINGULAIR Take one tablet once daily as directed   pravastatin 10 MG tablet Commonly known as: PRAVACHOL Take 0.5 tablets (5 mg total) by mouth every other day. Started by: Fransisca Kaufmann Dettinger, MD   propranolol 20 MG tablet Commonly known as: INDERAL TAKE (1) TABLET DAILY AS DIRECTED.   pseudoephedrine-guaifenesin 60-600 MG 12 hr tablet Commonly known as: MUCINEX D Take 1 table 1-2 times daily        Objective:   BP 100/67   Pulse 76   Temp (!) 97.3 F (36.3 C) (Temporal)   Ht 5' 2" (1.575 m)    Wt 122 lb 9.6 oz (55.6 kg)   BMI 22.42 kg/m   Wt Readings from Last 3 Encounters:  07/30/19 122 lb 9.6 oz (55.6 kg)  10/16/18 123 lb (55.8 kg)  10/03/18 124 lb 6.4 oz (56.4 kg)    Physical Exam Vitals signs and nursing note reviewed.  Constitutional:      General: She is not in acute distress.    Appearance: She is well-developed. She is not diaphoretic.  Eyes:     Conjunctiva/sclera: Conjunctivae normal.  Cardiovascular:     Rate and Rhythm: Normal rate and regular rhythm.     Heart sounds: Normal heart sounds. No murmur.  Pulmonary:     Effort: Pulmonary effort is normal. No respiratory distress.     Breath sounds: Normal breath sounds. No wheezing.  Abdominal:     General: Abdomen is flat. Bowel sounds are normal.     Palpations: Abdomen is soft.  Musculoskeletal: Normal range of motion.        General: No tenderness.  Skin:    General: Skin is warm and dry.     Findings: No rash.  Neurological:     Mental Status: She is alert and oriented to person, place, and time.     Coordination: Coordination normal.  Psychiatric:        Mood and Affect: Mood normal.        Behavior: Behavior normal.        Thought Content: Thought content normal.       Assessment & Plan:   Problem List Items Addressed This Visit      Other   Adjustment disorder with mixed anxiety and depressed mood   Relevant Orders   ToxASSURE Select 13 (MW), Urine   Vitamin D deficiency   Relevant Orders   VITAMIN D 25 Hydroxy (Vit-D Deficiency, Fractures)   Hyperlipidemia - Primary   Relevant Medications   pravastatin (PRAVACHOL) 10 MG tablet   Other Relevant Orders   CBC with Differential/Platelet   CMP14+EGFR   Lipid panel   B12 deficiency   Relevant Orders   Vitamin B12      Patient takes vitamin B12 injections and would like to continue with those, as long as her levels stay within normal range I am happy with that.  She has tried the Crestor and did not do well with that and said she  got achy and would like to try something different if possible.  Patient wakes up multiple times a night with overactive bladder and would like to try the Myrbetriq possibly again.  Follow up plan: Return in about 6 months (around 01/30/2020), or if symptoms worsen or fail to improve, for hld.  Counseling provided for all of the vaccine components Orders Placed This Encounter  Procedures  . CBC with Differential/Platelet  .  CMP14+EGFR  . Lipid panel  . VITAMIN D 25 Hydroxy (Vit-D Deficiency, Fractures)  . Vitamin B12    Caryl Pina, MD [facility] 07/30/2019, 10:22 AM

## 2019-07-31 DIAGNOSIS — Z961 Presence of intraocular lens: Secondary | ICD-10-CM | POA: Diagnosis not present

## 2019-07-31 LAB — MED LIST ATTACHED SEPARATELY

## 2019-08-03 LAB — TOXASSURE SELECT 13 (MW), URINE

## 2019-08-03 LAB — SPECIMEN STATUS REPORT

## 2019-08-06 ENCOUNTER — Other Ambulatory Visit: Payer: Medicare Other

## 2019-08-06 ENCOUNTER — Other Ambulatory Visit: Payer: Self-pay

## 2019-08-06 DIAGNOSIS — E559 Vitamin D deficiency, unspecified: Secondary | ICD-10-CM | POA: Diagnosis not present

## 2019-08-06 DIAGNOSIS — E782 Mixed hyperlipidemia: Secondary | ICD-10-CM

## 2019-08-06 DIAGNOSIS — E538 Deficiency of other specified B group vitamins: Secondary | ICD-10-CM

## 2019-08-08 LAB — CMP14+EGFR
ALT: 12 IU/L (ref 0–32)
AST: 12 IU/L (ref 0–40)
Albumin/Globulin Ratio: 2 (ref 1.2–2.2)
Albumin: 4.5 g/dL (ref 3.6–4.6)
Alkaline Phosphatase: 85 IU/L (ref 39–117)
BUN/Creatinine Ratio: 19 (ref 12–28)
BUN: 13 mg/dL (ref 8–27)
Bilirubin Total: 0.4 mg/dL (ref 0.0–1.2)
CO2: 22 mmol/L (ref 20–29)
Calcium: 9.3 mg/dL (ref 8.7–10.3)
Chloride: 105 mmol/L (ref 96–106)
Creatinine, Ser: 0.7 mg/dL (ref 0.57–1.00)
GFR calc Af Amer: 94 mL/min/{1.73_m2} (ref >59)
GFR calc non Af Amer: 82 mL/min/{1.73_m2} (ref >59)
Globulin, Total: 2.2 g/dL (ref 1.5–4.5)
Glucose: 92 mg/dL (ref 65–99)
Potassium: 4.5 mmol/L (ref 3.5–5.2)
Sodium: 146 mmol/L — ABNORMAL HIGH (ref 134–144)
Total Protein: 6.7 g/dL (ref 6.0–8.5)

## 2019-08-08 LAB — LIPID PANEL
Chol/HDL Ratio: 3.7 ratio (ref 0.0–4.4)
Cholesterol, Total: 224 mg/dL — ABNORMAL HIGH (ref 100–199)
HDL: 61 mg/dL (ref >39)
LDL Calculated: 146 mg/dL — ABNORMAL HIGH (ref 0–99)
Triglycerides: 87 mg/dL (ref 0–149)
VLDL Cholesterol Cal: 17 mg/dL (ref 5–40)

## 2019-08-08 LAB — CBC WITH DIFFERENTIAL/PLATELET
Basophils Absolute: 0.1 10*3/uL (ref 0.0–0.2)
Basos: 1 %
EOS (ABSOLUTE): 0.1 10*3/uL (ref 0.0–0.4)
Eos: 2 %
Hematocrit: 41.2 % (ref 34.0–46.6)
Hemoglobin: 13.9 g/dL (ref 11.1–15.9)
Immature Grans (Abs): 0 10*3/uL (ref 0.0–0.1)
Immature Granulocytes: 0 %
Lymphocytes Absolute: 1.7 10*3/uL (ref 0.7–3.1)
Lymphs: 36 %
MCH: 32.7 pg (ref 26.6–33.0)
MCHC: 33.7 g/dL (ref 31.5–35.7)
MCV: 97 fL (ref 79–97)
Monocytes Absolute: 0.4 10*3/uL (ref 0.1–0.9)
Monocytes: 9 %
Neutrophils Absolute: 2.5 10*3/uL (ref 1.4–7.0)
Neutrophils: 52 %
Platelets: 165 10*3/uL (ref 150–450)
RBC: 4.25 x10E6/uL (ref 3.77–5.28)
RDW: 11.3 % — ABNORMAL LOW (ref 11.7–15.4)
WBC: 4.8 10*3/uL (ref 3.4–10.8)

## 2019-08-08 LAB — VITAMIN B12: Vitamin B-12: 872 pg/mL (ref 232–1245)

## 2019-08-08 LAB — VITAMIN D 25 HYDROXY (VIT D DEFICIENCY, FRACTURES): Vit D, 25-Hydroxy: 59 ng/mL (ref 30.0–100.0)

## 2019-08-22 ENCOUNTER — Other Ambulatory Visit: Payer: Self-pay

## 2019-08-22 ENCOUNTER — Ambulatory Visit (INDEPENDENT_AMBULATORY_CARE_PROVIDER_SITE_OTHER): Payer: Medicare Other

## 2019-08-22 DIAGNOSIS — E538 Deficiency of other specified B group vitamins: Secondary | ICD-10-CM

## 2019-08-22 NOTE — Progress Notes (Signed)
Cyanocobalamin to left deltoid Patient tolerated well

## 2019-08-22 NOTE — Patient Instructions (Signed)

## 2019-08-29 DIAGNOSIS — H918X2 Other specified hearing loss, left ear: Secondary | ICD-10-CM | POA: Diagnosis not present

## 2019-08-29 DIAGNOSIS — H6092 Unspecified otitis externa, left ear: Secondary | ICD-10-CM | POA: Insufficient documentation

## 2019-08-29 DIAGNOSIS — H6062 Unspecified chronic otitis externa, left ear: Secondary | ICD-10-CM | POA: Diagnosis not present

## 2019-09-01 ENCOUNTER — Other Ambulatory Visit: Payer: Self-pay | Admitting: *Deleted

## 2019-09-01 MED ORDER — PROPRANOLOL HCL 20 MG PO TABS: 20.0000 mg | ORAL_TABLET | Freq: Every day | ORAL | 5 refills | Status: DC

## 2019-09-02 ENCOUNTER — Other Ambulatory Visit: Payer: Self-pay | Admitting: Family Medicine

## 2019-09-02 NOTE — Telephone Encounter (Signed)
All controlled substances have to be prescribed during the visit and Tylenol 3 is controlled so we will have to get her scheduled for a visit whenever she can and I would be happy to talk to her then.

## 2019-09-02 NOTE — Telephone Encounter (Signed)
You can schedule a phone visit for her and I will talk to her tomorrow

## 2019-09-02 NOTE — Telephone Encounter (Signed)
appt made and patient aware °

## 2019-09-02 NOTE — Telephone Encounter (Signed)
Patient states that she will be out of town tomorrow and will not be able to do televisit.  Patient state that Dr. Laurance Flatten prescribed Tylenol #3 in 2017 for 15 tablets.  Patient states that she gets stomach spasms and still has the 15 tablet and has not had the spasm since 2017.  She states that she has a "goody" box in case she has theses spasms.  Patient states that she only keeps this medication as needed.

## 2019-09-08 ENCOUNTER — Ambulatory Visit (INDEPENDENT_AMBULATORY_CARE_PROVIDER_SITE_OTHER): Payer: Medicare Other | Admitting: Family Medicine

## 2019-09-08 ENCOUNTER — Other Ambulatory Visit: Payer: Self-pay

## 2019-09-08 ENCOUNTER — Encounter: Payer: Self-pay | Admitting: Family Medicine

## 2019-09-08 VITALS — BP 121/74 | HR 69 | Temp 97.3°F | Resp 16 | Ht 63.0 in | Wt 124.6 lb

## 2019-09-08 DIAGNOSIS — R109 Unspecified abdominal pain: Secondary | ICD-10-CM | POA: Diagnosis not present

## 2019-09-08 DIAGNOSIS — E782 Mixed hyperlipidemia: Secondary | ICD-10-CM | POA: Diagnosis not present

## 2019-09-08 MED ORDER — PRAVASTATIN SODIUM 10 MG PO TABS: 10.0000 mg | ORAL_TABLET | ORAL | 3 refills | Status: DC

## 2019-09-08 MED ORDER — ACETAMINOPHEN-CODEINE #3 300-30 MG PO TABS: 1.0000 | ORAL_TABLET | ORAL | 0 refills | Status: DC | PRN

## 2019-09-08 NOTE — Progress Notes (Signed)
BP 121/74   Pulse 69   Temp (!) 97.3 F (36.3 C) (Temporal)   Resp 16   Ht 5\' 3"  (1.6 m)   Wt 124 lb 9.6 oz (56.5 kg)   SpO2 100%   BMI 22.07 kg/m    Subjective:   Patient ID: Rose Martinez, female    DOB: 06/05/1935, 84 y.o.   MRN: [id number]  HPI: Rose Martinez is a 84 y.o. female presenting on 09/08/2019 for Medication Refill (patient would like a rx for tylenal 3 for her stomach pain)   HPI Patient uses Tylenol 3 for muscle spasms and she has 15 that she has from 2 years ago that are expired and she just wants to renew it so that she can keep it on hand just in case she gets the muscle spasms.  She is not having them currently but she keeps them just in case.  She thinks she may have got muscle spasms due to an intolerance or allergy to a certain type of food and she has not had that at least a few years that she is doing a lot better.  Patient's cholesterol was elevated we talked about increasing insulin discussed this and she is willing to go ahead and increase it today.  She will start taking 10 mg 3 times a week  Relevant past medical, surgical, family and social history reviewed and updated as indicated. Interim medical history since our last visit reviewed. Allergies and medications reviewed and updated.  Review of Systems  Constitutional: Negative for chills and fever.  Eyes: Negative for visual disturbance.  Respiratory: Negative for chest tightness and shortness of breath.   Cardiovascular: Negative for chest pain and leg swelling.  Gastrointestinal: Negative for abdominal pain.  Musculoskeletal: Negative for back pain and gait problem.  Skin: Negative for rash.  Neurological: Negative for dizziness, light-headedness and headaches.  Psychiatric/Behavioral: Negative for agitation and behavioral problems.  All other systems reviewed and are negative.   Per HPI unless specifically indicated above   Allergies as of 09/08/2019      Reactions   Statins    myalgias   Cephalexin Other (See Comments)   Pt does not remember   Penicillins Other (See Comments)   Pt's mother was severly allergic, so she has never taken PCN.      Medication List       Accurate as of September 08, 2019 10:45 AM. If you have any questions, ask your nurse or doctor.        STOP taking these medications   mirabegron ER 25 MG Tb24 tablet Commonly known as: Myrbetriq Stopped by: Fransisca Kaufmann Dettinger, MD     TAKE these medications   acetaminophen-codeine 300-30 MG tablet Commonly known as: TYLENOL #3 Take 1 tablet by mouth as needed for moderate pain. Started by: Fransisca Kaufmann Dettinger, MD   ALIGN PO Take 1 capsule by mouth daily.   ALPRAZolam 0.25 MG tablet Commonly known as: XANAX TAKE (1/2) TABLET THREE TIMES DAILY.   cholecalciferol 1000 units tablet Commonly known as: VITAMIN D Take 2,000 Units by mouth daily.   fluticasone 50 MCG/ACT nasal spray Commonly known as: FLONASE SPRAY 1 SPRAY IN EACH NOSTRIL ONCE DAILY.   loratadine 10 MG tablet Commonly known as: CLARITIN Take 10 mg by mouth daily.   montelukast 10 MG tablet Commonly known as: SINGULAIR Take one tablet once daily as directed   pravastatin 10 MG tablet Commonly known as: PRAVACHOL Take 1  tablet (10 mg total) by mouth every other day. What changed: how much to take Changed by: Fransisca Kaufmann Dettinger, MD   propranolol 20 MG tablet Commonly known as: INDERAL Take 1 tablet (20 mg total) by mouth daily.   pseudoephedrine-guaifenesin 60-600 MG 12 hr tablet Commonly known as: MUCINEX D Take 1 table 1-2 times daily        Objective:   BP 121/74   Pulse 69   Temp (!) 97.3 F (36.3 C) (Temporal)   Resp 16   Ht 5\' 3"  (1.6 m)   Wt 124 lb 9.6 oz (56.5 kg)   SpO2 100%   BMI 22.07 kg/m   Wt Readings from Last 3 Encounters:  09/08/19 124 lb 9.6 oz (56.5 kg)  07/30/19 122 lb 9.6 oz (55.6 kg)  10/16/18 123 lb (55.8 kg)    Physical Exam Vitals signs and nursing note reviewed.   Constitutional:      General: She is not in acute distress.    Appearance: She is well-developed. She is not diaphoretic.  Eyes:     Conjunctiva/sclera: Conjunctivae normal.     Pupils: Pupils are equal, round, and reactive to light.  Cardiovascular:     Rate and Rhythm: Normal rate and regular rhythm.     Heart sounds: Normal heart sounds. No murmur.  Pulmonary:     Effort: Pulmonary effort is normal. No respiratory distress.     Breath sounds: Normal breath sounds. No wheezing.  Musculoskeletal: Normal range of motion.        General: No tenderness.  Skin:    General: Skin is warm and dry.     Findings: No rash.  Neurological:     Mental Status: She is alert and oriented to person, place, and time.     Coordination: Coordination normal.  Psychiatric:        Behavior: Behavior normal.       Assessment & Plan:   Problem List Items Addressed This Visit      Other   Hyperlipidemia - Primary   Abdominal spasms   Relevant Medications   acetaminophen-codeine (TYLENOL #3) 300-30 MG tablet    Gave refill for pravastatin and also refill for Tylenol 3, increase pravastatin to a whole tablet 3 times a week  Follow up plan: Return if symptoms worsen or fail to improve.  Counseling provided for all of the vaccine components No orders of the defined types were placed in this encounter.   Caryl Pina, MD [facility] 09/08/2019, 10:45 AM

## 2019-09-09 NOTE — Progress Notes (Signed)
HPI The patient presents for followup of palpitations.  He has these infrequently.  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  She has started taking a tiny dose of statin.  The patient denies any new symptoms such as chest discomfort, neck or arm discomfort. There has been no new shortness of breath, PND or orthopnea. There have been no reported palpitations, presyncope or syncope.    Allergies  Allergen Reactions  . Statins     myalgias  . Cephalexin Other (See Comments)    Pt does not remember   . Penicillins Other (See Comments)    Pt's mother was severly allergic, so she has never taken PCN.    Current Outpatient Medications  Medication Sig Dispense Refill  . ALPRAZolam (XANAX) 0.25 MG tablet TAKE (1/2) TABLET THREE TIMES DAILY. 45 tablet 1  . cholecalciferol (VITAMIN D) 1000 UNITS tablet Take 2,000 Units by mouth daily.     . fluticasone (FLONASE) 50 MCG/ACT nasal spray SPRAY 1 SPRAY IN EACH NOSTRIL ONCE DAILY. 16 g 11  . loratadine (CLARITIN) 10 MG tablet Take 10 mg by mouth daily.     . montelukast (SINGULAIR) 10 MG tablet Take one tablet once daily as directed 30 tablet 11  . pravastatin (PRAVACHOL) 10 MG tablet Take 1 tablet (10 mg total) by mouth every Monday, Wednesday, and Friday. 12 tablet 6  . Probiotic Product (ALIGN PO) Take 1 capsule by mouth daily.    . propranolol (INDERAL) 20 MG tablet Take 1 tablet (20 mg total) by mouth daily. (Patient taking differently: Take 20 mg by mouth daily as needed. ) 30 tablet 5  . pseudoephedrine-guaifenesin (MUCINEX D) 60-600 MG 12 hr tablet Take 1 table 1-2 times daily 30 tablet 11  . acetaminophen-codeine (TYLENOL #3) 300-30 MG tablet Take 1 tablet by mouth as needed for moderate pain. (Patient not taking: Reported on 09/10/2019) 15 tablet 0   Current Facility-Administered Medications  Medication Dose Route Frequency Provider Last Rate Last Dose  . cyanocobalamin ((VITAMIN B-12)) injection 1,000 mcg  1,000 mcg  Intramuscular Q30 days Chipper Herb, MD   1,000 mcg at 08/22/19 1516    Past Medical History:  Diagnosis Date  . Anxiety   . Hyperlipidemia    Mild  . Osteoporosis   . Palpitations     Past Surgical History:  Procedure Laterality Date  . CYSTECTOMY     Breast  . EYE SURGERY    . OOPHORECTOMY      ROS:    As stated in the HPI and negative for all other systems.   PHYSICAL EXAM BP 110/70   Pulse (!) 58   Ht 5\' 3"  (1.6 m)   Wt 124 lb (56.2 kg)   BMI 21.97 kg/m   GENERAL:  Well appearing NECK:  No jugular venous distention, waveform within normal limits, carotid upstroke brisk and symmetric, no bruits, no thyromegaly LUNGS:  Clear to auscultation bilaterally CHEST:  Unremarkable HEART:  PMI not displaced or sustained,S1 and S2 within normal limits, no S3, no S4, no clicks, no rubs, no murmurs ABD:  Flat, positive bowel sounds normal in frequency in pitch, no bruits, no rebound, no guarding, no midline pulsatile mass, no hepatomegaly, no splenomegaly EXT:  2 plus pulses throughout, no edema, no cyanosis no clubbing   EKG: Sinus rhythm, rate 58 , axis within normal limits, intervals within normal limits ST-T wave changes.  ASSESSMENT AND PLAN  PALPITATIONS:  She tolerates these.  No  change in therapy.   DYSLIPIDEMIA:  I have suggested that she creep up on her Pravachol.  She will go to 10 mg three x per week.

## 2019-09-10 ENCOUNTER — Ambulatory Visit (INDEPENDENT_AMBULATORY_CARE_PROVIDER_SITE_OTHER): Payer: Medicare Other | Admitting: Cardiology

## 2019-09-10 ENCOUNTER — Encounter: Payer: Self-pay | Admitting: Cardiology

## 2019-09-10 ENCOUNTER — Other Ambulatory Visit: Payer: Self-pay

## 2019-09-10 VITALS — BP 110/70 | HR 58 | Ht 63.0 in | Wt 124.0 lb

## 2019-09-10 DIAGNOSIS — R002 Palpitations: Secondary | ICD-10-CM | POA: Diagnosis not present

## 2019-09-10 DIAGNOSIS — E782 Mixed hyperlipidemia: Secondary | ICD-10-CM | POA: Diagnosis not present

## 2019-09-10 MED ORDER — PRAVASTATIN SODIUM 10 MG PO TABS: 10.0000 mg | ORAL_TABLET | ORAL | 6 refills | Status: DC

## 2019-09-10 NOTE — Patient Instructions (Signed)
Medication Instructions:  Please take Pravastatin 10 mg every Monday, Wednesday and Friday. Continue all other medications as listed.  If you need a refill on your cardiac medications before your next appointment, please call your pharmacy.   Follow-Up: Follow up in 6 months with Dr. Percival Spanish.  You will receive a letter in the mail 2 months before you are due.  Please call us when you receive this letter to schedule your follow up appointment.  Thank you for choosing [facility]!!

## 2019-09-11 ENCOUNTER — Other Ambulatory Visit: Payer: Self-pay

## 2019-09-12 ENCOUNTER — Ambulatory Visit: Payer: Medicare Other

## 2019-09-12 ENCOUNTER — Ambulatory Visit (INDEPENDENT_AMBULATORY_CARE_PROVIDER_SITE_OTHER): Payer: Medicare Other

## 2019-09-12 DIAGNOSIS — E538 Deficiency of other specified B group vitamins: Secondary | ICD-10-CM | POA: Diagnosis not present

## 2019-09-12 NOTE — Progress Notes (Signed)
Cyanocobalamin injection given.  Patient tolerated well. 

## 2019-10-03 ENCOUNTER — Telehealth: Payer: Self-pay | Admitting: Family Medicine

## 2019-10-03 NOTE — Telephone Encounter (Signed)
Note left on provider's desk to call patient.  She refused to give me any details.

## 2019-10-06 ENCOUNTER — Telehealth: Payer: Self-pay | Admitting: Family Medicine

## 2019-10-06 NOTE — Telephone Encounter (Signed)
Call patient once you find out if she is actually needing pna vaccine, she thinks she already had one. Caryl Pina, MD [facility] 10/06/2019, 5:25 PM

## 2019-10-07 NOTE — Telephone Encounter (Signed)
Attempted to contact patient - NVM  Per paper chart patient had a PNA 23 12/2009  She is due for a PNA.

## 2019-10-10 ENCOUNTER — Ambulatory Visit (INDEPENDENT_AMBULATORY_CARE_PROVIDER_SITE_OTHER): Payer: Medicare Other

## 2019-10-10 ENCOUNTER — Other Ambulatory Visit: Payer: Self-pay

## 2019-10-10 DIAGNOSIS — E538 Deficiency of other specified B group vitamins: Secondary | ICD-10-CM | POA: Diagnosis not present

## 2019-10-10 NOTE — Progress Notes (Signed)
Cyanocobalamin injection given to left deltoid.  Patient tolerated well. 

## 2019-11-06 ENCOUNTER — Other Ambulatory Visit: Payer: Self-pay

## 2019-11-07 ENCOUNTER — Ambulatory Visit (INDEPENDENT_AMBULATORY_CARE_PROVIDER_SITE_OTHER): Payer: Medicare Other

## 2019-11-07 DIAGNOSIS — E538 Deficiency of other specified B group vitamins: Secondary | ICD-10-CM

## 2019-11-07 NOTE — Progress Notes (Signed)
Cyanocobalamin injection given to right deltoid.  Patient tolerated well. 

## 2019-11-28 ENCOUNTER — Other Ambulatory Visit: Payer: Self-pay | Admitting: Allergy and Immunology

## 2019-12-05 ENCOUNTER — Ambulatory Visit (INDEPENDENT_AMBULATORY_CARE_PROVIDER_SITE_OTHER): Payer: Medicare Other

## 2019-12-05 ENCOUNTER — Other Ambulatory Visit: Payer: Self-pay

## 2019-12-05 DIAGNOSIS — E538 Deficiency of other specified B group vitamins: Secondary | ICD-10-CM | POA: Diagnosis not present

## 2019-12-05 NOTE — Progress Notes (Signed)
Cyanocobalamin injection given to left deltoid.  Patient tolerated well. 

## 2020-01-02 ENCOUNTER — Other Ambulatory Visit: Payer: Self-pay

## 2020-01-02 ENCOUNTER — Other Ambulatory Visit: Payer: Self-pay | Admitting: Allergy and Immunology

## 2020-01-02 ENCOUNTER — Telehealth: Payer: Self-pay | Admitting: Allergy and Immunology

## 2020-01-02 NOTE — Telephone Encounter (Signed)
Pt called and needs refill on her mucinex-d called into  [facility] //[phone].

## 2020-01-02 NOTE — Telephone Encounter (Signed)
Please do not call any more medications in for this patient until she comes for an OV. Thank you

## 2020-01-02 NOTE — Telephone Encounter (Signed)
Tried calling pt she did not answer and did not have a answering machine. Pt hasnt been seen since 2019 so will need a office visit before next refill is given.

## 2020-01-05 NOTE — Telephone Encounter (Signed)
Patient declined appointment at this time. She says that she is doing fine and does not need an appointment at this time. She only needs the Mucinex-D refilled and does not need to be seen. I let her know that she would need to be seen in the office or have a telephone visit before we could send in anything. She asked me if the generic was the same thing as the OTC. I let her know that it is the same thing as Mucinex-D. She said that she would just get it that way. I told her to remember that in the future she would have to be seen in the office or via telephone visit prior to receiving any further refills. Patient did verbalize understanding and will set up an appointment in the future.

## 2020-01-05 NOTE — Telephone Encounter (Signed)
I spoke with the patient and she did not understand why she could not get the refills she needed. I explained that in the last office note he had wanted her to follow up in 1 year. I let her know that it is policy. I offered her a telephone visit in the event that she did not want to come to the office. She asked that I call her back around [phone] to schedule the appointment on her home number.

## 2020-01-05 NOTE — Telephone Encounter (Signed)
Patient called stating that she would like to talk to Dr. Neldon Mc directly. Patient states that she does not need to come into the office since everything is the same and she is not having any issues. Patient states last time Dr. Neldon Mc was able to prescribe the medication without being seen.   Patient would like Dr Neldon Mc to call her first thing in the morning if possible. Patient states she gets up around 6:30am.   Patient would like to be called on her home phone, but if she does not answer please try her mobile.   Please advise.

## 2020-01-08 ENCOUNTER — Other Ambulatory Visit: Payer: Self-pay

## 2020-01-09 ENCOUNTER — Ambulatory Visit (INDEPENDENT_AMBULATORY_CARE_PROVIDER_SITE_OTHER): Payer: Medicare Other | Admitting: *Deleted

## 2020-01-09 ENCOUNTER — Other Ambulatory Visit: Payer: Self-pay

## 2020-01-09 DIAGNOSIS — E538 Deficiency of other specified B group vitamins: Secondary | ICD-10-CM | POA: Diagnosis not present

## 2020-02-03 ENCOUNTER — Other Ambulatory Visit: Payer: Self-pay

## 2020-02-04 ENCOUNTER — Encounter: Payer: Self-pay | Admitting: Family Medicine

## 2020-02-04 ENCOUNTER — Ambulatory Visit (INDEPENDENT_AMBULATORY_CARE_PROVIDER_SITE_OTHER): Payer: Medicare Other | Admitting: Family Medicine

## 2020-02-04 VITALS — BP 110/64 | HR 70 | Temp 98.0°F | Ht 63.0 in | Wt 121.8 lb

## 2020-02-04 DIAGNOSIS — E782 Mixed hyperlipidemia: Secondary | ICD-10-CM | POA: Diagnosis not present

## 2020-02-04 DIAGNOSIS — D696 Thrombocytopenia, unspecified: Secondary | ICD-10-CM

## 2020-02-04 DIAGNOSIS — E559 Vitamin D deficiency, unspecified: Secondary | ICD-10-CM

## 2020-02-04 DIAGNOSIS — E538 Deficiency of other specified B group vitamins: Secondary | ICD-10-CM

## 2020-02-04 DIAGNOSIS — Z23 Encounter for immunization: Secondary | ICD-10-CM

## 2020-02-04 DIAGNOSIS — G629 Polyneuropathy, unspecified: Secondary | ICD-10-CM

## 2020-02-04 NOTE — Addendum Note (Signed)
Addended by: Karle Plumber on: 02/04/2020 09:50 AM   Modules accepted: Orders

## 2020-02-04 NOTE — Progress Notes (Signed)
BP 110/64   Pulse 70   Temp 98 F (36.7 C) (Temporal)   Ht 5' 3"  (1.6 m)   Wt 121 lb 12.8 oz (55.2 kg)   SpO2 100%   BMI 21.58 kg/m    Subjective:   Patient ID: Rose Martinez, female    DOB: 02-Apr-1935, 85 y.o.   MRN: [id number]  HPI: Rose Martinez is a 85 y.o. female presenting on 02/04/2020 for Hyperlipidemia (6 month follow up)   HPI Hyperlipidemia Patient is coming in for recheck of his hyperlipidemia. The patient is currently taking pravastatin every other day. They deny any issues with myalgias or history of liver damage from it. They deny any focal numbness or weakness or chest pain.   Patient is coming in for recheck of vitamin B12 and vitamin D deficiency.  She does have some tingling that is intermittent in both of her big toes and numbness on the bottom of her foot that is intermittent as well.  She says it feels like it swollen but is not actually swollen.  She is due for recheck of B12 and vitamin D, patient is also due for recheck of her blood count, she has a history of thrombocytopenia  Relevant past medical, surgical, family and social history reviewed and updated as indicated. Interim medical history since our last visit reviewed. Allergies and medications reviewed and updated.  Review of Systems  Constitutional: Negative for chills and fever.  Eyes: Negative for visual disturbance.  Respiratory: Negative for chest tightness and shortness of breath.   Cardiovascular: Negative for chest pain and leg swelling.  Musculoskeletal: Negative for back pain and gait problem.  Skin: Negative for rash.  Neurological: Positive for numbness. Negative for weakness, light-headedness and headaches.  Psychiatric/Behavioral: Negative for agitation and behavioral problems.  All other systems reviewed and are negative.   Per HPI unless specifically indicated above   Allergies as of 02/04/2020      Reactions   Statins    myalgias   Cephalexin Other (See Comments)   Pt  does not remember   Penicillins Other (See Comments)   Pt's mother was severly allergic, so she has never taken PCN.      Medication List       Accurate as of February 04, 2020  8:59 AM. If you have any questions, ask your nurse or doctor.        STOP taking these medications   propranolol 20 MG tablet Commonly known as: INDERAL Stopped by: Fransisca Kaufmann Ellias Mcelreath, MD     TAKE these medications   acetaminophen-codeine 300-30 MG tablet Commonly known as: TYLENOL #3 Take 1 tablet by mouth as needed for moderate pain.   ALIGN PO Take 1 capsule by mouth daily.   ALPRAZolam 0.25 MG tablet Commonly known as: XANAX TAKE (1/2) TABLET THREE TIMES DAILY.   cholecalciferol 1000 units tablet Commonly known as: VITAMIN D Take 2,000 Units by mouth daily.   fluticasone 50 MCG/ACT nasal spray Commonly known as: FLONASE SPRAY 1 SPRAY IN EACH NOSTRIL ONCE DAILY.   loratadine 10 MG tablet Commonly known as: CLARITIN Take 10 mg by mouth daily.   montelukast 10 MG tablet Commonly known as: SINGULAIR TAKE (1) TABLET DAILY AS DIRECTED.   pravastatin 10 MG tablet Commonly known as: PRAVACHOL Take 1 tablet (10 mg total) by mouth every Monday, Wednesday, and Friday.   pseudoephedrine-guaifenesin 60-600 MG 12 hr tablet Commonly known as: MUCINEX D Take 1 table 1-2 times daily  Objective:   BP 110/64   Pulse 70   Temp 98 F (36.7 C) (Temporal)   Ht 5' 3"  (1.6 m)   Wt 121 lb 12.8 oz (55.2 kg)   SpO2 100%   BMI 21.58 kg/m   Wt Readings from Last 3 Encounters:  02/04/20 121 lb 12.8 oz (55.2 kg)  09/10/19 124 lb (56.2 kg)  09/08/19 124 lb 9.6 oz (56.5 kg)    Physical Exam Vitals and nursing note reviewed.  Constitutional:      General: She is not in acute distress.    Appearance: She is well-developed. She is not diaphoretic.  Eyes:     Conjunctiva/sclera: Conjunctivae normal.  Cardiovascular:     Rate and Rhythm: Normal rate and regular rhythm.     Heart sounds:  Normal heart sounds. No murmur.  Pulmonary:     Effort: Pulmonary effort is normal. No respiratory distress.     Breath sounds: Normal breath sounds. No wheezing.  Abdominal:     General: Abdomen is flat. Bowel sounds are normal. There is no distension.     Tenderness: There is no abdominal tenderness. There is no right CVA tenderness or left CVA tenderness.  Musculoskeletal:        General: No tenderness. Normal range of motion.  Skin:    General: Skin is warm and dry.     Findings: No rash.  Neurological:     Mental Status: She is alert and oriented to person, place, and time.     Coordination: Coordination normal.  Psychiatric:        Behavior: Behavior normal.       Assessment & Plan:   Problem List Items Addressed This Visit      Other   Vitamin D deficiency - Primary   Relevant Orders   VITAMIN D 25 Hydroxy (Vit-D Deficiency, Fractures)   Hyperlipidemia   Relevant Orders   Lipid panel   Thrombocytopenia ([place])   B12 deficiency   Relevant Orders   Vitamin B12    Other Visit Diagnoses    Neuropathy       Relevant Orders   CBC with Differential/Platelet   CMP14+EGFR   TSH   Vitamin B12      Patient has a tingling burning sensation both of her feet etc. written, will check B12 and thyroid along with the rest of her blood work.  Patient is currently getting B12 shots Follow up plan: Return in about 6 months (around 08/03/2020), or if symptoms worsen or fail to improve, for Hyperlipidemia recheck.  Counseling provided for all of the vaccine components Orders Placed This Encounter  Procedures  . CBC with Differential/Platelet  . CMP14+EGFR  . Lipid panel  . TSH  . Vitamin B12  . VITAMIN D 25 Hydroxy (Vit-D Deficiency, Fractures)    Caryl Pina, MD [facility] 02/04/2020, 8:59 AM

## 2020-02-05 LAB — CBC WITH DIFFERENTIAL/PLATELET
Basophils Absolute: 0 10*3/uL (ref 0.0–0.2)
Basos: 1 %
EOS (ABSOLUTE): 0.1 10*3/uL (ref 0.0–0.4)
Eos: 2 %
Hematocrit: 37.5 % (ref 34.0–46.6)
Hemoglobin: 12.6 g/dL (ref 11.1–15.9)
Immature Grans (Abs): 0 10*3/uL (ref 0.0–0.1)
Immature Granulocytes: 0 %
Lymphocytes Absolute: 1.5 10*3/uL (ref 0.7–3.1)
Lymphs: 31 %
MCH: 33.2 pg — ABNORMAL HIGH (ref 26.6–33.0)
MCHC: 33.6 g/dL (ref 31.5–35.7)
MCV: 99 fL — ABNORMAL HIGH (ref 79–97)
Monocytes Absolute: 0.5 10*3/uL (ref 0.1–0.9)
Monocytes: 10 %
Neutrophils Absolute: 2.8 10*3/uL (ref 1.4–7.0)
Neutrophils: 56 %
Platelets: 196 10*3/uL (ref 150–450)
RBC: 3.8 x10E6/uL (ref 3.77–5.28)
RDW: 11.8 % (ref 11.7–15.4)
WBC: 4.8 10*3/uL (ref 3.4–10.8)

## 2020-02-05 LAB — CMP14+EGFR
ALT: 13 IU/L (ref 0–32)
AST: 14 IU/L (ref 0–40)
Albumin/Globulin Ratio: 1.9 (ref 1.2–2.2)
Albumin: 4.2 g/dL (ref 3.6–4.6)
Alkaline Phosphatase: 86 IU/L (ref 39–117)
BUN/Creatinine Ratio: 21 (ref 12–28)
BUN: 14 mg/dL (ref 8–27)
Bilirubin Total: 0.4 mg/dL (ref 0.0–1.2)
CO2: 25 mmol/L (ref 20–29)
Calcium: 8.8 mg/dL (ref 8.7–10.3)
Chloride: 104 mmol/L (ref 96–106)
Creatinine, Ser: 0.67 mg/dL (ref 0.57–1.00)
GFR calc Af Amer: 95 mL/min/{1.73_m2} (ref >59)
GFR calc non Af Amer: 82 mL/min/{1.73_m2} (ref >59)
Globulin, Total: 2.2 g/dL (ref 1.5–4.5)
Glucose: 87 mg/dL (ref 65–99)
Potassium: 4.1 mmol/L (ref 3.5–5.2)
Sodium: 141 mmol/L (ref 134–144)
Total Protein: 6.4 g/dL (ref 6.0–8.5)

## 2020-02-05 LAB — LIPID PANEL
Chol/HDL Ratio: 2.8 ratio (ref 0.0–4.4)
Cholesterol, Total: 178 mg/dL (ref 100–199)
HDL: 63 mg/dL (ref >39)
LDL Chol Calc (NIH): 102 mg/dL — ABNORMAL HIGH (ref 0–99)
Triglycerides: 68 mg/dL (ref 0–149)
VLDL Cholesterol Cal: 13 mg/dL (ref 5–40)

## 2020-02-05 LAB — VITAMIN B12: Vitamin B-12: 838 pg/mL (ref 232–1245)

## 2020-02-05 LAB — VITAMIN D 25 HYDROXY (VIT D DEFICIENCY, FRACTURES): Vit D, 25-Hydroxy: 53.2 ng/mL (ref 30.0–100.0)

## 2020-02-05 LAB — TSH: TSH: 1.72 u[IU]/mL (ref 0.450–4.500)

## 2020-02-06 ENCOUNTER — Other Ambulatory Visit: Payer: Self-pay

## 2020-02-09 ENCOUNTER — Ambulatory Visit (INDEPENDENT_AMBULATORY_CARE_PROVIDER_SITE_OTHER): Payer: Medicare Other

## 2020-02-09 ENCOUNTER — Other Ambulatory Visit: Payer: Self-pay

## 2020-02-09 DIAGNOSIS — E538 Deficiency of other specified B group vitamins: Secondary | ICD-10-CM

## 2020-02-09 NOTE — Progress Notes (Signed)
Cyanocobalamin injection given.  Patient tolerated well. 

## 2020-02-23 ENCOUNTER — Other Ambulatory Visit: Payer: Self-pay | Admitting: Family Medicine

## 2020-02-23 NOTE — Telephone Encounter (Signed)
Pt was seen 2 weeks ago for complete check up - this wasn't filled? Tox on file.  Please address

## 2020-02-23 NOTE — Telephone Encounter (Signed)
Last office visit 02/04/2020 Last refill 05/16/2019, #45 one refill

## 2020-03-09 ENCOUNTER — Ambulatory Visit (INDEPENDENT_AMBULATORY_CARE_PROVIDER_SITE_OTHER): Payer: Medicare Other | Admitting: *Deleted

## 2020-03-09 ENCOUNTER — Other Ambulatory Visit: Payer: Self-pay

## 2020-03-09 DIAGNOSIS — E538 Deficiency of other specified B group vitamins: Secondary | ICD-10-CM | POA: Diagnosis not present

## 2020-03-09 MED ORDER — CYANOCOBALAMIN 1000 MCG/ML IJ SOLN
1000.0000 ug | INTRAMUSCULAR | Status: AC
  Administered 2020-03-09 – 2021-02-16 (×11): 1000 ug via INTRAMUSCULAR

## 2020-03-09 NOTE — Patient Instructions (Signed)

## 2020-03-09 NOTE — Progress Notes (Signed)
Pt given B12 injection IM right deltoid and tolerated well. 

## 2020-03-31 ENCOUNTER — Ambulatory Visit (INDEPENDENT_AMBULATORY_CARE_PROVIDER_SITE_OTHER): Payer: Medicare Other | Admitting: Family Medicine

## 2020-03-31 ENCOUNTER — Encounter: Payer: Self-pay | Admitting: Family Medicine

## 2020-03-31 DIAGNOSIS — S161XXA Strain of muscle, fascia and tendon at neck level, initial encounter: Secondary | ICD-10-CM | POA: Diagnosis not present

## 2020-03-31 NOTE — Progress Notes (Signed)
Virtual Visit via telephone Note  I connected with Rose Martinez on 03/31/20 at 1133 by telephone and verified that I am speaking with the correct person using two identifiers. Rose Martinez is currently located at home and no other people are currently with her during visit. The provider, Fransisca Kaufmann Kharma Sampsel, MD is located in their office at time of visit.  Call ended at 1143  I discussed the limitations, risks, security and privacy concerns of performing an evaluation and management service by telephone and the availability of in person appointments. I also discussed with the patient that there may be a patient responsible charge related to this service. The patient expressed understanding and agreed to proceed.   History and Present Illness: Patient is having issues with her neck and is having pain for about a week.  It is on the left side of her neck and she tried stopping the cholesterol pill and her neck is improved.  She cannot recall doing something traumatic to it.  She has been picking up potting soil.  She denies any numbness or weakness  No diagnosis found.  Outpatient Encounter Medications as of 03/31/2020  Medication Sig  . acetaminophen-codeine (TYLENOL #3) 300-30 MG tablet Take 1 tablet by mouth as needed for moderate pain.  Marland Kitchen ALPRAZolam (XANAX) 0.25 MG tablet TAKE (1/2) TABLET UP TO THREE TIMES DAILY.  . cholecalciferol (VITAMIN D) 1000 UNITS tablet Take 2,000 Units by mouth daily.   . fluticasone (FLONASE) 50 MCG/ACT nasal spray SPRAY 1 SPRAY IN EACH NOSTRIL ONCE DAILY.  Marland Kitchen loratadine (CLARITIN) 10 MG tablet Take 10 mg by mouth daily.   . montelukast (SINGULAIR) 10 MG tablet TAKE (1) TABLET DAILY AS DIRECTED.  Marland Kitchen pravastatin (PRAVACHOL) 10 MG tablet Take 1 tablet (10 mg total) by mouth every Monday, Wednesday, and Friday.  . Probiotic Product (ALIGN PO) Take 1 capsule by mouth daily.  . pseudoephedrine-guaifenesin (MUCINEX D) 60-600 MG 12 hr tablet Take 1 table 1-2 times  daily   Facility-Administered Encounter Medications as of 03/31/2020  Medication  . cyanocobalamin ((VITAMIN B-12)) injection 1,000 mcg    Review of Systems  Constitutional: Negative for chills and fever.  Eyes: Negative for visual disturbance.  Respiratory: Negative for chest tightness and shortness of breath.   Cardiovascular: Negative for chest pain and leg swelling.  Musculoskeletal: Positive for neck pain. Negative for arthralgias, back pain and gait problem.  Skin: Negative for rash.  Neurological: Negative for light-headedness and headaches.  Psychiatric/Behavioral: Negative for agitation and behavioral problems.  All other systems reviewed and are negative.   Observations/Objective: Patient sounds comfortable and in no acute distress  Assessment and Plan: Problem List Items Addressed This Visit    None    Visit Diagnoses    Strain of neck muscle, initial encounter    -  Primary      Will try conservative management with advil and tylenol and will do heat and stretching. Follow up plan: Return if symptoms worsen or fail to improve.     I discussed the assessment and treatment plan with the patient. The patient was provided an opportunity to ask questions and all were answered. The patient agreed with the plan and demonstrated an understanding of the instructions.   The patient was advised to call back or seek an in-person evaluation if the symptoms worsen or if the condition fails to improve as anticipated.  The above assessment and management plan was discussed with the patient. The patient verbalized understanding  of and has agreed to the management plan. Patient is aware to call the clinic if symptoms persist or worsen. Patient is aware when to return to the clinic for a follow-up visit. Patient educated on when it is appropriate to go to the emergency department.    I provided 10 minutes of non-face-to-face time during this encounter.    Worthy Rancher,  MD

## 2020-04-06 NOTE — Progress Notes (Signed)
Cardiology Office Note   Date:  04/07/2020   ID:  Rose Martinez, DOB 12/20/1934, MRN [id number]  PCP:  Dettinger, Fransisca Kaufmann, MD  Cardiologist:   No primary care provider on file.   Chief Complaint  Patient presents with  . Palpitations      History of Present Illness: Rose Martinez is a 85 y.o. female who presents for followup of palpitations.  Since I last saw her she has had no new complaints she she did start low-dose pravastatin with good result on her cholesterol but she had some cramping in her neck so she held it for a while.  She just restarted back.  She has had occasional palpitations but otherwise no change in symptoms.  These are not particularly symptomatic.  She had no presyncope or syncope.  She denies any chest pain or shortness of breath.  Past Medical History:  Diagnosis Date  . Anxiety   . Hyperlipidemia    Mild  . Osteoporosis   . Palpitations     Past Surgical History:  Procedure Laterality Date  . CYSTECTOMY     Breast  . EYE SURGERY    . OOPHORECTOMY       Current Outpatient Medications  Medication Sig Dispense Refill  . ALPRAZolam (XANAX) 0.25 MG tablet TAKE (1/2) TABLET UP TO THREE TIMES DAILY. 45 tablet 0  . cholecalciferol (VITAMIN D) 1000 UNITS tablet Take 2,000 Units by mouth daily.     Marland Kitchen loratadine (CLARITIN) 10 MG tablet Take 10 mg by mouth daily.     . montelukast (SINGULAIR) 10 MG tablet TAKE (1) TABLET DAILY AS DIRECTED. 30 tablet 0  . pravastatin (PRAVACHOL) 10 MG tablet Take 1 tablet (10 mg total) by mouth every Monday, Wednesday, and Friday. 12 tablet 6  . Probiotic Product (ALIGN PO) Take 1 capsule by mouth daily.    . pseudoephedrine-guaifenesin (MUCINEX D) 60-600 MG 12 hr tablet Take 1 table 1-2 times daily 30 tablet 11  . fluticasone (FLONASE) 50 MCG/ACT nasal spray SPRAY 1 SPRAY IN EACH NOSTRIL ONCE DAILY. (Patient not taking: Reported on 04/07/2020) 16 g 11   Current Facility-Administered Medications  Medication Dose  Route Frequency Provider Last Rate Last Admin  . cyanocobalamin ((VITAMIN B-12)) injection 1,000 mcg  1,000 mcg Intramuscular Q30 days Dettinger, Fransisca Kaufmann, MD   1,000 mcg at 03/09/20 1510    Allergies:   Statins, Cephalexin, and Penicillins   ROS:  Please see the history of present illness.   Otherwise, review of systems are positive for none.   All other systems are reviewed and negative.    PHYSICAL EXAM: VS:  BP 110/70   Pulse 60   Ht 5\' 5"  (1.651 m)   Wt 126 lb (57.2 kg)   BMI 20.97 kg/m  , BMI Body mass index is 20.97 kg/m. GENERAL:  Well appearing NECK:  No jugular venous distention, waveform within normal limits, carotid upstroke brisk and symmetric, no bruits, no thyromegaly LUNGS:  Clear to auscultation bilaterally CHEST:  Unremarkable HEART:  PMI not displaced or sustained,S1 and S2 within normal limits, no S3, no S4, no clicks, no rubs, no murmurs ABD:  Flat, positive bowel sounds normal in frequency in pitch, no bruits, no rebound, no guarding, no midline pulsatile mass, no hepatomegaly, no splenomegaly EXT:  2 plus pulses throughout, no edema, no cyanosis no clubbing   EKG:  EKG is not ordered today.    Recent Labs: 02/04/2020: ALT 13; BUN 14; Creatinine, Ser  0.67; Hemoglobin 12.6; Platelets 196; Potassium 4.1; Sodium 141; TSH 1.720    Lipid Panel    Component Value Date/Time   CHOL 178 02/04/2020 0901   TRIG 68 02/04/2020 0901   TRIG 90 03/30/2017 0811   HDL 63 02/04/2020 0901   HDL 71 03/30/2017 0811   CHOLHDL 2.8 02/04/2020 0901   LDLCALC 102 (H) 02/04/2020 0901   LDLCALC 126 (H) 05/06/2014 0803      Wt Readings from Last 3 Encounters:  04/07/20 126 lb (57.2 kg)  02/04/20 121 lb 12.8 oz (55.2 kg)  09/10/19 124 lb (56.2 kg)      Other studies Reviewed: Additional studies/ records that were reviewed today include: Labs. Review of the above records demonstrates:  Please see elsewhere in the note.     ASSESSMENT AND PLAN:  PALPITATION:   These  are not particularly problematic.  No change in therapy.  DYSLIPIDEMIA: She is tolerating low-dose pravastatin.  I doubt that it caused muscle problems.  She will have this followed by Dettinger, Fransisca Kaufmann, MD  COVID EDUCATION: We had a long discussion about this.  She has had vaccine hesitancy but might rethink this.  Current medicines are reviewed at length with the patient today.  The patient does not have concerns regarding medicines.  The following changes have been made:  no change  Labs/ tests ordered today include: None No orders of the defined types were placed in this encounter.    Disposition:   FU with me in six months at her request.     Signed, Minus Breeding, MD  04/07/2020 10:49 AM    [place]

## 2020-04-07 ENCOUNTER — Other Ambulatory Visit: Payer: Self-pay

## 2020-04-07 ENCOUNTER — Ambulatory Visit (INDEPENDENT_AMBULATORY_CARE_PROVIDER_SITE_OTHER): Payer: Medicare Other | Admitting: Cardiology

## 2020-04-07 ENCOUNTER — Encounter: Payer: Self-pay | Admitting: Cardiology

## 2020-04-07 VITALS — BP 110/70 | HR 60 | Ht 65.0 in | Wt 126.0 lb

## 2020-04-07 DIAGNOSIS — Z7189 Other specified counseling: Secondary | ICD-10-CM

## 2020-04-07 DIAGNOSIS — R002 Palpitations: Secondary | ICD-10-CM

## 2020-04-07 NOTE — Patient Instructions (Signed)
Medication Instructions:  The current medical regimen is effective;  continue present plan and medications.  *If you need a refill on your cardiac medications before your next appointment, please call your pharmacy*  Follow-Up: At [facility], you and your health needs are our priority.  As part of our continuing mission to provide you with exceptional heart care, we have created designated Provider Care Teams.  These Care Teams include your primary Cardiologist (physician) and Advanced Practice Providers (APPs -  Physician Assistants and Nurse Practitioners) who all work together to provide you with the care you need, when you need it.  We recommend signing up for the patient portal called "MyChart".  Sign up information is provided on this After Visit Summary.  MyChart is used to connect with patients for Virtual Visits (Telemedicine).  Patients are able to view lab/test results, encounter notes, upcoming appointments, etc.  Non-urgent messages can be sent to your provider as well.   To learn more about what you can do with MyChart, go to [email or web address].    Your next appointment:   6 month(s)  The format for your next appointment:   In Person  Provider:   James Hochrein, MD   Thank you for choosing [facility]!!     

## 2020-04-13 ENCOUNTER — Ambulatory Visit (INDEPENDENT_AMBULATORY_CARE_PROVIDER_SITE_OTHER): Payer: Medicare Other | Admitting: *Deleted

## 2020-04-13 ENCOUNTER — Other Ambulatory Visit: Payer: Self-pay

## 2020-04-13 DIAGNOSIS — E538 Deficiency of other specified B group vitamins: Secondary | ICD-10-CM | POA: Diagnosis not present

## 2020-04-13 DIAGNOSIS — Z23 Encounter for immunization: Secondary | ICD-10-CM

## 2020-04-13 NOTE — Progress Notes (Signed)
Pt given B12 injection IM left deltoid and tolerated well. °

## 2020-05-03 ENCOUNTER — Telehealth: Payer: Self-pay | Admitting: Family Medicine

## 2020-05-03 MED ORDER — LIVALO 2 MG PO TABS: 2.0000 mg | ORAL_TABLET | Freq: Every day | ORAL | 3 refills | Status: DC

## 2020-05-03 NOTE — Telephone Encounter (Signed)
Please let the patient know that I sent in Livalo to replace her pravastatin, it does not cause myalgias according to studies

## 2020-05-03 NOTE — Telephone Encounter (Signed)
Aware of new medication.

## 2020-05-03 NOTE — Telephone Encounter (Signed)
Cholesterol medication is causing muscle pain in legs and arms. She would like to something else called in.  Taking 10mg  qod of Pravastatin.

## 2020-05-04 ENCOUNTER — Telehealth: Payer: Self-pay

## 2020-05-04 NOTE — Telephone Encounter (Signed)
PA approved Faxed to [facility]: [place] Case ID: [id number] Need help? Call us at [phone]  Outcome Approvedtoday Your request has been approved  Drug Livalo 2MG  tablets Form [employer] Electronic PA Form [phone] NCPDP)

## 2020-05-04 NOTE — Telephone Encounter (Signed)
Prior authorization in process  [facility] LEE [place]  [employer] [place] - [place] Case ID: [id number] Need help? Call us at [phone]  Status Sent to [facility] 2MG  tablets Form [employer] Electronic [place] Form [phone] NCPDP)

## 2020-05-14 ENCOUNTER — Ambulatory Visit (INDEPENDENT_AMBULATORY_CARE_PROVIDER_SITE_OTHER): Payer: Medicare Other

## 2020-05-14 ENCOUNTER — Other Ambulatory Visit: Payer: Self-pay

## 2020-05-14 DIAGNOSIS — E538 Deficiency of other specified B group vitamins: Secondary | ICD-10-CM

## 2020-05-14 NOTE — Progress Notes (Signed)
Patient was giveb b12 injection and tolerated well.

## 2020-06-16 ENCOUNTER — Telehealth: Payer: Self-pay | Admitting: Family Medicine

## 2020-06-16 MED ORDER — ROSUVASTATIN CALCIUM 5 MG PO TABS
5.0000 mg | ORAL_TABLET | Freq: Every day | ORAL | 3 refills | Status: DC
Start: 2020-06-16 — End: 2020-08-11

## 2020-06-16 NOTE — Telephone Encounter (Signed)
Pt called requesting that Dr Dettinger change her cholesterol medicine. Says the current medicine she takes for cholesterol is causing muscle pain. Pt says she has been on it for about 2 mths but says she cant take it anymore. Pt says she has talked with her pharmacist Suanne Marker at [facility] and was told that the mildest Rx is Crestor, 5mg . Pt would like to try that. Generic is ok. Pt says she cant take Travastatin or Livalo. Wants to be called back around 3PM. Asked if it is ok to LM and pt says she doesn't do messages.

## 2020-06-16 NOTE — Telephone Encounter (Signed)
Sent Crestor for the patient

## 2020-06-17 NOTE — Telephone Encounter (Signed)
Aware Rx has been sent to pharmacy

## 2020-06-18 ENCOUNTER — Ambulatory Visit (INDEPENDENT_AMBULATORY_CARE_PROVIDER_SITE_OTHER): Payer: Medicare Other | Admitting: Family Medicine

## 2020-06-18 ENCOUNTER — Other Ambulatory Visit: Payer: Self-pay

## 2020-06-18 DIAGNOSIS — E538 Deficiency of other specified B group vitamins: Secondary | ICD-10-CM

## 2020-06-18 NOTE — Progress Notes (Signed)
Patient given b12 injection today in the left arm. Patient tolerated well.

## 2020-07-23 ENCOUNTER — Ambulatory Visit: Payer: Medicare Other

## 2020-08-11 ENCOUNTER — Ambulatory Visit (INDEPENDENT_AMBULATORY_CARE_PROVIDER_SITE_OTHER): Payer: Medicare Other | Admitting: Family Medicine

## 2020-08-11 ENCOUNTER — Encounter: Payer: Self-pay | Admitting: Family Medicine

## 2020-08-11 ENCOUNTER — Other Ambulatory Visit: Payer: Self-pay

## 2020-08-11 VITALS — BP 106/67 | HR 72 | Temp 97.9°F | Ht 65.0 in | Wt 127.0 lb

## 2020-08-11 DIAGNOSIS — E782 Mixed hyperlipidemia: Secondary | ICD-10-CM | POA: Diagnosis not present

## 2020-08-11 DIAGNOSIS — E538 Deficiency of other specified B group vitamins: Secondary | ICD-10-CM

## 2020-08-11 DIAGNOSIS — E559 Vitamin D deficiency, unspecified: Secondary | ICD-10-CM | POA: Diagnosis not present

## 2020-08-11 DIAGNOSIS — F419 Anxiety disorder, unspecified: Secondary | ICD-10-CM

## 2020-08-11 MED ORDER — ALPRAZOLAM 0.25 MG PO TABS: ORAL_TABLET | ORAL | 1 refills | Status: DC

## 2020-08-11 MED ORDER — FLUTICASONE PROPIONATE 50 MCG/ACT NA SUSP: NASAL | 11 refills | Status: DC

## 2020-08-11 NOTE — Progress Notes (Signed)
BP 106/67   Pulse 72   Temp 97.9 F (36.6 C)   Ht _0  (1.651 m)   Wt 127 lb (57.6 kg)   SpO2 96%   BMI 21.13 kg/m    Subjective:   Patient ID: Rose Martinez, female    DOB: [birth year], 85 y.o.   MRN: [id number]  HPI: Rose Martinez is a 85 y.o. female presenting on 08/11/2020 for Medical Management of Chronic Issues, Hyperlipidemia, and Arthritis   HPI Hyperlipidemia Patient is coming in for recheck of his hyperlipidemia. The patient is currently taking not currently on any medication although we recommended the Crestor 3 times a week, she will start it and try it.. They deny any issues with myalgias or history of liver damage from it. They deny any focal numbness or weakness or chest pain.   Patient is coming in for vitamin B12 deficiency and vitamin D deficiency recheck.  Patient is having multiple joint arthritis including her knees and her hands and her wrists and recommend to continue Tylenol arthritis and if worsens let us know.  Anxiety and adjustment disorder Current rx-Xanax 0.25 1/2 a tablet up to 3 times daily as needed, patient infrequently uses. # meds rx-45 Effectiveness of current meds-works well, uses infrequently Adverse reactions form meds-none  Pill count performed-No Last drug screen -08/04/2019 ( high risk q74m moderate risk q624mlow risk yearly ) Urine drug screen today- Yes Was the NCOwossoeviewed-yes  If yes were their any concerning findings? -None  No flowsheet data found.   Controlled substance contract signed on: Today  Relevant past medical, surgical, family and social history reviewed and updated as indicated. Interim medical history since our last visit reviewed. Allergies and medications reviewed and updated.  Review of Systems  Constitutional: Negative for chills and fever.  Eyes: Negative for visual disturbance.  Respiratory: Negative for chest tightness and shortness of breath.   Cardiovascular: Negative for chest pain and leg  swelling.  Musculoskeletal: Positive for arthralgias. Negative for back pain and gait problem.  Skin: Negative for rash.  Neurological: Negative for dizziness, light-headedness and headaches.  Psychiatric/Behavioral: Negative for agitation and behavioral problems.  All other systems reviewed and are negative.   Per HPI unless specifically indicated above   Allergies as of 08/11/2020      Reactions   Statins    myalgias   Cephalexin Other (See Comments)   Pt does not remember   Penicillins Other (See Comments)   Pt's mother was severly allergic, so she has never taken PCN.      Medication List       Accurate as of August 11, 2020  8:44 AM. If you have any questions, ask your nurse or doctor.        STOP taking these medications   rosuvastatin 5 MG tablet Commonly known as: Crestor Stopped by: JoFransisca Kaufmannettinger, MD     TAKE these medications   ALIGN PO Take 1 capsule by mouth daily.   ALPRAZolam 0.25 MG tablet Commonly known as: XANAX TAKE (1/2) TABLET UP TO THREE TIMES DAILY. What changed:   when to take this  reasons to take this   cholecalciferol 1000 units tablet Commonly known as: VITAMIN D Take 2,000 Units by mouth daily.   fluticasone 50 MCG/ACT nasal spray Commonly known as: FLONASE SPRAY 1 SPRAY IN EACH NOSTRIL ONCE DAILY.   loratadine 10 MG tablet Commonly known as: CLARITIN Take 10 mg by mouth daily.   montelukast 10  MG tablet Commonly known as: SINGULAIR TAKE (1) TABLET DAILY AS DIRECTED.   pseudoephedrine-guaifenesin 60-600 MG 12 hr tablet Commonly known as: MUCINEX D Take 1 table 1-2 times daily What changed:   when to take this  reasons to take this        Objective:   BP 106/67   Pulse 72   Temp 97.9 F (36.6 C)   Ht _0  (1.651 m)   Wt 127 lb (57.6 kg)   SpO2 96%   BMI 21.13 kg/m   Wt Readings from Last 3 Encounters:  08/11/20 127 lb (57.6 kg)  04/07/20 126 lb (57.2 kg)  02/04/20 121 lb 12.8 oz (55.2 kg)      Physical Exam Vitals and nursing note reviewed.  Constitutional:      General: She is not in acute distress.    Appearance: She is well-developed. She is not diaphoretic.  Eyes:     Conjunctiva/sclera: Conjunctivae normal.  Cardiovascular:     Rate and Rhythm: Normal rate and regular rhythm.     Heart sounds: Normal heart sounds. No murmur heard.   Pulmonary:     Effort: Pulmonary effort is normal. No respiratory distress.     Breath sounds: Normal breath sounds. No wheezing.  Musculoskeletal:        General: No tenderness. Normal range of motion.  Skin:    General: Skin is warm and dry.     Findings: No rash.  Neurological:     Mental Status: She is alert and oriented to person, place, and time.     Coordination: Coordination normal.  Psychiatric:        Behavior: Behavior normal.       Assessment & Plan:   Problem List Items Addressed This Visit      Other   Vitamin D deficiency   Relevant Orders   VITAMIN D 25 Hydroxy (Vit-D Deficiency, Fractures)   Hyperlipidemia - Primary   Relevant Orders   CBC with Differential/Platelet   CMP14+EGFR   Lipid panel   B12 deficiency   Relevant Orders   CBC with Differential/Platelet   Vitamin B12      Patient is doing well, refilled the alprazolam, follow-up in 6 months, no medication changes, she will start to try the Crestor. Follow up plan: Return in about 6 months (around 02/11/2021), or if symptoms worsen or fail to improve, for Hyperlipidemia and vitamin D and B12 and anxiety.  Counseling provided for all of the vaccine components Orders Placed This Encounter  Procedures  . CBC with Differential/Platelet  . CMP14+EGFR  . Lipid panel  . VITAMIN D 25 Hydroxy (Vit-D Deficiency, Fractures)  . Vitamin B12    Caryl Pina, MD [facility] 08/11/2020, 8:44 AM

## 2020-08-11 NOTE — Addendum Note (Signed)
Addended by: Alphonzo Dublin on: 08/11/2020 08:53 AM   Modules accepted: Orders

## 2020-08-12 LAB — LIPID PANEL
Chol/HDL Ratio: 3.6 ratio (ref 0.0–4.4)
Cholesterol, Total: 227 mg/dL — ABNORMAL HIGH (ref 100–199)
HDL: 63 mg/dL (ref >39)
LDL Chol Calc (NIH): 147 mg/dL — ABNORMAL HIGH (ref 0–99)
Triglycerides: 98 mg/dL (ref 0–149)
VLDL Cholesterol Cal: 17 mg/dL (ref 5–40)

## 2020-08-12 LAB — CMP14+EGFR
ALT: 11 IU/L (ref 0–32)
AST: 13 IU/L (ref 0–40)
Albumin/Globulin Ratio: 1.6 (ref 1.2–2.2)
Albumin: 4.3 g/dL (ref 3.6–4.6)
Alkaline Phosphatase: 75 IU/L (ref 48–121)
BUN/Creatinine Ratio: 18 (ref 12–28)
BUN: 12 mg/dL (ref 8–27)
Bilirubin Total: 0.4 mg/dL (ref 0.0–1.2)
CO2: 26 mmol/L (ref 20–29)
Calcium: 9.3 mg/dL (ref 8.7–10.3)
Chloride: 108 mmol/L — ABNORMAL HIGH (ref 96–106)
Creatinine, Ser: 0.66 mg/dL (ref 0.57–1.00)
GFR calc Af Amer: 95 mL/min/{1.73_m2} (ref >59)
GFR calc non Af Amer: 83 mL/min/{1.73_m2} (ref >59)
Globulin, Total: 2.7 g/dL (ref 1.5–4.5)
Glucose: 92 mg/dL (ref 65–99)
Potassium: 4.8 mmol/L (ref 3.5–5.2)
Sodium: 145 mmol/L — ABNORMAL HIGH (ref 134–144)
Total Protein: 7 g/dL (ref 6.0–8.5)

## 2020-08-12 LAB — CBC WITH DIFFERENTIAL/PLATELET
Basophils Absolute: 0.1 10*3/uL (ref 0.0–0.2)
Basos: 1 %
EOS (ABSOLUTE): 0.1 10*3/uL (ref 0.0–0.4)
Eos: 1 %
Hematocrit: 41.5 % (ref 34.0–46.6)
Hemoglobin: 13.7 g/dL (ref 11.1–15.9)
Immature Grans (Abs): 0 10*3/uL (ref 0.0–0.1)
Immature Granulocytes: 0 %
Lymphocytes Absolute: 1.5 10*3/uL (ref 0.7–3.1)
Lymphs: 22 %
MCH: 32.6 pg (ref 26.6–33.0)
MCHC: 33 g/dL (ref 31.5–35.7)
MCV: 99 fL — ABNORMAL HIGH (ref 79–97)
Monocytes Absolute: 0.4 10*3/uL (ref 0.1–0.9)
Monocytes: 6 %
Neutrophils Absolute: 4.7 10*3/uL (ref 1.4–7.0)
Neutrophils: 70 %
Platelets: 176 10*3/uL (ref 150–450)
RBC: 4.2 x10E6/uL (ref 3.77–5.28)
RDW: 12 % (ref 11.7–15.4)
WBC: 6.8 10*3/uL (ref 3.4–10.8)

## 2020-08-12 LAB — VITAMIN B12: Vitamin B-12: 641 pg/mL (ref 232–1245)

## 2020-08-12 LAB — VITAMIN D 25 HYDROXY (VIT D DEFICIENCY, FRACTURES): Vit D, 25-Hydroxy: 60.4 ng/mL (ref 30.0–100.0)

## 2020-08-13 ENCOUNTER — Other Ambulatory Visit: Payer: Self-pay

## 2020-08-13 ENCOUNTER — Ambulatory Visit (INDEPENDENT_AMBULATORY_CARE_PROVIDER_SITE_OTHER): Payer: Medicare Other | Admitting: *Deleted

## 2020-08-13 DIAGNOSIS — E538 Deficiency of other specified B group vitamins: Secondary | ICD-10-CM | POA: Diagnosis not present

## 2020-08-13 DIAGNOSIS — Z23 Encounter for immunization: Secondary | ICD-10-CM

## 2020-08-13 NOTE — Progress Notes (Signed)
B12 Injection given and tolerated well Shingrix given and tolerated well

## 2020-08-14 LAB — TOXASSURE SELECT 13 (MW), URINE

## 2020-09-13 ENCOUNTER — Telehealth: Payer: Self-pay | Admitting: Cardiology

## 2020-09-13 NOTE — Telephone Encounter (Signed)
Called the pt back re: her getting the COVID vaccine but she advised me that she will not get the vaccine unless she speaks with Dr. Percival Spanish directly... she says that she cannot talk to me and speaking with a nurse will not be enough for her... I advised her that Dr. Percival Spanish is in clinic and may not be able to call her today and she says she is okay with him calling her anytime this week.   Will forward to him.

## 2020-09-13 NOTE — Telephone Encounter (Signed)
New message   Pt called in requested for Dr Percival Spanish to call her, she has some questions about getting the Covid Vaccine,  She stated she did not want to speak with a nurse, she wanted to speak with Dr Percival Spanish directly  Best number - [phone]

## 2020-09-14 ENCOUNTER — Other Ambulatory Visit: Payer: Self-pay

## 2020-09-14 ENCOUNTER — Ambulatory Visit (INDEPENDENT_AMBULATORY_CARE_PROVIDER_SITE_OTHER): Payer: Medicare Other | Admitting: *Deleted

## 2020-09-14 DIAGNOSIS — E538 Deficiency of other specified B group vitamins: Secondary | ICD-10-CM | POA: Diagnosis not present

## 2020-09-14 NOTE — Progress Notes (Signed)
B12 injection given and tolerated well.  

## 2020-10-12 ENCOUNTER — Other Ambulatory Visit: Payer: Self-pay

## 2020-10-12 ENCOUNTER — Ambulatory Visit (INDEPENDENT_AMBULATORY_CARE_PROVIDER_SITE_OTHER): Payer: Medicare Other | Admitting: *Deleted

## 2020-10-12 DIAGNOSIS — E538 Deficiency of other specified B group vitamins: Secondary | ICD-10-CM | POA: Diagnosis not present

## 2020-10-12 NOTE — Progress Notes (Signed)
Low good    Cardiology Office Note   Date:  10/13/2020   ID:  Rose Martinez, DOB [birth year], MRN [id number]  PCP:  Dettinger, Fransisca Kaufmann, MD  Cardiologist:   No primary care provider on file.   Chief Complaint  Patient presents with   Palpitations      History of Present Illness: Rose Martinez is a 85 y.o. female who presents for followup of palpitations.  Since I last saw her she called and had questions about getting the COVID vaccine.  I called and answered those questions.  She has done well since I last saw her.  She has no new cardiovascular complaints.  She feels occasional palpitations.  She likes to drink a little caffeine.  She stays very active.  She has occasional numbness in her feet.  She has very brief episodes of lightheadedness sporadically.  She is not having any presyncope or syncope.  She has had no chest pressure, neck or arm discomfort.   Past Medical History:  Diagnosis Date   Anxiety    Hyperlipidemia    Mild   Osteoporosis    Palpitations     Past Surgical History:  Procedure Laterality Date   CYSTECTOMY     Breast   EYE SURGERY     OOPHORECTOMY       Current Outpatient Medications  Medication Sig Dispense Refill   ALPRAZolam (XANAX) 0.25 MG tablet TAKE (1/2) TABLET UP TO THREE TIMES DAILY. 45 tablet 1   cholecalciferol (VITAMIN D) 1000 UNITS tablet Take 2,000 Units by mouth daily.      fluticasone (FLONASE) 50 MCG/ACT nasal spray SPRAY 1 SPRAY IN EACH NOSTRIL ONCE DAILY. 16 g 11   loratadine (CLARITIN) 10 MG tablet Take 10 mg by mouth daily.      montelukast (SINGULAIR) 10 MG tablet TAKE (1) TABLET DAILY AS DIRECTED. 30 tablet 0   Probiotic Product (ALIGN PO) Take 1 capsule by mouth daily.     pseudoephedrine-guaifenesin (MUCINEX D) 60-600 MG 12 hr tablet Take 1 table 1-2 times daily (Patient taking differently: as needed. Take 1 table 1-2 times daily) 30 tablet 11   rosuvastatin (CRESTOR) 5 MG tablet 5 mg. Take one tablet  twice a week     Current Facility-Administered Medications  Medication Dose Route Frequency Provider Last Rate Last Admin   cyanocobalamin ((VITAMIN B-12)) injection 1,000 mcg  1,000 mcg Intramuscular Q30 days Dettinger, Fransisca Kaufmann, MD   1,000 mcg at 10/12/20 1514    Allergies:   Statins, Cephalexin, and Penicillins   ROS:  Please see the history of present illness.   Otherwise, review of systems are positive for none.   All other systems are reviewed and negative.    PHYSICAL EXAM: VS:  BP 110/64    Pulse 64    Ht 5\' 5"  (1.651 m)    Wt 131 lb (59.4 kg)    BMI 21.80 kg/m  , BMI Body mass index is 21.8 kg/m. GENERAL:  Well appearing NECK:  No jugular venous distention, waveform within normal limits, carotid upstroke brisk and symmetric, no bruits, no thyromegaly LUNGS:  Clear to auscultation bilaterally CHEST:  Unremarkable HEART:  PMI not displaced or sustained,S1 and S2 within normal limits, no S3, no S4, no clicks, no rubs, no murmurs ABD:  Flat, positive bowel sounds normal in frequency in pitch, no bruits, no rebound, no guarding, no midline pulsatile mass, no hepatomegaly, no splenomegaly EXT:  2 plus pulses throughout, no edema, no cyanosis  no clubbing  EKG:  EKG is  ordered today. Sinus rhythm, rate 64, left axis deviation, poor anterior R wave progression, no acute ST-T wave changes.   Recent Labs: 02/04/2020: TSH 1.720 08/11/2020: ALT 11; BUN 12; Creatinine, Ser 0.66; Hemoglobin 13.7; Platelets 176; Potassium 4.8; Sodium 145    Lipid Panel    Component Value Date/Time   CHOL 227 (H) 08/11/2020 0844   TRIG 98 08/11/2020 0844   TRIG 90 03/30/2017 0811   HDL 63 08/11/2020 0844   HDL 71 03/30/2017 0811   CHOLHDL 3.6 08/11/2020 0844   LDLCALC 147 (H) 08/11/2020 0844   LDLCALC 126 (H) 05/06/2014 0803      Wt Readings from Last 3 Encounters:  10/13/20 131 lb (59.4 kg)  08/11/20 127 lb (57.6 kg)  04/07/20 126 lb (57.2 kg)      Other studies Reviewed: Additional  studies/ records that were reviewed today include: Labs. Review of the above records demonstrates:  Please see elsewhere in the note.     ASSESSMENT AND PLAN:  PALPITATION:    The patient has some mild palpitations that are not typically problematic.  No change in therapy.   DYSLIPIDEMIA: She is tolerating low-dose Crestor.  Her LDL is still slightly elevated at 147 although her HDL is 63.  She will try to bump his dose up to 3 times per week.    COVID EDUCATION:   We had a long discussion on the phone about this previously.    Current medicines are reviewed at length with the patient today.  The patient does not have concerns regarding medicines.  The following changes have been made: As above  Labs/ tests ordered today include: None  Orders Placed This Encounter  Procedures   EKG 12-Lead     Disposition:   FU with me in 6 months per her request   Signed, Minus Breeding, MD  10/13/2020 2:17 PM    [facility]

## 2020-10-13 ENCOUNTER — Encounter: Payer: Self-pay | Admitting: Cardiology

## 2020-10-13 ENCOUNTER — Ambulatory Visit (INDEPENDENT_AMBULATORY_CARE_PROVIDER_SITE_OTHER): Payer: Medicare Other | Admitting: Cardiology

## 2020-10-13 VITALS — BP 110/64 | HR 64 | Ht 65.0 in | Wt 131.0 lb

## 2020-10-13 DIAGNOSIS — R002 Palpitations: Secondary | ICD-10-CM | POA: Diagnosis not present

## 2020-10-13 NOTE — Patient Instructions (Signed)
Medication Instructions:  The current medical regimen is effective;  continue present plan and medications.  *If you need a refill on your cardiac medications before your next appointment, please call your pharmacy*  Follow-Up: At [facility], you and your health needs are our priority.  As part of our continuing mission to provide you with exceptional heart care, we have created designated Provider Care Teams.  These Care Teams include your primary Cardiologist (physician) and Advanced Practice Providers (APPs -  Physician Assistants and Nurse Practitioners) who all work together to provide you with the care you need, when you need it.  We recommend signing up for the patient portal called "MyChart".  Sign up information is provided on this After Visit Summary.  MyChart is used to connect with patients for Virtual Visits (Telemedicine).  Patients are able to view lab/test results, encounter notes, upcoming appointments, etc.  Non-urgent messages can be sent to your provider as well.   To learn more about what you can do with MyChart, go to [email or web address].    Your next appointment:   6 month(s)  The format for your next appointment:   In Person  Provider:   James Hochrein, MD   Thank you for choosing [facility]!!     

## 2020-11-16 ENCOUNTER — Ambulatory Visit (INDEPENDENT_AMBULATORY_CARE_PROVIDER_SITE_OTHER): Payer: Medicare Other

## 2020-11-16 ENCOUNTER — Other Ambulatory Visit: Payer: Self-pay

## 2020-11-16 DIAGNOSIS — E538 Deficiency of other specified B group vitamins: Secondary | ICD-10-CM

## 2020-11-16 NOTE — Progress Notes (Signed)
B12 injection given and tolerated well.  

## 2020-12-16 ENCOUNTER — Other Ambulatory Visit: Payer: Self-pay

## 2020-12-16 ENCOUNTER — Ambulatory Visit (INDEPENDENT_AMBULATORY_CARE_PROVIDER_SITE_OTHER): Payer: Medicare Other | Admitting: Family Medicine

## 2020-12-16 DIAGNOSIS — E538 Deficiency of other specified B group vitamins: Secondary | ICD-10-CM | POA: Diagnosis not present

## 2021-01-13 DIAGNOSIS — R42 Dizziness and giddiness: Secondary | ICD-10-CM | POA: Diagnosis not present

## 2021-01-13 DIAGNOSIS — H60312 Diffuse otitis externa, left ear: Secondary | ICD-10-CM | POA: Diagnosis not present

## 2021-01-13 DIAGNOSIS — Z79899 Other long term (current) drug therapy: Secondary | ICD-10-CM | POA: Diagnosis not present

## 2021-01-18 ENCOUNTER — Ambulatory Visit: Payer: Medicare Other

## 2021-01-19 ENCOUNTER — Ambulatory Visit (INDEPENDENT_AMBULATORY_CARE_PROVIDER_SITE_OTHER): Payer: Medicare Other

## 2021-01-19 ENCOUNTER — Other Ambulatory Visit: Payer: Self-pay

## 2021-01-19 DIAGNOSIS — E538 Deficiency of other specified B group vitamins: Secondary | ICD-10-CM | POA: Diagnosis not present

## 2021-01-19 NOTE — Progress Notes (Signed)
Cyanocobalamin injection given to right deltoid.  Patient tolerated well. 

## 2021-02-07 DIAGNOSIS — Z961 Presence of intraocular lens: Secondary | ICD-10-CM | POA: Diagnosis not present

## 2021-02-07 DIAGNOSIS — H2512 Age-related nuclear cataract, left eye: Secondary | ICD-10-CM | POA: Diagnosis not present

## 2021-02-10 ENCOUNTER — Ambulatory Visit (INDEPENDENT_AMBULATORY_CARE_PROVIDER_SITE_OTHER): Payer: Medicare Other | Admitting: Family Medicine

## 2021-02-10 ENCOUNTER — Other Ambulatory Visit: Payer: Self-pay

## 2021-02-10 ENCOUNTER — Encounter: Payer: Self-pay | Admitting: Family Medicine

## 2021-02-10 VITALS — BP 119/63 | HR 62 | Ht 65.0 in | Wt 134.0 lb

## 2021-02-10 DIAGNOSIS — M810 Age-related osteoporosis without current pathological fracture: Secondary | ICD-10-CM | POA: Diagnosis not present

## 2021-02-10 DIAGNOSIS — E538 Deficiency of other specified B group vitamins: Secondary | ICD-10-CM

## 2021-02-10 DIAGNOSIS — E559 Vitamin D deficiency, unspecified: Secondary | ICD-10-CM

## 2021-02-10 DIAGNOSIS — E782 Mixed hyperlipidemia: Secondary | ICD-10-CM

## 2021-02-10 DIAGNOSIS — F4323 Adjustment disorder with mixed anxiety and depressed mood: Secondary | ICD-10-CM

## 2021-02-10 MED ORDER — FLUTICASONE PROPIONATE 50 MCG/ACT NA SUSP: NASAL | 11 refills | Status: DC

## 2021-02-10 MED ORDER — ALPRAZOLAM 0.25 MG PO TABS: ORAL_TABLET | ORAL | 1 refills | Status: DC

## 2021-02-10 NOTE — Progress Notes (Signed)
BP 119/63   Pulse 62   Ht 5' 5" (1.651 m)   Wt 134 lb (60.8 kg)   SpO2 100%   BMI 22.30 kg/m    Subjective:   Patient ID: Rose Martinez, female    DOB: January 18, 1935, 86 y.o.   MRN: [id number]  HPI: Rose Martinez is a 86 y.o. female presenting on 02/10/2021 for Medical Management of Chronic Issues   HPI Osteoporosis/osteopenia Fractures or history of fracture: None Medication: Calcium and vitamin D Duration of treatment: Many years Last bone density scan: 2016 12 patient does not want to do a new one because she says she think any medicine Last T score:   Hyperlipidemia Patient is coming in for recheck of his hyperlipidemia. The patient is currently taking no medication has been doing diet control.  She says she did not tolerate the Crestor.. They deny any issues with myalgias or history of liver damage from it. They deny any focal numbness or weakness or chest pain.   Vitamin D and vitamin B12 deficiency recheck Patient is coming in for vitamin D and B12 deficiency recheck, she is taking supplement for and denies any major issues.  Relevant past medical, surgical, family and social history reviewed and updated as indicated. Interim medical history since our last visit reviewed. Allergies and medications reviewed and updated.  Review of Systems  Constitutional: Negative for chills and fever.  HENT: Negative for congestion, ear discharge and ear pain.   Eyes: Negative for redness and visual disturbance.  Respiratory: Negative for chest tightness and shortness of breath.   Cardiovascular: Negative for chest pain and leg swelling.  Genitourinary: Negative for difficulty urinating and dysuria.  Musculoskeletal: Positive for arthralgias (Gets arthritis in her toe and some of the fingers on her left hand). Negative for back pain and gait problem.  Skin: Negative for rash.  Neurological: Negative for light-headedness and headaches.  Psychiatric/Behavioral: Negative for agitation  and behavioral problems.  All other systems reviewed and are negative.   Per HPI unless specifically indicated above   Allergies as of 02/10/2021      Reactions   Statins    myalgias   Cephalexin Other (See Comments)   Pt does not remember   Penicillins Other (See Comments)   Pt's mother was severly allergic, so she has never taken PCN.      Medication List       Accurate as of February 10, 2021  8:07 AM. If you have any questions, ask your nurse or doctor.        ALIGN PO Take 1 capsule by mouth daily.   ALPRAZolam 0.25 MG tablet Commonly known as: XANAX TAKE (1/2) TABLET UP TO THREE TIMES DAILY.   cholecalciferol 1000 units tablet Commonly known as: VITAMIN D Take 2,000 Units by mouth daily.   fluticasone 50 MCG/ACT nasal spray Commonly known as: FLONASE SPRAY 1 SPRAY IN EACH NOSTRIL ONCE DAILY.   loratadine 10 MG tablet Commonly known as: CLARITIN Take 10 mg by mouth daily.   montelukast 10 MG tablet Commonly known as: SINGULAIR TAKE (1) TABLET DAILY AS DIRECTED.   pseudoephedrine-guaifenesin 60-600 MG 12 hr tablet Commonly known as: MUCINEX D Take 1 table 1-2 times daily What changed:   when to take this  reasons to take this   rosuvastatin 5 MG tablet Commonly known as: CRESTOR 5 mg. Take one tablet twice a week        Objective:   BP 119/63   Pulse  62   Ht 5' 5" (1.651 m)   Wt 134 lb (60.8 kg)   SpO2 100%   BMI 22.30 kg/m   Wt Readings from Last 3 Encounters:  02/10/21 134 lb (60.8 kg)  10/13/20 131 lb (59.4 kg)  08/11/20 127 lb (57.6 kg)    Physical Exam Vitals and nursing note reviewed.  Constitutional:      General: She is not in acute distress.    Appearance: She is well-developed and well-nourished. She is not diaphoretic.  Eyes:     Extraocular Movements: EOM normal.     Conjunctiva/sclera: Conjunctivae normal.  Cardiovascular:     Rate and Rhythm: Normal rate and regular rhythm.     Pulses: Intact distal pulses.      Heart sounds: Normal heart sounds. No murmur heard.   Pulmonary:     Effort: Pulmonary effort is normal. No respiratory distress.     Breath sounds: Normal breath sounds. No wheezing.  Musculoskeletal:        General: No tenderness or edema. Normal range of motion.  Skin:    General: Skin is warm and dry.     Findings: No rash.  Neurological:     Mental Status: She is alert and oriented to person, place, and time.     Coordination: Coordination normal.  Psychiatric:        Mood and Affect: Mood and affect normal.        Behavior: Behavior normal.       Assessment & Plan:   Problem List Items Addressed This Visit      Musculoskeletal and Integument   Osteoporosis     Other   Vitamin D deficiency   Relevant Orders   VITAMIN D 25 Hydroxy (Vit-D Deficiency, Fractures)   Hyperlipidemia   Relevant Orders   CBC with Differential/Platelet   CMP14+EGFR   Lipid panel   VITAMIN D 25 Hydroxy (Vit-D Deficiency, Fractures)   B12 deficiency - Primary   Relevant Orders   Vitamin B12    Other Visit Diagnoses    Low vitamin B12 level       Relevant Orders   Vitamin B12    Patient has occasional anxiety, needs refill on the problem, does not use it very frequently and did not even fill the prescription from August but likes to keep it there just in case  Patient declines bone density scan and vaccination today.  Also declines mammogram today.  Follow up plan: Return in about 6 months (around 08/10/2021), or if symptoms worsen or fail to improve, for Recheck cholesterol and anxiety.  Counseling provided for all of the vaccine components No orders of the defined types were placed in this encounter.   Caryl Pina, MD [facility] 02/10/2021, 8:07 AM

## 2021-02-11 LAB — CMP14+EGFR
ALT: 14 IU/L (ref 0–32)
AST: 18 IU/L (ref 0–40)
Albumin/Globulin Ratio: 1.9 (ref 1.2–2.2)
Albumin: 4.2 g/dL (ref 3.6–4.6)
Alkaline Phosphatase: 84 IU/L (ref 44–121)
BUN/Creatinine Ratio: 24 (ref 12–28)
BUN: 17 mg/dL (ref 8–27)
Bilirubin Total: 0.3 mg/dL (ref 0.0–1.2)
CO2: 26 mmol/L (ref 20–29)
Calcium: 9.3 mg/dL (ref 8.7–10.3)
Chloride: 106 mmol/L (ref 96–106)
Creatinine, Ser: 0.7 mg/dL (ref 0.57–1.00)
GFR calc Af Amer: 93 mL/min/{1.73_m2} (ref >59)
GFR calc non Af Amer: 80 mL/min/{1.73_m2} (ref >59)
Globulin, Total: 2.2 g/dL (ref 1.5–4.5)
Glucose: 95 mg/dL (ref 65–99)
Potassium: 4.9 mmol/L (ref 3.5–5.2)
Sodium: 144 mmol/L (ref 134–144)
Total Protein: 6.4 g/dL (ref 6.0–8.5)

## 2021-02-11 LAB — CBC WITH DIFFERENTIAL/PLATELET
Basophils Absolute: 0 10*3/uL (ref 0.0–0.2)
Basos: 1 %
EOS (ABSOLUTE): 0.1 10*3/uL (ref 0.0–0.4)
Eos: 2 %
Hematocrit: 41 % (ref 34.0–46.6)
Hemoglobin: 13.8 g/dL (ref 11.1–15.9)
Immature Grans (Abs): 0 10*3/uL (ref 0.0–0.1)
Immature Granulocytes: 0 %
Lymphocytes Absolute: 1.5 10*3/uL (ref 0.7–3.1)
Lymphs: 30 %
MCH: 32.9 pg (ref 26.6–33.0)
MCHC: 33.7 g/dL (ref 31.5–35.7)
MCV: 98 fL — ABNORMAL HIGH (ref 79–97)
Monocytes Absolute: 0.4 10*3/uL (ref 0.1–0.9)
Monocytes: 9 %
Neutrophils Absolute: 2.9 10*3/uL (ref 1.4–7.0)
Neutrophils: 58 %
Platelets: 173 10*3/uL (ref 150–450)
RBC: 4.2 x10E6/uL (ref 3.77–5.28)
RDW: 11.5 % — ABNORMAL LOW (ref 11.7–15.4)
WBC: 5 10*3/uL (ref 3.4–10.8)

## 2021-02-11 LAB — LIPID PANEL
Chol/HDL Ratio: 3.3 ratio (ref 0.0–4.4)
Cholesterol, Total: 226 mg/dL — ABNORMAL HIGH (ref 100–199)
HDL: 68 mg/dL (ref >39)
LDL Chol Calc (NIH): 145 mg/dL — ABNORMAL HIGH (ref 0–99)
Triglycerides: 77 mg/dL (ref 0–149)
VLDL Cholesterol Cal: 13 mg/dL (ref 5–40)

## 2021-02-11 LAB — VITAMIN B12: Vitamin B-12: 711 pg/mL (ref 232–1245)

## 2021-02-11 LAB — VITAMIN D 25 HYDROXY (VIT D DEFICIENCY, FRACTURES): Vit D, 25-Hydroxy: 53.5 ng/mL (ref 30.0–100.0)

## 2021-02-16 ENCOUNTER — Ambulatory Visit (INDEPENDENT_AMBULATORY_CARE_PROVIDER_SITE_OTHER): Payer: Medicare Other

## 2021-02-16 ENCOUNTER — Other Ambulatory Visit: Payer: Self-pay

## 2021-02-16 DIAGNOSIS — E538 Deficiency of other specified B group vitamins: Secondary | ICD-10-CM

## 2021-03-15 NOTE — Progress Notes (Signed)
Cardiology Office Note   Date:  03/16/2021   ID:  Sharunda, Salmon [birth year], MRN [id number]  PCP:  Dettinger, Fransisca Kaufmann, MD  Cardiologist:   No primary care provider on file.   Chief Complaint  Patient presents with  . Leg heaviness      History of Present Illness: Rose Martinez is a 86 y.o. female who presents for followup of palpitations.  She still has these occasionally but they are not particularly problematic.  She had some leg heaviness.  She has been told she has some ataxia.  She does not have any acute cardiac complaints.  She still tries to stay active around her property.  She denies any new shortness of breath, PND or orthopnea.  She has had no presyncope or syncope.  She has had no edema.  Of note I tried to increase her statin at the last visit because she is not at target cholesterol but go from 2-3 times a week of her Crestor.  She is only taking it once in a great while.   Past Medical History:  Diagnosis Date  . Anxiety   . Hyperlipidemia    Mild  . Osteoporosis   . Palpitations     Past Surgical History:  Procedure Laterality Date  . CYSTECTOMY     Breast  . EYE SURGERY    . OOPHORECTOMY       Current Outpatient Medications  Medication Sig Dispense Refill  . ALPRAZolam (XANAX) 0.25 MG tablet TAKE (1/2) TABLET UP TO THREE TIMES DAILY. 45 tablet 1  . betamethasone dipropionate 0.05 % lotion Apply topically as needed.    . betamethasone, augmented, (DIPROLENE) 0.05 % lotion Apply 1 application topically daily.    . cholecalciferol (VITAMIN D) 1000 UNITS tablet Take 2,000 Units by mouth daily.     Marland Kitchen CIPRODEX OTIC suspension Place 4 drops into the left ear in the morning, at noon, in the evening, and at bedtime.    . fluticasone (FLONASE) 50 MCG/ACT nasal spray SPRAY 1 SPRAY IN EACH NOSTRIL ONCE DAILY. 16 g 11  . loratadine (CLARITIN) 10 MG tablet Take 10 mg by mouth daily.     . montelukast (SINGULAIR) 10 MG tablet TAKE (1) TABLET DAILY AS  DIRECTED. 30 tablet 0  . Probiotic Product (ALIGN PO) Take 1 capsule by mouth daily.    . pseudoephedrine-guaifenesin (MUCINEX D) 60-600 MG 12 hr tablet Take 1 table 1-2 times daily (Patient taking differently: as needed. Take 1 table 1-2 times daily) 30 tablet 11   No current facility-administered medications for this visit.    Allergies:   Statins, Cephalexin, and Penicillins   ROS:  Please see the history of present illness.   Otherwise, review of systems are positive for none.   All other systems are reviewed and negative.    PHYSICAL EXAM: VS:  BP 114/70   Pulse 60   Ht 5\' 5"  (1.651 m)   Wt 133 lb (60.3 kg)   BMI 22.13 kg/m  , BMI Body mass index is 22.13 kg/m. GENERAL:  Well appearing NECK:  No jugular venous distention, waveform within normal limits, carotid upstroke brisk and symmetric, no bruits, no thyromegaly LUNGS:  Clear to auscultation bilaterally CHEST:  Unremarkable HEART:  PMI not displaced or sustained,S1 and S2 within normal limits, no S3, no S4, no clicks, no rubs, no murmurs ABD:  Flat, positive bowel sounds normal in frequency in pitch, no bruits, no rebound, no guarding, no midline  pulsatile mass, no hepatomegaly, no splenomegaly EXT:  2 plus pulses throughout, no edema, no cyanosis no clubbing   EKG:  EKG is not ordered today.    Recent Labs: 02/10/2021: ALT 14; BUN 17; Creatinine, Ser 0.70; Hemoglobin 13.8; Platelets 173; Potassium 4.9; Sodium 144    Lipid Panel    Component Value Date/Time   CHOL 226 (H) 02/10/2021 0849   TRIG 77 02/10/2021 0849   TRIG 90 03/30/2017 0811   HDL 68 02/10/2021 0849   HDL 71 03/30/2017 0811   CHOLHDL 3.3 02/10/2021 0849   LDLCALC 145 (H) 02/10/2021 0849   LDLCALC 126 (H) 05/06/2014 0803      Wt Readings from Last 3 Encounters:  03/16/21 133 lb (60.3 kg)  02/10/21 134 lb (60.8 kg)  10/13/20 131 lb (59.4 kg)      Other studies Reviewed: Additional studies/ records that were reviewed today include:  None. Review of the above records demonstrates:  Please see elsewhere in the note.     ASSESSMENT AND PLAN:  PALPITATION:      She is not particularly bothered by these.  No change in therapy.   DYSLIPIDEMIA:   She is not able to take a statin.  I have removed this from her list completely.  LEG HEAVINESS: I do not see a vascular etiology to this.  No further cardiovascular testing is suggested.  She is encouraged to continue physical activity.   Current medicines are reviewed at length with the patient today.  The patient does not have concerns regarding medicines.  The following changes have been made:  None  Labs/ tests ordered today include:   None  No orders of the defined types were placed in this encounter.    Disposition:   FU with me in 6 months per her request   Signed, Minus Breeding, MD  03/16/2021 12:36 PM    [facility]

## 2021-03-16 ENCOUNTER — Encounter: Payer: Self-pay | Admitting: Cardiology

## 2021-03-16 ENCOUNTER — Other Ambulatory Visit: Payer: Self-pay

## 2021-03-16 ENCOUNTER — Ambulatory Visit (INDEPENDENT_AMBULATORY_CARE_PROVIDER_SITE_OTHER): Payer: Medicare Other | Admitting: Cardiology

## 2021-03-16 VITALS — BP 114/70 | HR 60 | Ht 65.0 in | Wt 133.0 lb

## 2021-03-16 DIAGNOSIS — R002 Palpitations: Secondary | ICD-10-CM

## 2021-03-16 DIAGNOSIS — E785 Hyperlipidemia, unspecified: Secondary | ICD-10-CM

## 2021-03-16 NOTE — Patient Instructions (Signed)
Medication Instructions:  The current medical regimen is effective;  continue present plan and medications.  *If you need a refill on your cardiac medications before your next appointment, please call your pharmacy*  Follow-Up: At [facility], you and your health needs are our priority.  As part of our continuing mission to provide you with exceptional heart care, we have created designated Provider Care Teams.  These Care Teams include your primary Cardiologist (physician) and Advanced Practice Providers (APPs -  Physician Assistants and Nurse Practitioners) who all work together to provide you with the care you need, when you need it.  We recommend signing up for the patient portal called "MyChart".  Sign up information is provided on this After Visit Summary.  MyChart is used to connect with patients for Virtual Visits (Telemedicine).  Patients are able to view lab/test results, encounter notes, upcoming appointments, etc.  Non-urgent messages can be sent to your provider as well.   To learn more about what you can do with MyChart, go to [email or web address].    Your next appointment:   6 month(s)  The format for your next appointment:   In Person  Provider:   James Hochrein, MD   Thank you for choosing [facility]!!     

## 2021-03-22 ENCOUNTER — Other Ambulatory Visit: Payer: Self-pay

## 2021-03-22 ENCOUNTER — Ambulatory Visit (INDEPENDENT_AMBULATORY_CARE_PROVIDER_SITE_OTHER): Payer: Medicare Other | Admitting: *Deleted

## 2021-03-22 DIAGNOSIS — E538 Deficiency of other specified B group vitamins: Secondary | ICD-10-CM | POA: Diagnosis not present

## 2021-03-22 MED ORDER — CYANOCOBALAMIN 1000 MCG/ML IJ SOLN
1000.0000 ug | INTRAMUSCULAR | Status: AC
  Administered 2021-03-22 – 2022-03-08 (×12): 1000 ug via INTRAMUSCULAR

## 2021-03-22 NOTE — Progress Notes (Signed)
Pt tolerated B12 well 

## 2021-03-23 ENCOUNTER — Ambulatory Visit: Payer: Medicare Other

## 2021-03-23 DIAGNOSIS — D225 Melanocytic nevi of trunk: Secondary | ICD-10-CM | POA: Diagnosis not present

## 2021-03-23 DIAGNOSIS — L723 Sebaceous cyst: Secondary | ICD-10-CM | POA: Diagnosis not present

## 2021-03-23 DIAGNOSIS — D1801 Hemangioma of skin and subcutaneous tissue: Secondary | ICD-10-CM | POA: Diagnosis not present

## 2021-03-23 DIAGNOSIS — L720 Epidermal cyst: Secondary | ICD-10-CM | POA: Diagnosis not present

## 2021-03-23 DIAGNOSIS — L821 Other seborrheic keratosis: Secondary | ICD-10-CM | POA: Diagnosis not present

## 2021-03-23 DIAGNOSIS — L309 Dermatitis, unspecified: Secondary | ICD-10-CM | POA: Diagnosis not present

## 2021-04-11 DIAGNOSIS — L821 Other seborrheic keratosis: Secondary | ICD-10-CM | POA: Diagnosis not present

## 2021-04-11 DIAGNOSIS — L723 Sebaceous cyst: Secondary | ICD-10-CM | POA: Diagnosis not present

## 2021-04-21 ENCOUNTER — Encounter: Payer: Self-pay | Admitting: Neurology

## 2021-04-22 ENCOUNTER — Ambulatory Visit (INDEPENDENT_AMBULATORY_CARE_PROVIDER_SITE_OTHER): Payer: Medicare Other | Admitting: *Deleted

## 2021-04-22 ENCOUNTER — Other Ambulatory Visit: Payer: Self-pay

## 2021-04-22 DIAGNOSIS — E538 Deficiency of other specified B group vitamins: Secondary | ICD-10-CM | POA: Diagnosis not present

## 2021-04-22 NOTE — Progress Notes (Signed)
Vitamin b12 injection given and patient tolerated well.  

## 2021-05-24 ENCOUNTER — Other Ambulatory Visit: Payer: Self-pay

## 2021-05-24 ENCOUNTER — Ambulatory Visit (INDEPENDENT_AMBULATORY_CARE_PROVIDER_SITE_OTHER): Payer: Medicare Other

## 2021-05-24 DIAGNOSIS — E538 Deficiency of other specified B group vitamins: Secondary | ICD-10-CM

## 2021-05-24 NOTE — Progress Notes (Signed)
B12 injection given to patient and tolerated well.  

## 2021-06-01 NOTE — Progress Notes (Signed)
NEUROLOGY CONSULTAION NOTE  Rose Martinez MRN: [id number] DOB: 12-31-1934  Referring provider: Vicie Mutters, MD Primary care provider: Caryl Pina, MD  Reason for consult:  ataxia  Assessment/Plan:    Unsteady gait - I do not appreciate any abnormalities on neurologic exam, which is reassuring.  I think an intracranial abnormality would be unlikely as it is not a persistent problem.  .   Fatigue/lower extremity weakness - no objective findings on exam - B12 and D are normal.  No TSH check this year but levels for the last 5 years since last year have been normal.  1.In light of normal exam, will defer imaging of the brain.  If symptoms of balance get worse, then would reconsider 2.For evaluation of lower extremity "heaviness", offered NCV-EMG of lower extremities.  Patient declines now but will contact the office if she changes her mind. 3.  When she follows up with her PCP, will ask about repeating TSH. 4.  Otherwise, follow up as needed.   Subjective:  Rose Martinez is an 86 year old right-handed female who presents for ataxia.  History supplemented by referring provider's note.  For about a year, she will sometimes feel herself swaying to either one side or the other while walking.  It is not all of the time.  She does have chronic low-grade external otitis and intermittent positional vertigo, but denies associated dizziness with these events.  No associated double vision or headache.  However, she says that occasionally her head feels full.  She is very active and is on her feet all of the time.  She also reports fatigue.  On occasion, her legs feel heavy when she is walks.  No associated back or leg pain.  She is currently not taking a statin.  Her big toe on the right feels numb, but otherwise no lower extremity sensory deficits.  She takes B12 shots and level is normal.  Vitamin D is normal.      PAST MEDICAL HISTORY: Past Medical History:  Diagnosis Date   Anxiety     Hyperlipidemia    Mild   Osteoporosis    Palpitations     PAST SURGICAL HISTORY: Past Surgical History:  Procedure Laterality Date   CYSTECTOMY     Breast   EYE SURGERY     OOPHORECTOMY      MEDICATIONS: Current Outpatient Medications on File Prior to Visit  Medication Sig Dispense Refill   ALPRAZolam (XANAX) 0.25 MG tablet TAKE (1/2) TABLET UP TO THREE TIMES DAILY. 45 tablet 1   betamethasone dipropionate 0.05 % lotion Apply topically as needed.     betamethasone, augmented, (DIPROLENE) 0.05 % lotion Apply 1 application topically daily.     cholecalciferol (VITAMIN D) 1000 UNITS tablet Take 2,000 Units by mouth daily.      CIPRODEX OTIC suspension Place 4 drops into the left ear in the morning, at noon, in the evening, and at bedtime.     fluticasone (FLONASE) 50 MCG/ACT nasal spray SPRAY 1 SPRAY IN EACH NOSTRIL ONCE DAILY. 16 g 11   loratadine (CLARITIN) 10 MG tablet Take 10 mg by mouth daily.      montelukast (SINGULAIR) 10 MG tablet TAKE (1) TABLET DAILY AS DIRECTED. 30 tablet 0   Probiotic Product (ALIGN PO) Take 1 capsule by mouth daily.     pseudoephedrine-guaifenesin (MUCINEX D) 60-600 MG 12 hr tablet Take 1 table 1-2 times daily (Patient taking differently: as needed. Take 1 table 1-2 times daily) 30  tablet 11   Current Facility-Administered Medications on File Prior to Visit  Medication Dose Route Frequency Provider Last Rate Last Admin   cyanocobalamin ((VITAMIN B-12)) injection 1,000 mcg  1,000 mcg Intramuscular Q30 days Dettinger, Fransisca Kaufmann, MD   1,000 mcg at 05/24/21 1500    ALLERGIES: Allergies  Allergen Reactions   Statins     myalgias   Cephalexin Other (See Comments)    Pt does not remember    Penicillins Other (See Comments)    Pt's mother was severly allergic, so she has never taken PCN.    FAMILY HISTORY: Family History  Problem Relation Age of Onset   Heart attack Father    Other Mother        ployarthritis Nodosa    Objective:  Blood  pressure (!) 107/54, pulse 60, height 5\' 5"  (1.651 m), weight 133 lb 6.4 oz (60.5 kg), SpO2 96 %. General: No acute distress.  Patient appears well-groomed.   Head:  Normocephalic/atraumatic Eyes:  fundi examined but not visualized Neck: supple, no paraspinal tenderness, full range of motion Back: No paraspinal tenderness Heart: regular rate and rhythm Lungs: Clear to auscultation bilaterally. Vascular: No carotid bruits. Neurological Exam: Mental status: alert and oriented to person, place, and time, speech fluent and not dysarthric, language intact. Cranial nerves: CN I: not tested CN II: pupils equal, round and reactive to light, visual fields intact CN III, IV, VI:  full range of motion, no nystagmus, no ptosis CN V: facial sensation intact. CN VII: upper and lower face symmetric CN VIII: hearing intact CN IX, X: gag intact, uvula midline CN XI: sternocleidomastoid and trapezius muscles intact CN XII: tongue midline Bulk & Tone: normal, no fasciculations. Motor:  muscle strength 5/5 throughout Sensation:  Pinprick, temperature and vibratory sensation intact. Deep Tendon Reflexes:  2+ throughout,  toes downgoing.   Finger to nose testing:  Without dysmetria.   Heel to shin:  Without dysmetria.   Gait:  Normal station and stride.  Romberg negative.  Metta Clines, DO  CC:  Rose Pina, MD  Rose Mutters, MD

## 2021-06-02 ENCOUNTER — Other Ambulatory Visit: Payer: Self-pay

## 2021-06-02 ENCOUNTER — Encounter: Payer: Self-pay | Admitting: Neurology

## 2021-06-02 ENCOUNTER — Ambulatory Visit (INDEPENDENT_AMBULATORY_CARE_PROVIDER_SITE_OTHER): Payer: Medicare Other | Admitting: Neurology

## 2021-06-02 VITALS — BP 107/54 | HR 60 | Ht 65.0 in | Wt 133.4 lb

## 2021-06-02 DIAGNOSIS — R29898 Other symptoms and signs involving the musculoskeletal system: Secondary | ICD-10-CM | POA: Diagnosis not present

## 2021-06-02 DIAGNOSIS — R2681 Unsteadiness on feet: Secondary | ICD-10-CM

## 2021-06-02 NOTE — Patient Instructions (Signed)
I don't appreciate any abnormalities on neurologic exam.  I don't think further workup is warranted at this time. If balance gets worse, then definitely follow up and I can re-evaluate  If you would like a nerve conduction study of your legs to look for nerve or muscle problem causing leg heaviness, contact me and we can schedule you

## 2021-06-03 ENCOUNTER — Encounter: Payer: Self-pay | Admitting: Neurology

## 2021-06-06 DIAGNOSIS — H5310 Unspecified subjective visual disturbances: Secondary | ICD-10-CM | POA: Diagnosis not present

## 2021-06-15 ENCOUNTER — Other Ambulatory Visit: Payer: Self-pay | Admitting: Family Medicine

## 2021-06-23 DIAGNOSIS — H6123 Impacted cerumen, bilateral: Secondary | ICD-10-CM | POA: Diagnosis not present

## 2021-06-23 DIAGNOSIS — Z889 Allergy status to unspecified drugs, medicaments and biological substances status: Secondary | ICD-10-CM | POA: Diagnosis not present

## 2021-06-23 DIAGNOSIS — H608X2 Other otitis externa, left ear: Secondary | ICD-10-CM | POA: Diagnosis not present

## 2021-06-24 ENCOUNTER — Ambulatory Visit (INDEPENDENT_AMBULATORY_CARE_PROVIDER_SITE_OTHER): Payer: Medicare Other

## 2021-06-24 ENCOUNTER — Other Ambulatory Visit: Payer: Self-pay

## 2021-06-24 DIAGNOSIS — E538 Deficiency of other specified B group vitamins: Secondary | ICD-10-CM

## 2021-06-24 NOTE — Progress Notes (Signed)
Cyanocobalamin injection given to left deltoid.  Patient tolerated well. 

## 2021-07-19 ENCOUNTER — Other Ambulatory Visit: Payer: Self-pay | Admitting: Family Medicine

## 2021-07-28 ENCOUNTER — Other Ambulatory Visit: Payer: Self-pay

## 2021-07-28 ENCOUNTER — Encounter: Payer: Self-pay | Admitting: Family Medicine

## 2021-07-28 ENCOUNTER — Ambulatory Visit (INDEPENDENT_AMBULATORY_CARE_PROVIDER_SITE_OTHER): Payer: Medicare Other | Admitting: Family Medicine

## 2021-07-28 VITALS — BP 110/66 | HR 60 | Ht 65.0 in | Wt 130.0 lb

## 2021-07-28 DIAGNOSIS — R5383 Other fatigue: Secondary | ICD-10-CM | POA: Diagnosis not present

## 2021-07-28 DIAGNOSIS — M810 Age-related osteoporosis without current pathological fracture: Secondary | ICD-10-CM

## 2021-07-28 DIAGNOSIS — E538 Deficiency of other specified B group vitamins: Secondary | ICD-10-CM

## 2021-07-28 DIAGNOSIS — F4323 Adjustment disorder with mixed anxiety and depressed mood: Secondary | ICD-10-CM

## 2021-07-28 DIAGNOSIS — E782 Mixed hyperlipidemia: Secondary | ICD-10-CM

## 2021-07-28 DIAGNOSIS — N3281 Overactive bladder: Secondary | ICD-10-CM

## 2021-07-28 MED ORDER — MIRABEGRON ER 25 MG PO TB24: 25.0000 mg | ORAL_TABLET | Freq: Every day | ORAL | 3 refills | Status: DC

## 2021-07-28 NOTE — Progress Notes (Signed)
BP 110/66   Pulse 60   Ht 5' 5"  (1.651 m)   Wt 130 lb (59 kg)   SpO2 98%   BMI 21.63 kg/m    Subjective:   Patient ID: Rose Martinez, female    DOB: January 23, 1935, 86 y.o.   MRN: [id number]  HPI: Rose Martinez is a 86 y.o. female presenting on 07/28/2021 for Medical Management of Chronic Issues and Hyperlipidemia   HPI Hyperlipidemia Patient is coming in for recheck of his hyperlipidemia. The patient is currently taking no medication currently has been doing diet and exercise, will check levels.. They deny any issues with myalgias or history of liver damage from it. They deny any focal numbness or weakness or chest pain.   Osteoporosis/osteopenia Fractures or history of fracture: None Medication: Calcium and vitamin D Duration of treatment: Quite many years Last bone density scan: 2016 Last T score: 2016, wants to delay, does not time today  Patient has B12 deficiency and takes injections and has been doing well without.  Patient has overactive bladder and takes Myrbetriq and denies any major issues with it and feels like it is helping her significantly.  Patient has some dizziness and balance issues for which she has seen both ENT and neurology, it sounds like they have deemed that either labyrinthitis or vertigo and not ataxia.  Recommended meclizine and then she continues to take her allergy medicines, its worse in the springtime especially with sinus pressure as well during the therapy year.  Recommend she start her allergy medicines up around that time a year  Patient says she has been having some a.m. fatigue but denies it continuing any further throughout the day.  She just feels like when she wakes up she does not feel refreshed.  Been having bilateral breast enlargement recently, she thinks she is been gaining weight but she was just concerned with enlargement.  She denies any specific nodules or drainage or discharge.  Relevant past medical, surgical, family and social  history reviewed and updated as indicated. Interim medical history since our last visit reviewed. Allergies and medications reviewed and updated.  Review of Systems  Constitutional:  Negative for chills and fever.  Eyes:  Negative for visual disturbance.  Respiratory:  Negative for chest tightness and shortness of breath.   Cardiovascular:  Negative for chest pain and leg swelling.  Genitourinary:  Negative for difficulty urinating and dysuria.  Musculoskeletal:  Negative for arthralgias, back pain and gait problem.  Skin:  Negative for rash.  Neurological:  Positive for dizziness. Negative for light-headedness and headaches.  Psychiatric/Behavioral:  Negative for agitation and behavioral problems.   All other systems reviewed and are negative.  Per HPI unless specifically indicated above   Allergies as of 07/28/2021       Reactions   Statins    myalgias   Cephalexin Other (See Comments)   Pt does not remember   Penicillins Other (See Comments)   Pt's mother was severly allergic, so she has never taken PCN.        Medication List        Accurate as of July 28, 2021  9:48 AM. If you have any questions, ask your nurse or doctor.          ALIGN PO Take 1 capsule by mouth daily.   ALPRAZolam 0.25 MG tablet Commonly known as: XANAX TAKE (1/2) TABLET UP TO THREE TIMES DAILY.   betamethasone (augmented) 0.05 % lotion Commonly known as: DIPROLENE Apply  1 application topically daily.   betamethasone dipropionate 0.05 % lotion Apply topically as needed.   cholecalciferol 1000 units tablet Commonly known as: VITAMIN D Take 2,000 Units by mouth daily.   Ciprodex OTIC suspension Generic drug: ciprofloxacin-dexamethasone Place 4 drops into the left ear in the morning, at noon, in the evening, and at bedtime.   fluticasone 50 MCG/ACT nasal spray Commonly known as: FLONASE SPRAY 1 SPRAY IN EACH NOSTRIL ONCE DAILY.   loratadine 10 MG tablet Commonly known as:  CLARITIN Take 10 mg by mouth daily.   mirabegron ER 25 MG Tb24 tablet Commonly known as: Myrbetriq Take 1 tablet (25 mg total) by mouth daily. What changed: how much to take Changed by: Fransisca Kaufmann Torrance Frech, MD   montelukast 10 MG tablet Commonly known as: SINGULAIR TAKE (1) TABLET DAILY AS DIRECTED.   pseudoephedrine-guaifenesin 60-600 MG 12 hr tablet Commonly known as: MUCINEX D Take 1 table 1-2 times daily What changed:  when to take this reasons to take this         Objective:   BP 110/66   Pulse 60   Ht 5' 5"  (1.651 m)   Wt 130 lb (59 kg)   SpO2 98%   BMI 21.63 kg/m   Wt Readings from Last 3 Encounters:  07/28/21 130 lb (59 kg)  06/02/21 133 lb 6.4 oz (60.5 kg)  03/16/21 133 lb (60.3 kg)    Physical Exam Vitals and nursing note reviewed.  Constitutional:      General: She is not in acute distress.    Appearance: She is well-developed. She is not diaphoretic.  Eyes:     Conjunctiva/sclera: Conjunctivae normal.  Cardiovascular:     Rate and Rhythm: Normal rate and regular rhythm.     Heart sounds: Normal heart sounds. No murmur heard. Pulmonary:     Effort: Pulmonary effort is normal. No respiratory distress.     Breath sounds: Normal breath sounds. No wheezing.  Musculoskeletal:        General: No tenderness. Normal range of motion.  Skin:    General: Skin is warm and dry.     Findings: No rash.  Neurological:     Mental Status: She is alert and oriented to person, place, and time.     Coordination: Coordination normal.  Psychiatric:        Behavior: Behavior normal.      Assessment & Plan:   Problem List Items Addressed This Visit       Musculoskeletal and Integument   Osteoporosis   Relevant Orders   CBC with Differential/Platelet   CMP14+EGFR   VITAMIN D 25 Hydroxy (Vit-D Deficiency, Fractures)     Other   Adjustment disorder with mixed anxiety and depressed mood - Primary   Hyperlipidemia   Relevant Orders   Lipid panel   Other  Visit Diagnoses     OAB (overactive bladder)       Relevant Medications   mirabegron ER (MYRBETRIQ) 25 MG TB24 tablet   Other fatigue       Relevant Orders   Thyroid Panel With TSH       Continue current medications, will do blood work.  Continue Myrbetriq's, seems to be doing well on it. Follow up plan: Return in about 6 months (around 01/28/2022), or if symptoms worsen or fail to improve, for hyperlipidemia.  Counseling provided for all of the vaccine components Orders Placed This Encounter  Procedures   CBC with Differential/Platelet   CMP14+EGFR   Lipid  panel   VITAMIN D 25 Hydroxy (Vit-D Deficiency, Fractures)   Thyroid Panel With TSH    Caryl Pina, MD [facility] 07/28/2021, 9:48 AM

## 2021-07-29 LAB — LIPID PANEL
Chol/HDL Ratio: 3.9 ratio (ref 0.0–4.4)
Cholesterol, Total: 238 mg/dL — ABNORMAL HIGH (ref 100–199)
HDL: 61 mg/dL (ref >39)
LDL Chol Calc (NIH): 161 mg/dL — ABNORMAL HIGH (ref 0–99)
Triglycerides: 93 mg/dL (ref 0–149)
VLDL Cholesterol Cal: 16 mg/dL (ref 5–40)

## 2021-07-29 LAB — CMP14+EGFR
ALT: 10 IU/L (ref 0–32)
AST: 11 IU/L (ref 0–40)
Albumin/Globulin Ratio: 2.1 (ref 1.2–2.2)
Albumin: 4.5 g/dL (ref 3.6–4.6)
Alkaline Phosphatase: 75 IU/L (ref 44–121)
BUN/Creatinine Ratio: 21 (ref 12–28)
BUN: 13 mg/dL (ref 8–27)
Bilirubin Total: 0.4 mg/dL (ref 0.0–1.2)
CO2: 23 mmol/L (ref 20–29)
Calcium: 9.2 mg/dL (ref 8.7–10.3)
Chloride: 106 mmol/L (ref 96–106)
Creatinine, Ser: 0.61 mg/dL (ref 0.57–1.00)
Globulin, Total: 2.1 g/dL (ref 1.5–4.5)
Glucose: 95 mg/dL (ref 65–99)
Potassium: 4.7 mmol/L (ref 3.5–5.2)
Sodium: 143 mmol/L (ref 134–144)
Total Protein: 6.6 g/dL (ref 6.0–8.5)
eGFR: 89 mL/min/{1.73_m2} (ref >59)

## 2021-07-29 LAB — CBC WITH DIFFERENTIAL/PLATELET
Basophils Absolute: 0 10*3/uL (ref 0.0–0.2)
Basos: 1 %
EOS (ABSOLUTE): 0.1 10*3/uL (ref 0.0–0.4)
Eos: 1 %
Hematocrit: 41.3 % (ref 34.0–46.6)
Hemoglobin: 13.9 g/dL (ref 11.1–15.9)
Immature Grans (Abs): 0 10*3/uL (ref 0.0–0.1)
Immature Granulocytes: 0 %
Lymphocytes Absolute: 1.6 10*3/uL (ref 0.7–3.1)
Lymphs: 30 %
MCH: 33 pg (ref 26.6–33.0)
MCHC: 33.7 g/dL (ref 31.5–35.7)
MCV: 98 fL — ABNORMAL HIGH (ref 79–97)
Monocytes Absolute: 0.5 10*3/uL (ref 0.1–0.9)
Monocytes: 9 %
Neutrophils Absolute: 3.2 10*3/uL (ref 1.4–7.0)
Neutrophils: 59 %
Platelets: 166 10*3/uL (ref 150–450)
RBC: 4.21 x10E6/uL (ref 3.77–5.28)
RDW: 11.7 % (ref 11.7–15.4)
WBC: 5.5 10*3/uL (ref 3.4–10.8)

## 2021-07-29 LAB — VITAMIN D 25 HYDROXY (VIT D DEFICIENCY, FRACTURES): Vit D, 25-Hydroxy: 56.3 ng/mL (ref 30.0–100.0)

## 2021-07-29 LAB — THYROID PANEL WITH TSH
Free Thyroxine Index: 1.7 (ref 1.2–4.9)
T3 Uptake Ratio: 25 % (ref 24–39)
T4, Total: 6.7 ug/dL (ref 4.5–12.0)
TSH: 1.46 u[IU]/mL (ref 0.450–4.500)

## 2021-08-09 ENCOUNTER — Other Ambulatory Visit: Payer: Self-pay

## 2021-08-09 MED ORDER — ROSUVASTATIN CALCIUM 5 MG PO TABS: 5.0000 mg | ORAL_TABLET | ORAL | 0 refills | Status: DC

## 2021-08-12 ENCOUNTER — Ambulatory Visit: Payer: Medicare Other | Admitting: Family Medicine

## 2021-08-29 ENCOUNTER — Ambulatory Visit (INDEPENDENT_AMBULATORY_CARE_PROVIDER_SITE_OTHER): Payer: Medicare Other

## 2021-08-29 ENCOUNTER — Other Ambulatory Visit: Payer: Self-pay

## 2021-08-29 DIAGNOSIS — E538 Deficiency of other specified B group vitamins: Secondary | ICD-10-CM | POA: Diagnosis not present

## 2021-08-29 NOTE — Progress Notes (Signed)
Cyanocobalamin injection given to left deltoid per patient request.  Patient tolerated well.

## 2021-09-04 NOTE — Progress Notes (Deleted)
Cardiology Office Note   Date:  09/04/2021   ID:  [name], DOB 05/15/1935, MRN [id number]  PCP:  Dettinger, Fransisca Kaufmann, MD  Cardiologist:   None   No chief complaint on file.     History of Present Illness: Rose Martinez is a 86 y.o. female who presents for followup of palpitations.  ***   ***  She still has these occasionally but they are not particularly problematic.  She had some leg heaviness.  She has been told she has some ataxia.  She does not have any acute cardiac complaints.  She still tries to stay active around her property.  She denies any new shortness of breath, PND or orthopnea.  She has had no presyncope or syncope.  She has had no edema.  Of note I tried to increase her statin at the last visit because she is not at target cholesterol but go from 2-3 times a week of her Crestor.  She is only taking it once in a great while.   Past Medical History:  Diagnosis Date   Anxiety    Hyperlipidemia    Mild   Osteoporosis    Palpitations     Past Surgical History:  Procedure Laterality Date   CYSTECTOMY     Breast   EYE SURGERY     OOPHORECTOMY       Current Outpatient Medications  Medication Sig Dispense Refill   ALPRAZolam (XANAX) 0.25 MG tablet TAKE (1/2) TABLET UP TO THREE TIMES DAILY. 45 tablet 1   betamethasone dipropionate 0.05 % lotion Apply topically as needed.     betamethasone, augmented, (DIPROLENE) 0.05 % lotion Apply 1 application topically daily.     cholecalciferol (VITAMIN D) 1000 UNITS tablet Take 2,000 Units by mouth daily.      CIPRODEX OTIC suspension Place 4 drops into the left ear in the morning, at noon, in the evening, and at bedtime.     fluticasone (FLONASE) 50 MCG/ACT nasal spray SPRAY 1 SPRAY IN EACH NOSTRIL ONCE DAILY. 16 g 11   loratadine (CLARITIN) 10 MG tablet Take 10 mg by mouth daily.      mirabegron ER (MYRBETRIQ) 25 MG TB24 tablet Take 1 tablet (25 mg total) by mouth daily. 90 tablet 3   montelukast  (SINGULAIR) 10 MG tablet TAKE (1) TABLET DAILY AS DIRECTED. 30 tablet 0   Probiotic Product (ALIGN PO) Take 1 capsule by mouth daily.     pseudoephedrine-guaifenesin (MUCINEX D) 60-600 MG 12 hr tablet Take 1 table 1-2 times daily (Patient taking differently: as needed. Take 1 table 1-2 times daily) 30 tablet 11   rosuvastatin (CRESTOR) 5 MG tablet Take 1 tablet (5 mg total) by mouth every other day. 90 tablet 0   Current Facility-Administered Medications  Medication Dose Route Frequency Provider Last Rate Last Admin   cyanocobalamin ((VITAMIN B-12)) injection 1,000 mcg  1,000 mcg Intramuscular Q30 days Dettinger, Fransisca Kaufmann, MD   1,000 mcg at 08/29/21 1507    Allergies:   Statins, Cephalexin, and Penicillins   ROS:  Please see the history of present illness.   Otherwise, review of systems are positive for ***.   All other systems are reviewed and negative.    PHYSICAL EXAM: VS:  There were no vitals taken for this visit. , BMI There is no height or weight on file to calculate BMI. GENERAL:  Well appearing NECK:  No jugular venous distention, waveform within normal limits, carotid upstroke brisk and symmetric,  no bruits, no thyromegaly LUNGS:  Clear to auscultation bilaterally CHEST:  Unremarkable HEART:  PMI not displaced or sustained,S1 and S2 within normal limits, no S3, no S4, no clicks, no rubs, *** murmurs ABD:  Flat, positive bowel sounds normal in frequency in pitch, no bruits, no rebound, no guarding, no midline pulsatile mass, no hepatomegaly, no splenomegaly EXT:  2 plus pulses throughout, no edema, no cyanosis no clubbing    ***GENERAL:  Well appearing NECK:  No jugular venous distention, waveform within normal limits, carotid upstroke brisk and symmetric, no bruits, no thyromegaly LUNGS:  Clear to auscultation bilaterally CHEST:  Unremarkable HEART:  PMI not displaced or sustained,S1 and S2 within normal limits, no S3, no S4, no clicks, no rubs, no murmurs ABD:  Flat,  positive bowel sounds normal in frequency in pitch, no bruits, no rebound, no guarding, no midline pulsatile mass, no hepatomegaly, no splenomegaly EXT:  2 plus pulses throughout, no edema, no cyanosis no clubbing   EKG:  EKG is *** ordered today. ***   Recent Labs: 07/28/2021: ALT 10; BUN 13; Creatinine, Ser 0.61; Hemoglobin 13.9; Platelets 166; Potassium 4.7; Sodium 143; TSH 1.460    Lipid Panel    Component Value Date/Time   CHOL 238 (H) 07/28/2021 0948   TRIG 93 07/28/2021 0948   TRIG 90 03/30/2017 0811   HDL 61 07/28/2021 0948   HDL 71 03/30/2017 0811   CHOLHDL 3.9 07/28/2021 0948   LDLCALC 161 (H) 07/28/2021 0948   LDLCALC 126 (H) 05/06/2014 0803      Wt Readings from Last 3 Encounters:  07/28/21 130 lb (59 kg)  06/02/21 133 lb 6.4 oz (60.5 kg)  03/16/21 133 lb (60.3 kg)      Other studies Reviewed: Additional studies/ records that were reviewed today include: ***. Review of the above records demonstrates:  Please see elsewhere in the note.     ASSESSMENT AND PLAN:  PALPITATION:    ***  She is not particularly bothered by these.  No change in therapy.   DYSLIPIDEMIA:   ***   She is not able to take a statin.  I have removed this from her list completely.   Current medicines are reviewed at length with the patient today.  The patient does not have concerns regarding medicines.  The following changes have been made:  ***  Labs/ tests ordered today include:    ***  No orders of the defined types were placed in this encounter.    Disposition:   FU with me in *** months per her request   Signed, Minus Breeding, MD  09/04/2021 7:09 PM    [place]

## 2021-09-07 ENCOUNTER — Ambulatory Visit: Payer: Medicare Other | Admitting: Cardiology

## 2021-09-07 DIAGNOSIS — E785 Hyperlipidemia, unspecified: Secondary | ICD-10-CM

## 2021-09-07 DIAGNOSIS — R002 Palpitations: Secondary | ICD-10-CM

## 2021-09-14 ENCOUNTER — Other Ambulatory Visit: Payer: Self-pay | Admitting: Family Medicine

## 2021-09-21 DIAGNOSIS — L814 Other melanin hyperpigmentation: Secondary | ICD-10-CM | POA: Diagnosis not present

## 2021-09-21 DIAGNOSIS — L853 Xerosis cutis: Secondary | ICD-10-CM | POA: Diagnosis not present

## 2021-09-21 DIAGNOSIS — L821 Other seborrheic keratosis: Secondary | ICD-10-CM | POA: Diagnosis not present

## 2021-09-21 DIAGNOSIS — L218 Other seborrheic dermatitis: Secondary | ICD-10-CM | POA: Diagnosis not present

## 2021-09-21 DIAGNOSIS — D1801 Hemangioma of skin and subcutaneous tissue: Secondary | ICD-10-CM | POA: Diagnosis not present

## 2021-09-21 DIAGNOSIS — L570 Actinic keratosis: Secondary | ICD-10-CM | POA: Diagnosis not present

## 2021-09-22 ENCOUNTER — Telehealth: Payer: Self-pay | Admitting: Family Medicine

## 2021-09-23 MED ORDER — PROPRANOLOL HCL 20 MG PO TABS: 20.0000 mg | ORAL_TABLET | Freq: Every day | ORAL | 0 refills | Status: DC | PRN

## 2021-09-23 NOTE — Telephone Encounter (Signed)
Pt keeps this on hand - just has not needed one - #30 resent to [facility] for pt to have on hand - will follow up with Cardio

## 2021-09-23 NOTE — Addendum Note (Signed)
Addended by: Zannie Cove on: 09/23/2021 01:43 PM   Modules accepted: Orders

## 2021-09-28 ENCOUNTER — Ambulatory Visit: Payer: Medicare Other

## 2021-09-30 ENCOUNTER — Other Ambulatory Visit: Payer: Self-pay

## 2021-09-30 ENCOUNTER — Ambulatory Visit (INDEPENDENT_AMBULATORY_CARE_PROVIDER_SITE_OTHER): Payer: Medicare Other

## 2021-09-30 DIAGNOSIS — E538 Deficiency of other specified B group vitamins: Secondary | ICD-10-CM | POA: Diagnosis not present

## 2021-09-30 NOTE — Progress Notes (Signed)
Cyanocobalamin injection given to right arm.  Patient tolerated well.

## 2021-11-01 ENCOUNTER — Other Ambulatory Visit: Payer: Self-pay

## 2021-11-01 ENCOUNTER — Ambulatory Visit: Payer: Medicare Other

## 2021-11-01 DIAGNOSIS — E538 Deficiency of other specified B group vitamins: Secondary | ICD-10-CM

## 2021-11-14 NOTE — Progress Notes (Deleted)
Cardiology Office Note   Date:  11/14/2021   ID:  Rose Martinez, DOB 03-Jul-1935, MRN [id number]  PCP:  Dettinger, Fransisca Kaufmann, MD  Cardiologist:   None   No chief complaint on file.     History of Present Illness: Rose Martinez is a 86 y.o. female who presents for followup of palpitations.  ***   *** She still has these occasionally but they are not particularly problematic.  She had some leg heaviness.  She has been told she has some ataxia.  She does not have any acute cardiac complaints.  She still tries to stay active around her property.  She denies any new shortness of breath, PND or orthopnea.  She has had no presyncope or syncope.  She has had no edema.  Of note I tried to increase her statin at the last visit because she is not at target cholesterol but go from 2-3 times a week of her Crestor.  She is only taking it once in a great while.   Past Medical History:  Diagnosis Date   Anxiety    Hyperlipidemia    Mild   Osteoporosis    Palpitations     Past Surgical History:  Procedure Laterality Date   CYSTECTOMY     Breast   EYE SURGERY     OOPHORECTOMY       Current Outpatient Medications  Medication Sig Dispense Refill   ALPRAZolam (XANAX) 0.25 MG tablet TAKE (1/2) TABLET UP TO THREE TIMES DAILY. 45 tablet 1   betamethasone dipropionate 0.05 % lotion Apply topically as needed.     betamethasone, augmented, (DIPROLENE) 0.05 % lotion Apply 1 application topically daily.     cholecalciferol (VITAMIN D) 1000 UNITS tablet Take 2,000 Units by mouth daily.      CIPRODEX OTIC suspension Place 4 drops into the left ear in the morning, at noon, in the evening, and at bedtime.     fluticasone (FLONASE) 50 MCG/ACT nasal spray 1 SPRAY IN EACH NOSTRIL ONCE DAILY. 16 g 11   loratadine (CLARITIN) 10 MG tablet Take 10 mg by mouth daily.      mirabegron ER (MYRBETRIQ) 25 MG TB24 tablet Take 1 tablet (25 mg total) by mouth daily. 90 tablet 3   montelukast (SINGULAIR) 10  MG tablet TAKE (1) TABLET DAILY AS DIRECTED. 30 tablet 0   Probiotic Product (ALIGN PO) Take 1 capsule by mouth daily.     propranolol (INDERAL) 20 MG tablet Take 1 tablet (20 mg total) by mouth daily as needed. 30 tablet 0   pseudoephedrine-guaifenesin (MUCINEX D) 60-600 MG 12 hr tablet Take 1 table 1-2 times daily (Patient taking differently: as needed. Take 1 table 1-2 times daily) 30 tablet 11   rosuvastatin (CRESTOR) 5 MG tablet Take 1 tablet (5 mg total) by mouth every other day. 90 tablet 0   Current Facility-Administered Medications  Medication Dose Route Frequency Provider Last Rate Last Admin   cyanocobalamin ((VITAMIN B-12)) injection 1,000 mcg  1,000 mcg Intramuscular Q30 days Dettinger, Fransisca Kaufmann, MD   1,000 mcg at 11/01/21 1603    Allergies:   Statins, Cephalexin, and Penicillins   ROS:  Please see the history of present illness.   Otherwise, review of systems are positive for ***.   All other systems are reviewed and negative.    PHYSICAL EXAM: VS:  There were no vitals taken for this visit. , BMI There is no height or weight on file to calculate BMI.  GENERAL:  Well appearing NECK:  No jugular venous distention, waveform within normal limits, carotid upstroke brisk and symmetric, no bruits, no thyromegaly LUNGS:  Clear to auscultation bilaterally CHEST:  Unremarkable HEART:  PMI not displaced or sustained,S1 and S2 within normal limits, no S3, no S4, no clicks, no rubs, *** murmurs ABD:  Flat, positive bowel sounds normal in frequency in pitch, no bruits, no rebound, no guarding, no midline pulsatile mass, no hepatomegaly, no splenomegaly EXT:  2 plus pulses throughout, no edema, no cyanosis no clubbing    ***GENERAL:  Well appearing NECK:  No jugular venous distention, waveform within normal limits, carotid upstroke brisk and symmetric, no bruits, no thyromegaly LUNGS:  Clear to auscultation bilaterally CHEST:  Unremarkable HEART:  PMI not displaced or sustained,S1 and  S2 within normal limits, no S3, no S4, no clicks, no rubs, no murmurs ABD:  Flat, positive bowel sounds normal in frequency in pitch, no bruits, no rebound, no guarding, no midline pulsatile mass, no hepatomegaly, no splenomegaly EXT:  2 plus pulses throughout, no edema, no cyanosis no clubbing   EKG:  EKG is *** ordered today. ***   Recent Labs: 07/28/2021: ALT 10; BUN 13; Creatinine, Ser 0.61; Hemoglobin 13.9; Platelets 166; Potassium 4.7; Sodium 143; TSH 1.460    Lipid Panel    Component Value Date/Time   CHOL 238 (H) 07/28/2021 0948   TRIG 93 07/28/2021 0948   TRIG 90 03/30/2017 0811   HDL 61 07/28/2021 0948   HDL 71 03/30/2017 0811   CHOLHDL 3.9 07/28/2021 0948   LDLCALC 161 (H) 07/28/2021 0948   LDLCALC 126 (H) 05/06/2014 0803      Wt Readings from Last 3 Encounters:  07/28/21 130 lb (59 kg)  06/02/21 133 lb 6.4 oz (60.5 kg)  03/16/21 133 lb (60.3 kg)      Other studies Reviewed: Additional studies/ records that were reviewed today include: ***. Review of the above records demonstrates:  Please see elsewhere in the note.     ASSESSMENT AND PLAN:  PALPITATION:     ***  She is not particularly bothered by these.  No change in therapy.   DYSLIPIDEMIA:   She is not able to take a statin.  *** I have removed this from her list completely.    Current medicines are reviewed at length with the patient today.  The patient does not have concerns regarding medicines.  The following changes have been made:  ***  Labs/ tests ordered today include:   ***  No orders of the defined types were placed in this encounter.    Disposition:   FU with me in *** months per her request   Signed, Minus Breeding, MD  11/14/2021 7:25 PM    [facility]

## 2021-11-15 NOTE — Progress Notes (Signed)
Cardiology Office Note   Date:  11/16/2021   ID:  Rose Martinez, DOB 1934-12-26, MRN [id number]  PCP:  Dettinger, Fransisca Kaufmann, MD  Cardiologist:   None   Chief Complaint  Patient presents with   Palpitations       History of Present Illness: Rose Martinez is a 86 y.o. female who presents for followup of palpitations.  She was very depressed in 11/08/2023 because her dog Fuzzy died suddenly.  She had lots of palpitations around this with lots of anxiety.  However, this seems to have gotten better.  She has had occasional palpitations but she also uses some chocolate and drinks 1 cup of tea a day because she had decreased energy.  She has not had any presyncope or syncope.  She has lower blood pressures and her heart rate is low but it seems to go up with activities.  She is able.  She has not taken Zofran along all.   Past Medical History:  Diagnosis Date   Anxiety    Hyperlipidemia    Mild   Osteoporosis    Palpitations     Past Surgical History:  Procedure Laterality Date   CYSTECTOMY     Breast   EYE SURGERY     OOPHORECTOMY       Current Outpatient Medications  Medication Sig Dispense Refill   ALPRAZolam (XANAX) 0.25 MG tablet TAKE (1/2) TABLET UP TO THREE TIMES DAILY. 45 tablet 1   betamethasone, augmented, (DIPROLENE) 0.05 % lotion Apply 1 application topically daily.     cholecalciferol (VITAMIN D) 1000 UNITS tablet Take 2,000 Units by mouth daily.      CIPRODEX OTIC suspension Place 4 drops into the left ear in the morning, at noon, in the evening, and at bedtime.     fluticasone (FLONASE) 50 MCG/ACT nasal spray 1 SPRAY IN EACH NOSTRIL ONCE DAILY. 16 g 11   mirabegron ER (MYRBETRIQ) 25 MG TB24 tablet Take 1 tablet (25 mg total) by mouth daily. 90 tablet 3   montelukast (SINGULAIR) 10 MG tablet TAKE (1) TABLET DAILY AS DIRECTED. 30 tablet 0   Probiotic Product (ALIGN PO) Take 1 capsule by mouth daily.     propranolol (INDERAL) 20 MG tablet Take 1 tablet (20  mg total) by mouth daily as needed. 30 tablet 0   pseudoephedrine-guaifenesin (MUCINEX D) 60-600 MG 12 hr tablet Take 1 table 1-2 times daily 30 tablet 11   rosuvastatin (CRESTOR) 5 MG tablet Take 1 tablet (5 mg total) by mouth every other day. 90 tablet 0   betamethasone dipropionate 0.05 % lotion Apply topically as needed.     loratadine (CLARITIN) 10 MG tablet Take 10 mg by mouth daily.  (Patient not taking: Reported on 11/16/2021)     Current Facility-Administered Medications  Medication Dose Route Frequency Provider Last Rate Last Admin   cyanocobalamin ((VITAMIN B-12)) injection 1,000 mcg  1,000 mcg Intramuscular Q30 days Dettinger, Fransisca Kaufmann, MD   1,000 mcg at 11/01/21 1603    Allergies:   Statins, Cephalexin, and Penicillins   ROS:  Please see the history of present illness.   Otherwise, review of systems are positive for none.   All other systems are reviewed and negative.    PHYSICAL EXAM: VS:  BP 108/70   Pulse (!) 51   Ht 5\' 5"  (1.651 m)   Wt 130 lb (59 kg)   BMI 21.63 kg/m  , BMI Body mass index is 21.63 kg/m. GENERAL:  Well appearing NECK:  No jugular venous distention, waveform within normal limits, carotid upstroke brisk and symmetric, no bruits, no thyromegaly LUNGS:  Clear to auscultation bilaterally CHEST:  Unremarkable HEART:  PMI not displaced or sustained,S1 and S2 within normal limits, no S3, no S4, no clicks, no rubs, no murmurs ABD:  Flat, positive bowel sounds normal in frequency in pitch, no bruits, no rebound, no guarding, no midline pulsatile mass, no hepatomegaly, no splenomegaly EXT:  2 plus pulses throughout, no edema, no cyanosis no clubbing   EKG:  EKG is  ordered today. Sinus bradycardia with premature atrial contractions, axis within normal limits, intervals within normal limits, no acute ST-T wave changes.   Recent Labs: 07/28/2021: ALT 10; BUN 13; Creatinine, Ser 0.61; Hemoglobin 13.9; Platelets 166; Potassium 4.7; Sodium 143; TSH 1.460     Lipid Panel    Component Value Date/Time   CHOL 238 (H) 07/28/2021 0948   TRIG 93 07/28/2021 0948   TRIG 90 03/30/2017 0811   HDL 61 07/28/2021 0948   HDL 71 03/30/2017 0811   CHOLHDL 3.9 07/28/2021 0948   LDLCALC 161 (H) 07/28/2021 0948   LDLCALC 126 (H) 05/06/2014 0803      Wt Readings from Last 3 Encounters:  11/16/21 130 lb (59 kg)  07/28/21 130 lb (59 kg)  06/02/21 133 lb 6.4 oz (60.5 kg)      Other studies Reviewed: Additional studies/ records that were reviewed today include: Labs. Review of the above records demonstrates:  Please see elsewhere in the note.     ASSESSMENT AND PLAN:  PALPITATION:     The patient is having again rare palpitations.  She has bradycardia but no symptoms related to this.  We did talk about potential for presyncope or syncope if her heart rate gets lower.  She is not having that and we will follow this expectantly.  She did have normal thyroid earlier this year.  Electrolytes have been unremarkable.  DYSLIPIDEMIA:   She is not able to take a statin.      Current medicines are reviewed at length with the patient today.  The patient does not have concerns regarding medicines.  The following changes have been made:  None  Labs/ tests ordered today include:   None  Orders Placed This Encounter  Procedures   EKG 12-Lead      Disposition:   FU with me in 6 months per her request   Signed, Minus Breeding, MD  11/16/2021 12:36 PM    [facility]

## 2021-11-16 ENCOUNTER — Ambulatory Visit: Payer: Medicare Other | Admitting: Cardiology

## 2021-11-16 ENCOUNTER — Ambulatory Visit (INDEPENDENT_AMBULATORY_CARE_PROVIDER_SITE_OTHER): Payer: Medicare Other | Admitting: Cardiology

## 2021-11-16 ENCOUNTER — Encounter: Payer: Self-pay | Admitting: Cardiology

## 2021-11-16 ENCOUNTER — Other Ambulatory Visit: Payer: Self-pay

## 2021-11-16 VITALS — BP 108/70 | HR 51 | Ht 65.0 in | Wt 130.0 lb

## 2021-11-16 DIAGNOSIS — R002 Palpitations: Secondary | ICD-10-CM

## 2021-11-16 NOTE — Patient Instructions (Signed)
Medication Instructions:  The current medical regimen is effective;  continue present plan and medications.  *If you need a refill on your cardiac medications before your next appointment, please call your pharmacy*  Follow-Up: At [facility], you and your health needs are our priority.  As part of our continuing mission to provide you with exceptional heart care, we have created designated Provider Care Teams.  These Care Teams include your primary Cardiologist (physician) and Advanced Practice Providers (APPs -  Physician Assistants and Nurse Practitioners) who all work together to provide you with the care you need, when you need it.  We recommend signing up for the patient portal called "MyChart".  Sign up information is provided on this After Visit Summary.  MyChart is used to connect with patients for Virtual Visits (Telemedicine).  Patients are able to view lab/test results, encounter notes, upcoming appointments, etc.  Non-urgent messages can be sent to your provider as well.   To learn more about what you can do with MyChart, go to [email or web address].    Your next appointment:   6 month(s)  The format for your next appointment:   In Person  Provider:   James Hochrein, MD   Thank you for choosing [facility]!!     

## 2021-11-30 DIAGNOSIS — H04121 Dry eye syndrome of right lacrimal gland: Secondary | ICD-10-CM | POA: Diagnosis not present

## 2021-11-30 DIAGNOSIS — Z961 Presence of intraocular lens: Secondary | ICD-10-CM | POA: Diagnosis not present

## 2021-12-01 ENCOUNTER — Ambulatory Visit (INDEPENDENT_AMBULATORY_CARE_PROVIDER_SITE_OTHER): Payer: Medicare Other

## 2021-12-01 DIAGNOSIS — E538 Deficiency of other specified B group vitamins: Secondary | ICD-10-CM | POA: Diagnosis not present

## 2021-12-01 NOTE — Progress Notes (Signed)
Cyanocobalamin injection given to right deltoid.  Patient tolerated well. 

## 2021-12-05 ENCOUNTER — Telehealth: Payer: Self-pay | Admitting: Family Medicine

## 2021-12-05 NOTE — Telephone Encounter (Signed)
Made Dettinger aware. He agreed. Pt put on wait list by Lattie Haw

## 2021-12-07 ENCOUNTER — Ambulatory Visit (INDEPENDENT_AMBULATORY_CARE_PROVIDER_SITE_OTHER): Payer: Medicare Other | Admitting: Family Medicine

## 2021-12-07 ENCOUNTER — Encounter: Payer: Self-pay | Admitting: Family Medicine

## 2021-12-07 ENCOUNTER — Telehealth: Payer: Self-pay

## 2021-12-07 VITALS — BP 130/70 | HR 66 | Ht 65.0 in | Wt 127.0 lb

## 2021-12-07 DIAGNOSIS — E782 Mixed hyperlipidemia: Secondary | ICD-10-CM | POA: Diagnosis not present

## 2021-12-07 DIAGNOSIS — R002 Palpitations: Secondary | ICD-10-CM

## 2021-12-07 DIAGNOSIS — R001 Bradycardia, unspecified: Secondary | ICD-10-CM | POA: Diagnosis not present

## 2021-12-07 DIAGNOSIS — Z79899 Other long term (current) drug therapy: Secondary | ICD-10-CM

## 2021-12-07 DIAGNOSIS — F419 Anxiety disorder, unspecified: Secondary | ICD-10-CM | POA: Diagnosis not present

## 2021-12-07 MED ORDER — SERTRALINE HCL 25 MG PO TABS: 25.0000 mg | ORAL_TABLET | Freq: Every day | ORAL | 1 refills | Status: DC

## 2021-12-07 NOTE — Progress Notes (Signed)
BP 130/70    Pulse 66    Ht 5' 5"  (1.651 m)    Wt 127 lb (57.6 kg)    SpO2 99%    BMI 21.13 kg/m    Subjective:   Patient ID: Rose Martinez, female    DOB: 01-25-1935, 86 y.o.   MRN: [id number]  HPI: Rose Martinez is a 86 y.o. female presenting on 12/07/2021 for Heart Problem (States that heart rate is skipping. Denies palpitations/)   HPI Palpitations Patient is coming in today with questions about palpitations for years.  She thinks started all over the past couple months when her daughter passed away with the last reminder she had of her previous partner because it was his dog and that is really made her more anxious and not sleeping as well and has caused more heart flutters.  She did see her cardiologist and did not feel like they addressed too much except for he did say with a slow heart rate if she continued with symptoms she might need a pacemaker in the future.  She denies any lightheadedness or dizziness or chest pain or feeling faint.  She has been using her propranolol and Xanax a little more frequently than she usually does over the past couple weeks because of the symptoms.  Relevant past medical, surgical, family and social history reviewed and updated as indicated. Interim medical history since our last visit reviewed. Allergies and medications reviewed and updated.  Review of Systems  Constitutional:  Negative for chills and fever.  Eyes:  Negative for visual disturbance.  Respiratory:  Negative for chest tightness and shortness of breath.   Cardiovascular:  Positive for palpitations. Negative for chest pain and leg swelling.  Musculoskeletal:  Negative for back pain and gait problem.  Skin:  Negative for rash.  Neurological:  Negative for light-headedness and headaches.  Psychiatric/Behavioral:  Negative for agitation and behavioral problems.   All other systems reviewed and are negative.  Per HPI unless specifically indicated above   Allergies as of 12/07/2021        Reactions   Statins    myalgias   Cephalexin Other (See Comments)   Pt does not remember   Penicillins Other (See Comments)   Pt's mother was severly allergic, so she has never taken PCN.        Medication List        Accurate as of December 07, 2021  9:56 AM. If you have any questions, ask your nurse or doctor.          ALIGN PO Take 1 capsule by mouth daily.   ALPRAZolam 0.25 MG tablet Commonly known as: XANAX TAKE (1/2) TABLET UP TO THREE TIMES DAILY.   betamethasone (augmented) 0.05 % lotion Commonly known as: DIPROLENE Apply 1 application topically daily.   betamethasone dipropionate 0.05 % lotion Apply topically as needed.   cholecalciferol 1000 units tablet Commonly known as: VITAMIN D Take 2,000 Units by mouth daily.   Ciprodex OTIC suspension Generic drug: ciprofloxacin-dexamethasone Place 4 drops into the left ear in the morning, at noon, in the evening, and at bedtime.   fluticasone 50 MCG/ACT nasal spray Commonly known as: FLONASE 1 SPRAY IN EACH NOSTRIL ONCE DAILY.   loratadine 10 MG tablet Commonly known as: CLARITIN Take 10 mg by mouth daily.   mirabegron ER 25 MG Tb24 tablet Commonly known as: Myrbetriq Take 1 tablet (25 mg total) by mouth daily.   montelukast 10 MG tablet Commonly known as:  SINGULAIR TAKE (1) TABLET DAILY AS DIRECTED.   propranolol 20 MG tablet Commonly known as: INDERAL Take 1 tablet (20 mg total) by mouth daily as needed.   pseudoephedrine-guaifenesin 60-600 MG 12 hr tablet Commonly known as: MUCINEX D Take 1 table 1-2 times daily   rosuvastatin 5 MG tablet Commonly known as: Crestor Take 1 tablet (5 mg total) by mouth every other day.   sertraline 25 MG tablet Commonly known as: Zoloft Take 1 tablet (25 mg total) by mouth daily. Started by: Fransisca Kaufmann Nyles Mitton, MD         Objective:   BP 130/70    Pulse 66    Ht 5' 5"  (1.651 m)    Wt 127 lb (57.6 kg)    SpO2 99%    BMI 21.13 kg/m   Wt  Readings from Last 3 Encounters:  12/07/21 127 lb (57.6 kg)  11/16/21 130 lb (59 kg)  07/28/21 130 lb (59 kg)    Physical Exam Vitals and nursing note reviewed.  Constitutional:      General: She is not in acute distress.    Appearance: She is well-developed. She is not diaphoretic.  Eyes:     Conjunctiva/sclera: Conjunctivae normal.     Pupils: Pupils are equal, round, and reactive to light.  Cardiovascular:     Rate and Rhythm: Normal rate and regular rhythm.     Heart sounds: Normal heart sounds. No murmur heard. Pulmonary:     Effort: Pulmonary effort is normal. No respiratory distress.     Breath sounds: Normal breath sounds. No wheezing.  Musculoskeletal:        General: No tenderness. Normal range of motion.  Skin:    General: Skin is warm and dry.     Findings: No rash.  Neurological:     Mental Status: She is alert and oriented to person, place, and time.     Coordination: Coordination normal.  Psychiatric:        Behavior: Behavior normal.      Assessment & Plan:   Problem List Items Addressed This Visit       Other   Hyperlipidemia   Relevant Orders   CBC with Differential/Platelet   CMP14+EGFR   Lipid panel   Other Visit Diagnoses     Palpitations    -  Primary   Anxiety       Relevant Medications   sertraline (ZOLOFT) 25 MG tablet   Other Relevant Orders   ToxASSURE Select 13 (MW), Urine   Bradycardia       Controlled substance agreement signed       Relevant Orders   ToxASSURE Select 56 (MW), Urine       Start Zoloft, seems like her palpitations are a lot related to anxiety.  We will see if that helps with her. Follow up plan: Return if symptoms worsen or fail to improve.  Counseling provided for all of the vaccine components Orders Placed This Encounter  Procedures   CBC with Differential/Platelet   CMP14+EGFR   Lipid panel   ToxASSURE Select 13 (MW), Urine    Caryl Pina, MD [facility] 12/07/2021,  9:56 AM

## 2021-12-07 NOTE — Telephone Encounter (Signed)
Patient was worked in today for heart palpitations.   Pt requested that routine labs be placed and would like to leave a urine for her xanax prescription.  Pt was unable to leave a urine. Her contract ran out in Aug of 2021. She states that she has enough medication to make it to her now 6 month check up in June. Will address contract and toxassure at that time.  OV note from today also sent to [place] because pt states that she cannot get in touch with them. She would like for Hochrein to consider a heart monitor.

## 2021-12-08 ENCOUNTER — Telehealth: Payer: Self-pay | Admitting: *Deleted

## 2021-12-08 NOTE — Telephone Encounter (Signed)
-----   Message from Minus Breeding, MD sent at 12/08/2021  8:34 AM EST ----- Hilda Blades,  Can you arrange.  Thanks. ----- Message ----- From: Alphonzo Dublin, LPN Sent: [id number]   7:51 AM EST To: Minus Breeding, MD  Dr. Percival Spanish,  We do not have the Zio patch in our office yet. Could your office please order? ----- Message ----- From: Minus Breeding, MD Sent: 12/07/2021   5:58 PM EST To: Alphonzo Dublin, LPN  Please order a 2 week Zio patch  ----- Message ----- From: Alphonzo Dublin, LPN Sent: 80/32/1224  10:06 AM EST To: Minus Breeding, MD  Patient would like for Dr. Percival Spanish to consider a heart monitor. Please call patient to inform if needed. This is at patient's request.

## 2021-12-08 NOTE — Telephone Encounter (Signed)
Spoke with pt, she is aware that dr hochrein wants her to wear a 2 week monitor but she reports she does not want it mailed to her, she wants top come to the [place] office and have it applied. Aware do not know if that is possible but will have pam, [place] nurse, give her a call to discuss. She did not want me to order anything for her until she talks with pam.

## 2021-12-09 ENCOUNTER — Telehealth: Payer: Self-pay | Admitting: Cardiology

## 2021-12-09 NOTE — Telephone Encounter (Signed)
Minus Breeding, MD 5 minutes ago (5:23 PM)   I spoke to the patient.  She has no new symptoms and we have agreed that she will not have a monitor.       Note

## 2021-12-09 NOTE — Telephone Encounter (Signed)
I spoke to the patient.  She has no new symptoms and we have agreed that she will not have a monitor.

## 2021-12-22 DIAGNOSIS — Z79899 Other long term (current) drug therapy: Secondary | ICD-10-CM | POA: Diagnosis not present

## 2021-12-22 DIAGNOSIS — H608X3 Other otitis externa, bilateral: Secondary | ICD-10-CM | POA: Diagnosis not present

## 2021-12-22 DIAGNOSIS — H6123 Impacted cerumen, bilateral: Secondary | ICD-10-CM | POA: Diagnosis not present

## 2021-12-22 DIAGNOSIS — Z889 Allergy status to unspecified drugs, medicaments and biological substances status: Secondary | ICD-10-CM | POA: Diagnosis not present

## 2021-12-22 DIAGNOSIS — Z8709 Personal history of other diseases of the respiratory system: Secondary | ICD-10-CM | POA: Diagnosis not present

## 2022-01-02 DIAGNOSIS — L820 Inflamed seborrheic keratosis: Secondary | ICD-10-CM | POA: Diagnosis not present

## 2022-01-02 DIAGNOSIS — L218 Other seborrheic dermatitis: Secondary | ICD-10-CM | POA: Diagnosis not present

## 2022-01-02 DIAGNOSIS — L821 Other seborrheic keratosis: Secondary | ICD-10-CM | POA: Diagnosis not present

## 2022-01-05 ENCOUNTER — Ambulatory Visit (INDEPENDENT_AMBULATORY_CARE_PROVIDER_SITE_OTHER): Payer: Medicare Other | Admitting: *Deleted

## 2022-01-05 DIAGNOSIS — E538 Deficiency of other specified B group vitamins: Secondary | ICD-10-CM | POA: Diagnosis not present

## 2022-01-05 NOTE — Progress Notes (Signed)
Pt given B12 injection IM left deltoid and tolerated well. °

## 2022-01-09 ENCOUNTER — Ambulatory Visit: Payer: Medicare Other | Admitting: Family Medicine

## 2022-01-09 ENCOUNTER — Ambulatory Visit: Payer: Medicare Other

## 2022-01-27 ENCOUNTER — Ambulatory Visit: Payer: Medicare Other | Admitting: Family Medicine

## 2022-01-31 ENCOUNTER — Other Ambulatory Visit: Payer: Self-pay | Admitting: Family Medicine

## 2022-01-31 DIAGNOSIS — F419 Anxiety disorder, unspecified: Secondary | ICD-10-CM

## 2022-02-01 ENCOUNTER — Other Ambulatory Visit: Payer: Medicare Other

## 2022-02-01 DIAGNOSIS — E782 Mixed hyperlipidemia: Secondary | ICD-10-CM | POA: Diagnosis not present

## 2022-02-01 LAB — LIPID PANEL

## 2022-02-02 DIAGNOSIS — L218 Other seborrheic dermatitis: Secondary | ICD-10-CM | POA: Diagnosis not present

## 2022-02-02 DIAGNOSIS — L820 Inflamed seborrheic keratosis: Secondary | ICD-10-CM | POA: Diagnosis not present

## 2022-02-02 LAB — CMP14+EGFR
ALT: 13 IU/L (ref 0–32)
AST: 10 IU/L (ref 0–40)
Albumin/Globulin Ratio: 2.4 — ABNORMAL HIGH (ref 1.2–2.2)
Albumin: 4.4 g/dL (ref 3.6–4.6)
Alkaline Phosphatase: 81 IU/L (ref 44–121)
BUN/Creatinine Ratio: 25 (ref 12–28)
BUN: 17 mg/dL (ref 8–27)
Bilirubin Total: 0.3 mg/dL (ref 0.0–1.2)
CO2: 25 mmol/L (ref 20–29)
Calcium: 9 mg/dL (ref 8.7–10.3)
Chloride: 105 mmol/L (ref 96–106)
Creatinine, Ser: 0.69 mg/dL (ref 0.57–1.00)
Globulin, Total: 1.8 g/dL (ref 1.5–4.5)
Glucose: 93 mg/dL (ref 70–99)
Potassium: 5 mmol/L (ref 3.5–5.2)
Sodium: 142 mmol/L (ref 134–144)
Total Protein: 6.2 g/dL (ref 6.0–8.5)
eGFR: 86 mL/min/{1.73_m2} (ref >59)

## 2022-02-02 LAB — LIPID PANEL
Chol/HDL Ratio: 2.8 ratio (ref 0.0–4.4)
Cholesterol, Total: 159 mg/dL (ref 100–199)
HDL: 57 mg/dL (ref >39)
LDL Chol Calc (NIH): 89 mg/dL (ref 0–99)
Triglycerides: 68 mg/dL (ref 0–149)
VLDL Cholesterol Cal: 13 mg/dL (ref 5–40)

## 2022-02-02 LAB — CBC WITH DIFFERENTIAL/PLATELET
Basophils Absolute: 0 10*3/uL (ref 0.0–0.2)
Basos: 1 %
EOS (ABSOLUTE): 0.1 10*3/uL (ref 0.0–0.4)
Eos: 1 %
Hematocrit: 39.6 % (ref 34.0–46.6)
Hemoglobin: 13.3 g/dL (ref 11.1–15.9)
Immature Grans (Abs): 0 10*3/uL (ref 0.0–0.1)
Immature Granulocytes: 0 %
Lymphocytes Absolute: 1.4 10*3/uL (ref 0.7–3.1)
Lymphs: 31 %
MCH: 33 pg (ref 26.6–33.0)
MCHC: 33.6 g/dL (ref 31.5–35.7)
MCV: 98 fL — ABNORMAL HIGH (ref 79–97)
Monocytes Absolute: 0.4 10*3/uL (ref 0.1–0.9)
Monocytes: 9 %
Neutrophils Absolute: 2.6 10*3/uL (ref 1.4–7.0)
Neutrophils: 58 %
Platelets: 144 10*3/uL — ABNORMAL LOW (ref 150–450)
RBC: 4.03 x10E6/uL (ref 3.77–5.28)
RDW: 11.6 % — ABNORMAL LOW (ref 11.7–15.4)
WBC: 4.5 10*3/uL (ref 3.4–10.8)

## 2022-02-08 ENCOUNTER — Ambulatory Visit (INDEPENDENT_AMBULATORY_CARE_PROVIDER_SITE_OTHER): Payer: Medicare Other | Admitting: *Deleted

## 2022-02-08 DIAGNOSIS — E538 Deficiency of other specified B group vitamins: Secondary | ICD-10-CM | POA: Diagnosis not present

## 2022-02-08 NOTE — Progress Notes (Signed)
Pt given B12 injection IM right deltoid and tolerated well. 

## 2022-02-10 ENCOUNTER — Encounter: Payer: Self-pay | Admitting: Family Medicine

## 2022-02-10 ENCOUNTER — Ambulatory Visit (INDEPENDENT_AMBULATORY_CARE_PROVIDER_SITE_OTHER): Payer: Medicare Other | Admitting: Family Medicine

## 2022-02-10 VITALS — BP 112/55 | HR 67 | Wt 128.0 lb

## 2022-02-10 DIAGNOSIS — F4323 Adjustment disorder with mixed anxiety and depressed mood: Secondary | ICD-10-CM | POA: Diagnosis not present

## 2022-02-10 DIAGNOSIS — E782 Mixed hyperlipidemia: Secondary | ICD-10-CM | POA: Diagnosis not present

## 2022-02-10 DIAGNOSIS — Z79891 Long term (current) use of opiate analgesic: Secondary | ICD-10-CM | POA: Diagnosis not present

## 2022-02-10 MED ORDER — ROSUVASTATIN CALCIUM 5 MG PO TABS: 5.0000 mg | ORAL_TABLET | Freq: Every day | ORAL | 3 refills | Status: DC

## 2022-02-10 NOTE — Progress Notes (Signed)
BP (!) 112/55    Pulse 67    Wt 128 lb (58.1 kg)    SpO2 97%    BMI 21.30 kg/m    Subjective:   Patient ID: Rose Martinez, female    DOB: [birth year], 87 y.o.   MRN: [id number]  HPI: Rose Martinez is a 87 y.o. female presenting on 02/10/2022 for Medical Management of Chronic Issues, Hyperlipidemia, and Anxiety   HPI Hyperlipidemia Patient is coming in for recheck of his hyperlipidemia. The patient is currently taking crestor. They deny any issues with myalgias or history of liver damage from it. They deny any focal numbness or weakness or chest pain.   Anxiety Patient is coming in for anxiety recheck. She needs UDS today  Patient sees dermatologist for some scalp issues and that he gave her hydrocortisone ointment to use on her scalp and she says it is improving  Relevant past medical, surgical, family and social history reviewed and updated as indicated. Interim medical history since our last visit reviewed. Allergies and medications reviewed and updated.  Review of Systems  Constitutional:  Negative for chills and fever.  Eyes:  Negative for visual disturbance.  Respiratory:  Negative for chest tightness and shortness of breath.   Cardiovascular:  Positive for palpitations (Gets occasional palpitations, has been diagnosed with PACs). Negative for chest pain and leg swelling.  Genitourinary:  Negative for difficulty urinating and dysuria.  Musculoskeletal:  Negative for back pain and gait problem.  Skin:  Negative for rash.  Neurological:  Negative for light-headedness and headaches.  Psychiatric/Behavioral:  Negative for agitation and behavioral problems.   All other systems reviewed and are negative.  Per HPI unless specifically indicated above   Allergies as of 02/10/2022       Reactions   Statins    myalgias   Cephalexin Other (See Comments)   Pt does not remember   Penicillins Other (See Comments)   Pt's mother was severly allergic, so she has never taken PCN.         Medication List        Accurate as of February 10, 2022 10:30 AM. If you have any questions, ask your nurse or doctor.          ALIGN PO Take 1 capsule by mouth daily.   ALPRAZolam 0.25 MG tablet Commonly known as: XANAX TAKE (1/2) TABLET UP TO THREE TIMES DAILY.   betamethasone (augmented) 0.05 % lotion Commonly known as: DIPROLENE Apply 1 application topically daily.   betamethasone dipropionate 0.05 % lotion Apply topically as needed.   cholecalciferol 1000 units tablet Commonly known as: VITAMIN D Take 2,000 Units by mouth daily.   Ciprodex OTIC suspension Generic drug: ciprofloxacin-dexamethasone Place 4 drops into the left ear in the morning, at noon, in the evening, and at bedtime.   fluticasone 50 MCG/ACT nasal spray Commonly known as: FLONASE 1 SPRAY IN EACH NOSTRIL ONCE DAILY.   Hydrocortisone Butyrate 0.1 % Lotn Apply 1 application topically as needed.   loratadine 10 MG tablet Commonly known as: CLARITIN Take 10 mg by mouth daily.   mirabegron ER 25 MG Tb24 tablet Commonly known as: Myrbetriq Take 1 tablet (25 mg total) by mouth daily.   montelukast 10 MG tablet Commonly known as: SINGULAIR TAKE (1) TABLET DAILY AS DIRECTED.   mupirocin ointment 2 % Commonly known as: BACTROBAN Apply 1 application topically 2 (two) times daily. to affected area(s)   propranolol 20 MG tablet Commonly known as: INDERAL Take  1 tablet (20 mg total) by mouth daily as needed.   pseudoephedrine-guaifenesin 60-600 MG 12 hr tablet Commonly known as: MUCINEX D Take 1 table 1-2 times daily   rosuvastatin 5 MG tablet Commonly known as: CRESTOR Take 1 tablet (5 mg total) by mouth at bedtime. What changed: See the new instructions. Changed by: Fransisca Kaufmann Adriana Lina, MD   sertraline 25 MG tablet Commonly known as: ZOLOFT TAKE ONE TABLET ONCE DAILY         Objective:   BP (!) 112/55    Pulse 67    Wt 128 lb (58.1 kg)    SpO2 97%    BMI 21.30 kg/m    Wt Readings from Last 3 Encounters:  02/10/22 128 lb (58.1 kg)  12/07/21 127 lb (57.6 kg)  11/16/21 130 lb (59 kg)    Physical Exam Vitals and nursing note reviewed.  Constitutional:      General: She is not in acute distress.    Appearance: She is well-developed. She is not diaphoretic.  Eyes:     Conjunctiva/sclera: Conjunctivae normal.     Pupils: Pupils are equal, round, and reactive to light.  Cardiovascular:     Rate and Rhythm: Normal rate and regular rhythm.     Heart sounds: Normal heart sounds. No murmur heard. Pulmonary:     Effort: Pulmonary effort is normal. No respiratory distress.     Breath sounds: Normal breath sounds. No wheezing.  Musculoskeletal:        General: No tenderness. Normal range of motion.  Skin:    General: Skin is warm and dry.     Findings: No rash.  Neurological:     Mental Status: She is alert and oriented to person, place, and time.     Coordination: Coordination normal.  Psychiatric:        Behavior: Behavior normal.    Results for orders placed or performed in visit on 12/07/21  CBC with Differential/Platelet  Result Value Ref Range   WBC 4.5 3.4 - 10.8 x10E3/uL   RBC 4.03 3.77 - 5.28 x10E6/uL   Hemoglobin 13.3 11.1 - 15.9 g/dL   Hematocrit 39.6 34.0 - 46.6 %   MCV 98 (H) 79 - 97 fL   MCH 33.0 26.6 - 33.0 pg   MCHC 33.6 31.5 - 35.7 g/dL   RDW 11.6 (L) 11.7 - 15.4 %   Platelets 144 (L) 150 - 450 x10E3/uL   Neutrophils 58 Not Estab. %   Lymphs 31 Not Estab. %   Monocytes 9 Not Estab. %   Eos 1 Not Estab. %   Basos 1 Not Estab. %   Neutrophils Absolute 2.6 1.4 - 7.0 x10E3/uL   Lymphocytes Absolute 1.4 0.7 - 3.1 x10E3/uL   Monocytes Absolute 0.4 0.1 - 0.9 x10E3/uL   EOS (ABSOLUTE) 0.1 0.0 - 0.4 x10E3/uL   Basophils Absolute 0.0 0.0 - 0.2 x10E3/uL   Immature Granulocytes 0 Not Estab. %   Immature Grans (Abs) 0.0 0.0 - 0.1 x10E3/uL  CMP14+EGFR  Result Value Ref Range   Glucose 93 70 - 99 mg/dL   BUN 17 8 - 27 mg/dL    Creatinine, Ser 0.69 0.57 - 1.00 mg/dL   eGFR 86 >59 mL/min/1.73   BUN/Creatinine Ratio 25 12 - 28   Sodium 142 134 - 144 mmol/L   Potassium 5.0 3.5 - 5.2 mmol/L   Chloride 105 96 - 106 mmol/L   CO2 25 20 - 29 mmol/L   Calcium 9.0 8.7 - 10.3  mg/dL   Total Protein 6.2 6.0 - 8.5 g/dL   Albumin 4.4 3.6 - 4.6 g/dL   Globulin, Total 1.8 1.5 - 4.5 g/dL   Albumin/Globulin Ratio 2.4 (H) 1.2 - 2.2   Bilirubin Total 0.3 0.0 - 1.2 mg/dL   Alkaline Phosphatase 81 44 - 121 IU/L   AST 10 0 - 40 IU/L   ALT 13 0 - 32 IU/L  Lipid panel  Result Value Ref Range   Cholesterol, Total 159 100 - 199 mg/dL   Triglycerides 68 0 - 149 mg/dL   HDL 57 >39 mg/dL   VLDL Cholesterol Cal 13 5 - 40 mg/dL   LDL Chol Calc (NIH) 89 0 - 99 mg/dL   Chol/HDL Ratio 2.8 0.0 - 4.4 ratio    Assessment & Plan:   Problem List Items Addressed This Visit       Other   Adjustment disorder with mixed anxiety and depressed mood   Relevant Orders   ToxASSURE Select 13 (MW), Urine   Hyperlipidemia - Primary   Relevant Medications   rosuvastatin (CRESTOR) 5 MG tablet    Continue current medicine.  Seems to be doing well.  Discussed doing vagal maneuvers and bearing down to help with her palpitations at times.  Do urine tox assure today Follow up plan: Return in about 6 months (around 08/10/2022), or if symptoms worsen or fail to improve, for Hyperlipidemia recheck .  Counseling provided for all of the vaccine components Orders Placed This Encounter  Procedures   ToxASSURE Select 13 (MW), Urine    Caryl Pina, MD [facility] 02/10/2022, 10:30 AM

## 2022-02-15 LAB — TOXASSURE SELECT 13 (MW), URINE

## 2022-03-08 ENCOUNTER — Ambulatory Visit (INDEPENDENT_AMBULATORY_CARE_PROVIDER_SITE_OTHER): Payer: Medicare Other

## 2022-03-08 DIAGNOSIS — E538 Deficiency of other specified B group vitamins: Secondary | ICD-10-CM | POA: Diagnosis not present

## 2022-03-08 NOTE — Progress Notes (Signed)
Cyanocobalamin injection given to left deltoid.  Patient tolerated well. 

## 2022-03-09 ENCOUNTER — Ambulatory Visit (INDEPENDENT_AMBULATORY_CARE_PROVIDER_SITE_OTHER): Payer: Medicare Other | Admitting: Family Medicine

## 2022-03-09 ENCOUNTER — Encounter: Payer: Self-pay | Admitting: Family Medicine

## 2022-03-09 VITALS — BP 125/67 | HR 61 | Ht 65.0 in | Wt 128.0 lb

## 2022-03-09 DIAGNOSIS — S39012A Strain of muscle, fascia and tendon of lower back, initial encounter: Secondary | ICD-10-CM | POA: Diagnosis not present

## 2022-03-09 MED ORDER — BACLOFEN 10 MG PO TABS: 10.0000 mg | ORAL_TABLET | Freq: Every evening | ORAL | 0 refills | Status: DC | PRN

## 2022-03-09 MED ORDER — PREDNISONE 20 MG PO TABS: ORAL_TABLET | ORAL | 0 refills | Status: DC

## 2022-03-09 NOTE — Progress Notes (Signed)
? ?BP 125/67   Pulse 61   Ht '5\' 5"'$  (1.651 m)   Wt 128 lb (58.1 kg)   SpO2 99%   BMI 21.30 kg/m?   ? ?Subjective:  ? ?Patient ID: Rose Martinez, female    DOB: 15-Aug-1935, 87 y.o.   MRN: [id number] ? ?HPI: ?Rose Martinez is a 87 y.o. female presenting on 03/09/2022 for Back Pain ? ? ?HPI ?Back pain ?Patient is coming in today with back pain in her lower back on both sides and a bandlike area across her lower back.  She says this has been going on over the past few days.  She has been taking Tylenol for the past few days.  She does think that a lot of this could be related to her puppy.  He is very active and she is trying to train him and he does pull on her a lot and sometimes that has caused her lower back to her.  She says it hurts also when she tries to bend over and pick something up.  She has been taking it more easy trying to get up more slowly and that has helped.  She says the Tylenol does help but she has been taking a lot of it and does not seem to be getting rid of the problem just covering.  She denies any fevers or chills or numbness or weakness. ? ?Relevant past medical, surgical, family and social history reviewed and updated as indicated. Interim medical history since our last visit reviewed. ?Allergies and medications reviewed and updated. ? ?Review of Systems  ?Constitutional:  Negative for chills and fever.  ?Eyes:  Negative for visual disturbance.  ?Musculoskeletal:  Positive for back pain and myalgias. Negative for arthralgias and gait problem.  ?Skin:  Negative for color change and rash.  ?Neurological:  Negative for light-headedness and headaches.  ?Psychiatric/Behavioral:  Negative for agitation and behavioral problems.   ?All other systems reviewed and are negative. ? ?Per HPI unless specifically indicated above ? ? ?Allergies as of 03/09/2022   ? ?   Reactions  ? Statins   ? myalgias  ? Cephalexin Other (See Comments)  ? Pt does not remember  ? Penicillins Other (See Comments)  ? Pt's  mother was severly allergic, so she has never taken PCN.  ? ?  ? ?  ?Medication List  ?  ? ?  ? Accurate as of March 09, 2022  9:25 AM. If you have any questions, ask your nurse or doctor.  ?  ?  ? ?  ? ?ALIGN PO ?Take 1 capsule by mouth daily. ?  ?ALPRAZolam 0.25 MG tablet ?Commonly known as: Duanne Moron ?TAKE (1/2) TABLET UP TO THREE TIMES DAILY. ?  ?baclofen 10 MG tablet ?Commonly known as: LIORESAL ?Take 1 tablet (10 mg total) by mouth at bedtime as needed for muscle spasms. ?Started by: Worthy Rancher, MD ?  ?betamethasone (augmented) 0.05 % lotion ?Commonly known as: DIPROLENE ?Apply 1 application topically daily. ?  ?betamethasone dipropionate 0.05 % lotion ?Apply topically as needed. ?  ?cholecalciferol 1000 units tablet ?Commonly known as: VITAMIN D ?Take 2,000 Units by mouth daily. ?  ?Ciprodex OTIC suspension ?Generic drug: ciprofloxacin-dexamethasone ?Place 4 drops into the left ear in the morning, at noon, in the evening, and at bedtime. ?  ?fluticasone 50 MCG/ACT nasal spray ?Commonly known as: FLONASE ?1 SPRAY IN EACH NOSTRIL ONCE DAILY. ?  ?Hydrocortisone Butyrate 0.1 % Lotn ?Apply 1 application topically as needed. ?  ?  loratadine 10 MG tablet ?Commonly known as: CLARITIN ?Take 10 mg by mouth daily. ?  ?mirabegron ER 25 MG Tb24 tablet ?Commonly known as: Myrbetriq ?Take 1 tablet (25 mg total) by mouth daily. ?  ?montelukast 10 MG tablet ?Commonly known as: SINGULAIR ?TAKE (1) TABLET DAILY AS DIRECTED. ?  ?mupirocin ointment 2 % ?Commonly known as: BACTROBAN ?Apply 1 application topically 2 (two) times daily. to affected area(s) ?  ?predniSONE 20 MG tablet ?Commonly known as: DELTASONE ?2 po at same time daily for 5 days ?Started by: Worthy Rancher, MD ?  ?propranolol 20 MG tablet ?Commonly known as: INDERAL ?Take 1 tablet (20 mg total) by mouth daily as needed. ?  ?pseudoephedrine-guaifenesin 60-600 MG 12 hr tablet ?Commonly known as: MUCINEX D ?Take 1 table 1-2 times daily ?  ?rosuvastatin 5 MG  tablet ?Commonly known as: CRESTOR ?Take 1 tablet (5 mg total) by mouth at bedtime. ?  ?sertraline 25 MG tablet ?Commonly known as: ZOLOFT ?TAKE ONE TABLET ONCE DAILY ?  ? ?  ? ? ? ?Objective:  ? ?BP 125/67   Pulse 61   Ht '5\' 5"'$  (1.651 m)   Wt 128 lb (58.1 kg)   SpO2 99%   BMI 21.30 kg/m?   ?Wt Readings from Last 3 Encounters:  ?03/09/22 128 lb (58.1 kg)  ?02/10/22 128 lb (58.1 kg)  ?12/07/21 127 lb (57.6 kg)  ?  ?Physical Exam ?Vitals and nursing note reviewed.  ?Constitutional:   ?   Appearance: Normal appearance.  ?Musculoskeletal:  ?   Lumbar back: Tenderness (Pain in a bandlike area across her lower back around her belt line) present. No deformity, spasms or bony tenderness. Normal range of motion. Negative right straight leg raise test and negative left straight leg raise test. No scoliosis.  ?Neurological:  ?   Mental Status: She is alert.  ? ? ? ? ?Assessment & Plan:  ? ?Problem List Items Addressed This Visit   ?None ?Visit Diagnoses   ? ? Lumbar strain, initial encounter    -  Primary  ? Relevant Medications  ? predniSONE (DELTASONE) 20 MG tablet  ? baclofen (LIORESAL) 10 MG tablet  ? Other Relevant Orders  ? DG Lumbar Spine 2-3 Views  ? ?  ?  ?We will do a short burst of prednisone and she can use baclofen as needed.  Gave list of stretches for her back ?Follow up plan: ?Return if symptoms worsen or fail to improve. ? ?Counseling provided for all of the vaccine components ?Orders Placed This Encounter  ?Procedures  ? DG Lumbar Spine 2-3 Views  ? ? ?Caryl Pina, MD ?[place] ?03/09/2022, 9:25 AM ? ? ? ? ?

## 2022-03-13 ENCOUNTER — Telehealth: Payer: Self-pay | Admitting: Family Medicine

## 2022-03-13 DIAGNOSIS — S39012A Strain of muscle, fascia and tendon of lower back, initial encounter: Secondary | ICD-10-CM

## 2022-03-13 NOTE — Telephone Encounter (Signed)
Prednisone has a lot of long-term side effects so that is why we do not do it too often or too much, she wants another short course of it we can refill the prednisone for 5 days similar to what she has but I would not necessarily increase it or extend it much past that because of the side effects that would have.  Make sure she is taking it with food so it does not cause stomach issues ?

## 2022-03-13 NOTE — Telephone Encounter (Signed)
Tried calling pts cell. No answer no machine. ?

## 2022-03-14 ENCOUNTER — Other Ambulatory Visit: Payer: Self-pay | Admitting: Family Medicine

## 2022-03-14 DIAGNOSIS — S39012A Strain of muscle, fascia and tendon of lower back, initial encounter: Secondary | ICD-10-CM

## 2022-03-14 MED ORDER — PREDNISONE 20 MG PO TABS: ORAL_TABLET | ORAL | 0 refills | Status: DC

## 2022-03-14 NOTE — Telephone Encounter (Signed)
Already sent refill for prednisone ?

## 2022-03-14 NOTE — Telephone Encounter (Signed)
Pt has been made aware of Dr. Merita Norton recommendations. Short course of Prednisone '20mg'$  daily has been sent to [facility]. ?

## 2022-03-16 ENCOUNTER — Telehealth: Payer: Self-pay | Admitting: Family Medicine

## 2022-03-16 DIAGNOSIS — S39012D Strain of muscle, fascia and tendon of lower back, subsequent encounter: Secondary | ICD-10-CM

## 2022-03-16 NOTE — Telephone Encounter (Signed)
Patient would like referral to PT now next door would like to get [place] [facility]. ?

## 2022-03-17 NOTE — Telephone Encounter (Signed)
Placed referral for physical therapy ?

## 2022-03-20 ENCOUNTER — Telehealth: Payer: Self-pay | Admitting: Family Medicine

## 2022-03-20 ENCOUNTER — Ambulatory Visit (INDEPENDENT_AMBULATORY_CARE_PROVIDER_SITE_OTHER): Payer: Medicare Other | Admitting: Family Medicine

## 2022-03-20 ENCOUNTER — Encounter: Payer: Self-pay | Admitting: Family Medicine

## 2022-03-20 VITALS — BP 108/64 | HR 75 | Temp 97.8°F | Ht 65.0 in | Wt 126.6 lb

## 2022-03-20 DIAGNOSIS — G5701 Lesion of sciatic nerve, right lower limb: Secondary | ICD-10-CM

## 2022-03-20 DIAGNOSIS — R4589 Other symptoms and signs involving emotional state: Secondary | ICD-10-CM

## 2022-03-20 DIAGNOSIS — M533 Sacrococcygeal disorders, not elsewhere classified: Secondary | ICD-10-CM | POA: Diagnosis not present

## 2022-03-20 MED ORDER — DICLOFENAC SODIUM 75 MG PO TBEC: 75.0000 mg | DELAYED_RELEASE_TABLET | Freq: Two times a day (BID) | ORAL | 1 refills | Status: DC

## 2022-03-20 MED ORDER — DULOXETINE HCL 30 MG PO CPEP: 30.0000 mg | ORAL_CAPSULE | Freq: Every day | ORAL | 1 refills | Status: DC

## 2022-03-20 NOTE — Telephone Encounter (Signed)
Patient aware and verbalizes understanding. 

## 2022-03-20 NOTE — Telephone Encounter (Signed)
Appointment scheduled.

## 2022-03-20 NOTE — Telephone Encounter (Signed)
Yes backs and sciatic issues usually do better if you keep moving so walking is good, let pain be your guide but keeping moving is important ?

## 2022-03-20 NOTE — Progress Notes (Signed)
? ?BP 108/64   Pulse 75   Temp 97.8 ?F (36.6 ?C) (Temporal)   Ht '5\' 5"'$  (1.651 m)   Wt 126 lb 9.6 oz (57.4 kg)   BMI 21.07 kg/m?   ? ?Subjective:  ? ?Patient ID: Rose Martinez, female    DOB: 04/20/1935, 87 y.o.   MRN: [id number] ? ?HPI: ?Rose Martinez is a 87 y.o. female presenting on 03/20/2022 for Back Pain (Lower back pain.  Patient was seen on 3/23 and states it is no better. She is having to take 2 tylenol every 8 hours. ), Depression (Patient states that since her back pain she can not keep her dog. He has been gone for 3 days and she has been depressed since then. ), and Chills (Patient states when she sits and has a meal she gets chills in her legs x 3 weeks. ) ? ? ?HPI ?Low back pain follow-up ?Patient is coming in today for recheck on low back pain.  Says the back pain was in a bandlike area before and not part resolved but now it is more on the right lower side which is a little lower down and it still hurts when she bends forward or twists.  She does very sad because she had to give up was not able to help take care of him because of the lower back pain.  She says she is taking 2 Tylenol every 8 hours and that does help some but it is not helping completely.  She says she is feeling more down and depressed because does not have her puppy and the pain is making her so she is not as mobile and able to move as she was before and that is very bothersome for her. ? ?Relevant past medical, surgical, family and social history reviewed and updated as indicated. Interim medical history since our last visit reviewed. ?Allergies and medications reviewed and updated. ? ?Review of Systems  ?Constitutional:  Negative for chills and fever.  ?Eyes:  Negative for redness and visual disturbance.  ?Respiratory:  Negative for chest tightness and shortness of breath.   ?Cardiovascular:  Positive for leg swelling (She gets occasional leg). Negative for chest pain.  ?Musculoskeletal:  Positive for arthralgias and back  pain. Negative for gait problem.  ?Neurological:  Negative for dizziness, weakness, light-headedness, numbness and headaches.  ?Psychiatric/Behavioral:  Positive for dysphoric mood. Negative for agitation, behavioral problems, self-injury, sleep disturbance and suicidal ideas. The patient is nervous/anxious.   ?All other systems reviewed and are negative. ? ?Per HPI unless specifically indicated above ? ? ?Allergies as of 03/20/2022   ? ?   Reactions  ? Statins   ? myalgias  ? Cephalexin Other (See Comments)  ? Pt does not remember  ? Penicillins Other (See Comments)  ? Pt's mother was severly allergic, so she has never taken PCN.  ? ?  ? ?  ?Medication List  ?  ? ?  ? Accurate as of March 20, 2022  3:09 PM. If you have any questions, ask your nurse or doctor.  ?  ?  ? ?  ? ?STOP taking these medications   ? ?predniSONE 20 MG tablet ?Commonly known as: DELTASONE ?Stopped by: Worthy Rancher, MD ?  ? ?  ? ?TAKE these medications   ? ?ALIGN PO ?Take 1 capsule by mouth daily. ?  ?ALPRAZolam 0.25 MG tablet ?Commonly known as: Duanne Moron ?TAKE (1/2) TABLET UP TO THREE TIMES DAILY. ?  ?baclofen 10 MG tablet ?  Commonly known as: LIORESAL ?Take 1 tablet (10 mg total) by mouth at bedtime as needed for muscle spasms. ?  ?betamethasone (augmented) 0.05 % lotion ?Commonly known as: DIPROLENE ?Apply 1 application topically daily. ?  ?betamethasone dipropionate 0.05 % lotion ?Apply topically as needed. ?  ?cholecalciferol 1000 units tablet ?Commonly known as: VITAMIN D ?Take 2,000 Units by mouth daily. ?  ?Ciprodex OTIC suspension ?Generic drug: ciprofloxacin-dexamethasone ?Place 4 drops into the left ear in the morning, at noon, in the evening, and at bedtime. ?  ?diclofenac 75 MG EC tablet ?Commonly known as: VOLTAREN ?Take 1 tablet (75 mg total) by mouth 2 (two) times daily. For arthritis and pain ?Started by: Worthy Rancher, MD ?  ?DULoxetine 30 MG capsule ?Commonly known as: Cymbalta ?Take 1 capsule (30 mg total) by mouth at  bedtime. For depression ?Started by: Worthy Rancher, MD ?  ?fluticasone 50 MCG/ACT nasal spray ?Commonly known as: FLONASE ?1 SPRAY IN EACH NOSTRIL ONCE DAILY. ?  ?Hydrocortisone Butyrate 0.1 % Lotn ?Apply 1 application topically as needed. ?  ?loratadine 10 MG tablet ?Commonly known as: CLARITIN ?Take 10 mg by mouth daily. ?  ?mirabegron ER 25 MG Tb24 tablet ?Commonly known as: Myrbetriq ?Take 1 tablet (25 mg total) by mouth daily. ?  ?montelukast 10 MG tablet ?Commonly known as: SINGULAIR ?TAKE (1) TABLET DAILY AS DIRECTED. ?  ?mupirocin ointment 2 % ?Commonly known as: BACTROBAN ?Apply 1 application topically 2 (two) times daily. to affected area(s) ?  ?propranolol 20 MG tablet ?Commonly known as: INDERAL ?Take 1 tablet (20 mg total) by mouth daily as needed. ?  ?pseudoephedrine-guaifenesin 60-600 MG 12 hr tablet ?Commonly known as: MUCINEX D ?Take 1 table 1-2 times daily ?  ?rosuvastatin 5 MG tablet ?Commonly known as: CRESTOR ?Take 1 tablet (5 mg total) by mouth at bedtime. ?  ?sertraline 25 MG tablet ?Commonly known as: ZOLOFT ?TAKE ONE TABLET ONCE DAILY ?  ? ?  ? ? ? ?Objective:  ? ?BP 108/64   Pulse 75   Temp 97.8 ?F (36.6 ?C) (Temporal)   Ht '5\' 5"'$  (1.651 m)   Wt 126 lb 9.6 oz (57.4 kg)   BMI 21.07 kg/m?   ?Wt Readings from Last 3 Encounters:  ?03/20/22 126 lb 9.6 oz (57.4 kg)  ?03/09/22 128 lb (58.1 kg)  ?02/10/22 128 lb (58.1 kg)  ?  ?Physical Exam ?Vitals and nursing note reviewed.  ?Constitutional:   ?   General: She is not in acute distress. ?   Appearance: She is well-developed. She is not diaphoretic.  ?Eyes:  ?   Conjunctiva/sclera: Conjunctivae normal.  ?Musculoskeletal:     ?   General: No swelling (No swelling on exam today) or tenderness. Normal range of motion.  ?     Legs: ? ?Skin: ?   General: Skin is warm and dry.  ?   Findings: No rash.  ?Neurological:  ?   Mental Status: She is alert and oriented to person, place, and time.  ?   Coordination: Coordination normal.  ?Psychiatric:      ?   Behavior: Behavior normal.  ? ? ? ? ?Assessment & Plan:  ? ?Problem List Items Addressed This Visit   ?None ?Visit Diagnoses   ? ? SI (sacroiliac) joint dysfunction    -  Primary  ? Relevant Medications  ? diclofenac (VOLTAREN) 75 MG EC tablet  ? DULoxetine (CYMBALTA) 30 MG capsule  ? Sciatic nerve disease, right      ?  Relevant Medications  ? diclofenac (VOLTAREN) 75 MG EC tablet  ? DULoxetine (CYMBALTA) 30 MG capsule  ? Depressed mood      ? Relevant Medications  ? DULoxetine (CYMBALTA) 30 MG capsule  ? ?  ?  ?Will start Cymbalta, if she does well on it we increase it we may back off on the Zoloft in the future but hopes that it will help with the pain along with the mood. ? ?We will get Voltaren oral and instructed to take with food to help with the arthritic condition and she is going to physical therapy on Wednesday ?Follow up plan: ?Return if symptoms worsen or fail to improve, for 1 to 2 months follow-up on SI joint and anxiety depression. ? ?Counseling provided for all of the vaccine components ?No orders of the defined types were placed in this encounter. ? ? ?Caryl Pina, MD ?[place] ?03/20/2022, 3:09 PM ? ? ? ? ?

## 2022-03-20 NOTE — Telephone Encounter (Signed)
Pt forgot to ask Dr Dettinger if its a good idea to be walking/exercising as much as she does with her hip issue?  ?

## 2022-03-22 ENCOUNTER — Ambulatory Visit: Payer: Medicare Other | Attending: Family Medicine | Admitting: Physical Therapy

## 2022-03-22 DIAGNOSIS — M5459 Other low back pain: Secondary | ICD-10-CM | POA: Diagnosis not present

## 2022-03-22 DIAGNOSIS — S39012D Strain of muscle, fascia and tendon of lower back, subsequent encounter: Secondary | ICD-10-CM | POA: Insufficient documentation

## 2022-03-22 NOTE — Therapy (Signed)
[place] ?[facility] ?[place] ?[place], [place], [address] ?Phone: [phone]   Fax:  [phone] ? ?Physical Therapy Evaluation ? ?Patient Details  ?Name: Rose Martinez ?MRN: [id number] ?Date of Birth: 01-23-1935 ?Referring Provider (PT): Caryl Pina MD ? ? ?Encounter Date: 03/22/2022 ? ? PT End of Session - 03/22/22 1211   ? ? Visit Number 1   ? Number of Visits 12   ? Date for PT Re-Evaluation 06/21/22   ? PT Start Time [phone]   ? PT Stop Time 1020   ? PT Time Calculation (min) 57 min   ? ?  ?  ? ?  ? ? ?Past Medical History:  ?Diagnosis Date  ? Anxiety   ? Hyperlipidemia   ? Mild  ? Osteoporosis   ? Palpitations   ? ? ?Past Surgical History:  ?Procedure Laterality Date  ? CYSTECTOMY    ? Breast  ? EYE SURGERY    ? OOPHORECTOMY    ? ? ?There were no vitals filed for this visit. ? ? ? Subjective Assessment - 03/22/22 1219   ? ? Subjective The patient presents to the clinic today with c/o low back that has been ongoing for several weeks.  She states she feels this has been related to training a puppy and doing a lot of bending as well as the puppy pulling at the leash.  Initially, the pain was on both sides of her low back like a band across her low back.  This seems to have improved some and taking Tylenol has been helpful.  Today, he CC ismore on the right side.  Her pain rating is about an 8/10.  She is a very active person and wants to decrease her pain so she can care for her new dog.   ? Pertinent History OP, "slow heart."   ? How long can you walk comfortably? Patient stays very active though her pain increases with increased activity.   ? Patient Stated Goals Get back to normal life and do all the things she did before the onset of pain (ie:  Managing her family estate and caring for her dog [name].   ? Currently in Pain? Yes   ? Pain Score 8    ? Pain Location Back   ? Pain Orientation Right   ? Pain Descriptors / Indicators Aching   ? Pain Type Acute pain   ? Pain  Radiating Towards Right buttock.   ? Pain Onset 1 to 4 weeks ago   ? Pain Frequency Constant   ? Aggravating Factors  "Not taking Tylenol."   ? Pain Relieving Factors Tylenol.   ? ?  ?  ? ?  ? ? ? ? ? [facility] PT Assessment - 03/22/22 0001   ? ?  ? Assessment  ? Medical Diagnosis Lumbar strain   ? Referring Provider (PT) Caryl Pina MD   ? Onset Date/Surgical Date --   "Weeks."  ?  ? Precautions  ? Precautions --   OP.  ?  ? Restrictions  ? Weight Bearing Restrictions No   ?  ? Balance Screen  ? Has the patient fallen in the past 6 months No   ? Has the patient had a decrease in activity level because of a fear of falling?  No   ? Is the patient reluctant to leave their home because of a fear of falling?  No   ?  ? Home Environment  ? Living Environment Private residence   ?  ?  Prior Function  ? Level of Independence Independent   ?  ? Posture/Postural Control  ? Posture/Postural Control Postural limitations   ?  ? Deep Tendon Reflexes  ? DTR Assessment Site Patella;Achilles   ? Patella DTR 2+   ? Achilles DTR 2+   ?  ? ROM / Strength  ? AROM / PROM / Strength AROM;Strength   ?  ? AROM  ? Overall AROM Comments Functional spinal range of motion.  Full bilateral hip flexion assessed in supine.   ?  ? Strength  ? Overall Strength Comments Normal LE strength.   ?  ? Palpation  ? Palpation comment Tender to palpation in region of right SIJ, upper gluteal region and Piriformis region.  Left side exhibits some upper gluteal tenderness but less than right.  Lumbar musculature with increased tone but non-tender to palpation today.   ?  ? Special Tests  ? Other special tests Equal leg lengths, (-) SLR testing. (+) right FABER test.   ?  ? Ambulation/Gait  ? Gait Comments WNL.   ? ?  ?  ? ?  ? ? ? ? ? ? ? ? ? ? ? ? ? ?Objective measurements completed on examination: See above findings.  ? ? ? ? ? [facility] Adult PT Treatment/Exercise - 03/22/22 0001   ? ?  ? Modalities  ? Modalities Electrical Stimulation   ?  ? Electrical  Stimulation  ? Electrical Stimulation Location Right low back/upper glut   ? Electrical Stimulation Action IFC at 80-150 Hz.   ? Electrical Stimulation Parameters 40% scan x 20 minutes.   ? Electrical Stimulation Goals Tone;Pain   ? ?  ?  ? ?  ? ? ? ? ? ? ? ? ? ? ? ? ? ? ? PT Long Term Goals - 03/22/22 1247   ? ?  ? PT LONG TERM GOAL #1  ? Title Independent with an HEP.   ? Time 6   ? Period Weeks   ? Status New   ?  ? PT LONG TERM GOAL #2  ? Title Perform ADL's with pain not > 2/10.   ? Time 6   ? Period Weeks   ? Status New   ?  ? PT LONG TERM GOAL #3  ? Title Care for puppy with pain not > 2/10.   ? Time 4   ? Period Weeks   ? Status New   ? ?  ?  ? ?  ? ? ? ? ? ? ? ? ? Plan - 03/22/22 1211   ? ? Clinical Impression Statement The patient presents to OPPT with a CC today of right-sied low back pain over the last few weeks related to training a puppy.  She was found be be tender to palpation over her right SIJ, upper gluteal region and Piriformis.  She was provided with some stretches today.  The patient is very active and demonstrates normal LE strength and good posture.  Her LE DTR's are normal.  She had pain reproduction with a right FABER test.Patient will benefit from skilled physical therapy intervention to address pain and deficits.   ? Personal Factors and Comorbidities Comorbidity 1;Other   ? Examination-Activity Limitations Locomotion Level;Other   ? Examination-Participation Restrictions Other   ? Stability/Clinical Decision Making Stable/Uncomplicated   ? Clinical Decision Making Low   ? Rehab Potential Excellent   ? PT Frequency 2x / week   ? PT Duration 6 weeks   ?  PT Treatment/Interventions ADLs/Self Care Home Management;Cryotherapy;Electrical Stimulation;Ultrasound;Moist Heat;Therapeutic activities;Therapeutic exercise;Manual techniques;Patient/family education;Passive range of motion   ? PT Next Visit Plan FOTO.Marland KitchenMarland KitchenCombo e'stim/US, STW/M to include bilateral lumbar musculature, SKTC, Piriformis  stretch.  Core exercise progression.   ? Consulted and Agree with Plan of Care Patient   ? ?  ?  ? ?  ? ? ?Patient will benefit from skilled therapeutic intervention in order to improve the following deficits and impairments:  Decreased activity tolerance, Pain, Increased muscle spasms ? ?Visit Diagnosis: ?Other low back pain - Plan: PT plan of care cert/re-cert ? ? ? ? ?Problem List ?Patient Active Problem List  ? Diagnosis Date Noted  ? B12 deficiency 07/30/2019  ? Chronic rhinitis 10/03/2018  ? Thrombocytopenia ([place]) 07/16/2015  ? Adjustment disorder with mixed anxiety and depressed mood 06/03/2015  ? Vitamin D deficiency 06/03/2015  ? Hyperlipidemia 06/03/2015  ? Osteoporosis 01/20/2015  ? HYPOTENSION, UNSPECIFIED 07/13/2010  ? MITRAL REGURGITATION 02/02/2010  ? ? ?Dorianna Mckiver, [place], PT ?03/22/2022, 12:50 PM ? ?[place] ?[facility] ?[place] ?[place], [place], [address] ?Phone: [phone]   Fax:  [phone] ? ?Name: Abagail Limb Rabanal ?MRN: [id number] ?Date of Birth: 09-03-1935 ? ? ?

## 2022-03-27 ENCOUNTER — Telehealth: Payer: Self-pay | Admitting: Family Medicine

## 2022-03-27 ENCOUNTER — Ambulatory Visit: Payer: Medicare Other | Admitting: Physical Therapy

## 2022-03-27 DIAGNOSIS — M5459 Other low back pain: Secondary | ICD-10-CM | POA: Diagnosis not present

## 2022-03-27 DIAGNOSIS — S39012D Strain of muscle, fascia and tendon of lower back, subsequent encounter: Secondary | ICD-10-CM | POA: Diagnosis not present

## 2022-03-27 NOTE — Telephone Encounter (Signed)
Patient needs to make an appointment to be seen with on call provider for xray ?

## 2022-03-27 NOTE — Telephone Encounter (Signed)
Patient aware order already in there she will come tomorrow ?

## 2022-03-27 NOTE — Telephone Encounter (Signed)
Patient already has order from dettinger will come in then  ?

## 2022-03-27 NOTE — Therapy (Addendum)
[facility] [place], [place], [address] Phone: [phone]   Fax:  [phone]  Physical Therapy Treatment  Patient Details  Name: Rose Martinez MRN: [id number] Date of Birth: Apr 30, 1935 Referring Provider (PT): Caryl Pina MD   Encounter Date: 03/27/2022   PT End of Session - 03/27/22 1009     Visit Number 2    Number of Visits 12    Date for PT Re-Evaluation 06/21/22    PT Start Time 0913    PT Stop Time 1011    PT Time Calculation (min) 58 min    Activity Tolerance Patient tolerated treatment well    Behavior During Therapy [facility] for tasks assessed/performed             Past Medical History:  Diagnosis Date   Anxiety    Hyperlipidemia    Mild   Osteoporosis    Palpitations     Past Surgical History:  Procedure Laterality Date   CYSTECTOMY     Breast   EYE SURGERY     OOPHORECTOMY      There were no vitals filed for this visit.   Subjective Assessment - 03/27/22 1506     Subjective Pain about a 5 today without medication.                               [facility] Adult PT Treatment/Exercise - 03/27/22 0001       Modalities   Modalities Electrical Stimulation;Ultrasound      Acupuncturist Location LB    Electrical Stimulation Action IFC at 80-150 Hz.    Electrical Stimulation Parameters 40% scan x 22 minutes.    Electrical Stimulation Goals Pain;Tone      Ultrasound   Ultrasound Location Patient in left sdly position with folded pillow between knees for comfort:  Combo e'stim/US at 1.50 W/CM2 x 12 minutes.      Manual Therapy   Manual Therapy Soft tissue mobilization    Manual therapy comments STW/M x 14 minutes to patient's bilateral lumbar musculature and right Piriformis region with ischemic release technique to reduce pain and tone.                          PT Long Term Goals - 03/22/22 1247       PT LONG  TERM GOAL #1   Title Independent with an HEP.    Time 6    Period Weeks    Status New      PT LONG TERM GOAL #2   Title Perform ADL's with pain not > 2/10.    Time 6    Period Weeks    Status New      PT LONG TERM GOAL #3   Title Care for puppy with pain not > 2/10.    Time 4    Period Weeks    Status New                   Plan - 03/27/22 1027     Clinical Impression Statement The patient with a lowered pain-level upon presentation to the clinic today without medication.  She tolerated treatment without complaint today.  She was specifically tender to palpation over her right SIJ and Piriformis and did very well with soft tissue work.  Added DKTC and hip bridges to HEP today.  Personal Factors and Comorbidities Comorbidity 1;Other    Examination-Activity Limitations Locomotion Level;Other    Examination-Participation Restrictions Other    Stability/Clinical Decision Making Stable/Uncomplicated    Rehab Potential Excellent    PT Frequency 2x / week    PT Duration 6 weeks    PT Treatment/Interventions ADLs/Self Care Home Management;Cryotherapy;Electrical Stimulation;Ultrasound;Moist Heat;Therapeutic activities;Therapeutic exercise;Manual techniques;Patient/family education;Passive range of motion    Consulted and Agree with Plan of Care Patient             Patient will benefit from skilled therapeutic intervention in order to improve the following deficits and impairments:  Decreased activity tolerance, Pain, Increased muscle spasms  Visit Diagnosis: Other low back pain     Problem List Patient Active Problem List   Diagnosis Date Noted   B12 deficiency 07/30/2019   Chronic rhinitis 10/03/2018   Thrombocytopenia ([place]) 07/16/2015   Adjustment disorder with mixed anxiety and depressed mood 06/03/2015   Vitamin D deficiency 06/03/2015   Hyperlipidemia 06/03/2015   Osteoporosis 01/20/2015   HYPOTENSION, UNSPECIFIED 07/13/2010   MITRAL REGURGITATION  02/02/2010    Jamison Soward, [place], PT 03/27/2022, 3:07 PM  [facility] [address] [place], [place], [address] Phone: [phone]   Fax:  [phone]  Name: Rose Martinez MRN: [id number] Date of Birth: 08/03/1935   PHYSICAL THERAPY DISCHARGE SUMMARY  Visits from Start of Care: 2.  Current functional level related to goals / functional outcomes: See above.   Remaining deficits: See below.   Education / Equipment: HEP.   Patient agrees to discharge. Patient goals were not met. Patient is being discharged due to not returning since the last visit.    [place] Alliyah Roesler MPT

## 2022-03-28 ENCOUNTER — Other Ambulatory Visit (INDEPENDENT_AMBULATORY_CARE_PROVIDER_SITE_OTHER): Payer: Medicare Other

## 2022-03-28 ENCOUNTER — Telehealth: Payer: Self-pay

## 2022-03-28 DIAGNOSIS — S39012A Strain of muscle, fascia and tendon of lower back, initial encounter: Secondary | ICD-10-CM | POA: Diagnosis not present

## 2022-03-28 DIAGNOSIS — M545 Low back pain, unspecified: Secondary | ICD-10-CM | POA: Diagnosis not present

## 2022-03-28 NOTE — Telephone Encounter (Signed)
Called patient, no answer 

## 2022-03-28 NOTE — Telephone Encounter (Signed)
Her x-ray shows mild to moderate arthritis changes ?

## 2022-03-28 NOTE — Telephone Encounter (Signed)
Patient would like results from x-ray of her back. Please review and advise. - Dettinger patient  ?

## 2022-03-30 ENCOUNTER — Ambulatory Visit: Payer: Medicare Other | Admitting: Physical Therapy

## 2022-03-31 NOTE — Telephone Encounter (Signed)
Pt made aware of results. Discussed detailed report with her. Pt is already applying heat and ice daily. Taking Tylenol every am and takes only if needed before going to bed. Pt already goes to PT. Is infrequent because she does not want to disturb anything. Pt will continue what she is doing and will call back if needed. ?

## 2022-04-03 ENCOUNTER — Encounter: Payer: BC Managed Care – PPO | Admitting: Physical Therapy

## 2022-04-05 ENCOUNTER — Encounter: Payer: BC Managed Care – PPO | Admitting: Physical Therapy

## 2022-04-06 DIAGNOSIS — L821 Other seborrheic keratosis: Secondary | ICD-10-CM | POA: Diagnosis not present

## 2022-04-06 DIAGNOSIS — L820 Inflamed seborrheic keratosis: Secondary | ICD-10-CM | POA: Diagnosis not present

## 2022-04-06 DIAGNOSIS — L570 Actinic keratosis: Secondary | ICD-10-CM | POA: Diagnosis not present

## 2022-04-06 DIAGNOSIS — L218 Other seborrheic dermatitis: Secondary | ICD-10-CM | POA: Diagnosis not present

## 2022-04-06 DIAGNOSIS — L812 Freckles: Secondary | ICD-10-CM | POA: Diagnosis not present

## 2022-04-10 ENCOUNTER — Ambulatory Visit: Payer: Medicare Other | Admitting: Physical Therapy

## 2022-04-12 ENCOUNTER — Ambulatory Visit (INDEPENDENT_AMBULATORY_CARE_PROVIDER_SITE_OTHER): Payer: Medicare Other | Admitting: Emergency Medicine

## 2022-04-12 DIAGNOSIS — E538 Deficiency of other specified B group vitamins: Secondary | ICD-10-CM | POA: Diagnosis not present

## 2022-04-12 MED ORDER — CYANOCOBALAMIN 1000 MCG/ML IJ SOLN
1000.0000 ug | Freq: Once | INTRAMUSCULAR | Status: AC
  Administered 2022-04-12: 1000 ug via INTRAMUSCULAR

## 2022-04-24 NOTE — Progress Notes (Signed)
?  ?Cardiology Office Note ? ? ?Date:  04/26/2022  ? ?ID:  Rose Martinez, DOB May 05, 1935, MRN [id number] ? ?PCP:  Dettinger, Fransisca Kaufmann, MD  ?Cardiologist:   None ? ? ? ?Chief Complaint  ?Patient presents with  ? Palpitations  ? ? ?  ?History of Present Illness: ?Rose Martinez is a 87 y.o. female who presents for followup of palpitations.  She was going to wear a monitor but did not think that she could apply it herself.  However, she now wants to wait until it is available and [facility].  She has not had any new palpitations.  She has not had any new presyncope or syncope.  She denies any chest pressure, neck or arm discomfort.  She is training a new puppy.  She thinks she pulled some muscles because of this. ? ?She does have some dizziness occasionally.  She says that this happens when she turns quickly. ? ? ?Past Medical History:  ?Diagnosis Date  ? Anxiety   ? Hyperlipidemia   ? Mild  ? Osteoporosis   ? Palpitations   ? ? ?Past Surgical History:  ?Procedure Laterality Date  ? CYSTECTOMY    ? Breast  ? EYE SURGERY    ? OOPHORECTOMY    ? ? ? ?Current Outpatient Medications  ?Medication Sig Dispense Refill  ? ALPRAZolam (XANAX) 0.25 MG tablet TAKE (1/2) TABLET UP TO THREE TIMES DAILY. 45 tablet 1  ? betamethasone dipropionate 0.05 % lotion Apply topically as needed.    ? betamethasone, augmented, (DIPROLENE) 0.05 % lotion Apply 1 application topically daily.    ? cholecalciferol (VITAMIN D) 1000 UNITS tablet Take 2,000 Units by mouth daily.     ? CIPRODEX OTIC suspension Place 4 drops into the left ear in the morning, at noon, in the evening, and at bedtime.    ? diclofenac (VOLTAREN) 75 MG EC tablet Take 1 tablet (75 mg total) by mouth 2 (two) times daily. For arthritis and pain 60 tablet 1  ? DULoxetine (CYMBALTA) 30 MG capsule Take 1 capsule (30 mg total) by mouth at bedtime. For depression 30 capsule 1  ? fluticasone (FLONASE) 50 MCG/ACT nasal spray 1 SPRAY IN EACH NOSTRIL ONCE DAILY. 16 g 11  ? Hydrocortisone  Butyrate 0.1 % LOTN Apply 1 application topically as needed.    ? loratadine (CLARITIN) 10 MG tablet Take 10 mg by mouth daily.    ? mirabegron ER (MYRBETRIQ) 25 MG TB24 tablet Take 1 tablet (25 mg total) by mouth daily. 90 tablet 3  ? montelukast (SINGULAIR) 10 MG tablet TAKE (1) TABLET DAILY AS DIRECTED. 30 tablet 0  ? mupirocin ointment (BACTROBAN) 2 % Apply 1 application topically 2 (two) times daily. to affected area(s)    ? Probiotic Product (ALIGN PO) Take 1 capsule by mouth daily.    ? propranolol (INDERAL) 20 MG tablet Take 1 tablet (20 mg total) by mouth daily as needed. 30 tablet 0  ? pseudoephedrine-guaifenesin (MUCINEX D) 60-600 MG 12 hr tablet Take 1 table 1-2 times daily 30 tablet 11  ? rosuvastatin (CRESTOR) 5 MG tablet Take 1 tablet (5 mg total) by mouth at bedtime. 90 tablet 3  ? sertraline (ZOLOFT) 25 MG tablet TAKE ONE TABLET ONCE DAILY 30 tablet 3  ? baclofen (LIORESAL) 10 MG tablet Take 1 tablet (10 mg total) by mouth at bedtime as needed for muscle spasms. (Patient not taking: Reported on 03/20/2022) 20 each 0  ? ?No current facility-administered medications for this  visit.  ? ? ?Allergies:   Statins, Cephalexin, and Penicillins  ? ?ROS:  Please see the history of present illness.   Otherwise, review of systems are positive for none.   All other systems are reviewed and negative.  ? ? ?PHYSICAL EXAM: ?VS:  BP 120/70   Pulse 68   Ht '5\' 5"'$  (1.651 m)   Wt 128 lb 3.2 oz (58.2 kg)   SpO2 98%   BMI 21.33 kg/m?  , BMI Body mass index is 21.33 kg/m?. ?GENERAL:  Well appearing ?NECK:  No jugular venous distention, waveform within normal limits, carotid upstroke brisk and symmetric, no bruits, no thyromegaly ?LUNGS:  Clear to auscultation bilaterally ?CHEST:  Unremarkable ?HEART:  PMI not displaced or sustained,S1 and S2 within normal limits, no S3, no S4, no clicks, no rubs, no murmurs ?ABD:  Flat, positive bowel sounds normal in frequency in pitch, no bruits, no rebound, no guarding, no midline  pulsatile mass, no hepatomegaly, no splenomegaly ?EXT:  2 plus pulses throughout, no edema, no cyanosis no clubbing ? ? ?EKG:  EKG is not ordered today. ?NA ? ? ?Recent Labs: ?07/28/2021: TSH 1.460 ?02/01/2022: ALT 13; BUN 17; Creatinine, Ser 0.69; Hemoglobin 13.3; Platelets 144; Potassium 5.0; Sodium 142  ? ? ?Lipid Panel ?   ?Component Value Date/Time  ? CHOL 159 02/01/2022 0807  ? TRIG 68 02/01/2022 0807  ? TRIG 90 03/30/2017 0811  ? HDL 57 02/01/2022 0807  ? HDL 71 03/30/2017 0811  ? CHOLHDL 2.8 02/01/2022 0807  ? [place] 89 02/01/2022 0807  ? [place] 126 (H) 05/06/2014 0803  ? ?  ? ?Wt Readings from Last 3 Encounters:  ?04/26/22 128 lb 3.2 oz (58.2 kg)  ?03/20/22 126 lb 9.6 oz (57.4 kg)  ?03/09/22 128 lb (58.1 kg)  ?  ? ? ?Other studies Reviewed: ?Additional studies/ records that were reviewed today include: Labs. ?Review of the above records demonstrates:  Please see elsewhere in the note.   ? ? ?ASSESSMENT AND PLAN: ? ?PALPITATION:    The patient has no new symptoms.  No change in therapy.  She can wear a monitor when they are available.  She wants to have somebody at [facility] apply it.  I do not think there is any hurry to get it.  She has had labs that are unremarkable including electrolytes.  TSH last year was unremarkable. ? ?DYSLIPIDEMIA:   LDL was 89 with an HDL of 57.  No change in therapy. ? ? ?Current medicines are reviewed at length with the patient today.  The patient does not have concerns regarding medicines. ? ?The following changes have been made:  None ? ?Labs/ tests ordered today include:   None ? ?No orders of the defined types were placed in this encounter. ? ? ?Disposition:   FU with me in six months per her request.  ? ? ? ?Signed, ?Minus Breeding, MD  ?04/26/2022 10:05 AM    ?[place] ? ?

## 2022-04-26 ENCOUNTER — Encounter: Payer: Self-pay | Admitting: Cardiology

## 2022-04-26 ENCOUNTER — Ambulatory Visit (INDEPENDENT_AMBULATORY_CARE_PROVIDER_SITE_OTHER): Payer: Medicare Other | Admitting: Cardiology

## 2022-04-26 VITALS — BP 120/70 | HR 68 | Ht 65.0 in | Wt 128.2 lb

## 2022-04-26 DIAGNOSIS — R002 Palpitations: Secondary | ICD-10-CM | POA: Diagnosis not present

## 2022-04-26 NOTE — Patient Instructions (Signed)
Medication Instructions:  The current medical regimen is effective;  continue present plan and medications.  *If you need a refill on your cardiac medications before your next appointment, please call your pharmacy*  Follow-Up: At [facility], you and your health needs are our priority.  As part of our continuing mission to provide you with exceptional heart care, we have created designated Provider Care Teams.  These Care Teams include your primary Cardiologist (physician) and Advanced Practice Providers (APPs -  Physician Assistants and Nurse Practitioners) who all work together to provide you with the care you need, when you need it.  We recommend signing up for the patient portal called "MyChart".  Sign up information is provided on this After Visit Summary.  MyChart is used to connect with patients for Virtual Visits (Telemedicine).  Patients are able to view lab/test results, encounter notes, upcoming appointments, etc.  Non-urgent messages can be sent to your provider as well.   To learn more about what you can do with MyChart, go to [email or web address].    Your next appointment:   6 month(s)  The format for your next appointment:   In Person  Provider:   James Hochrein, MD{    Important Information About Sugar       

## 2022-05-05 ENCOUNTER — Ambulatory Visit: Payer: Medicare Other | Admitting: Family Medicine

## 2022-05-05 ENCOUNTER — Ambulatory Visit (INDEPENDENT_AMBULATORY_CARE_PROVIDER_SITE_OTHER): Payer: Medicare Other | Admitting: Family Medicine

## 2022-05-05 ENCOUNTER — Telehealth: Payer: Self-pay | Admitting: Family Medicine

## 2022-05-05 ENCOUNTER — Encounter: Payer: Self-pay | Admitting: Family Medicine

## 2022-05-05 VITALS — BP 113/65 | HR 70 | Temp 97.9°F | Ht 65.0 in | Wt 126.0 lb

## 2022-05-05 DIAGNOSIS — F419 Anxiety disorder, unspecified: Secondary | ICD-10-CM

## 2022-05-05 DIAGNOSIS — R4589 Other symptoms and signs involving emotional state: Secondary | ICD-10-CM

## 2022-05-05 DIAGNOSIS — M533 Sacrococcygeal disorders, not elsewhere classified: Secondary | ICD-10-CM | POA: Diagnosis not present

## 2022-05-05 DIAGNOSIS — F4323 Adjustment disorder with mixed anxiety and depressed mood: Secondary | ICD-10-CM | POA: Diagnosis not present

## 2022-05-05 DIAGNOSIS — G5701 Lesion of sciatic nerve, right lower limb: Secondary | ICD-10-CM

## 2022-05-05 DIAGNOSIS — E782 Mixed hyperlipidemia: Secondary | ICD-10-CM | POA: Diagnosis not present

## 2022-05-05 DIAGNOSIS — N3281 Overactive bladder: Secondary | ICD-10-CM

## 2022-05-05 MED ORDER — DULOXETINE HCL 30 MG PO CPEP: 30.0000 mg | ORAL_CAPSULE | Freq: Every day | ORAL | 1 refills | Status: DC

## 2022-05-05 MED ORDER — DICLOFENAC SODIUM 75 MG PO TBEC: 75.0000 mg | DELAYED_RELEASE_TABLET | Freq: Two times a day (BID) | ORAL | 1 refills | Status: DC

## 2022-05-05 MED ORDER — MIRABEGRON ER 25 MG PO TB24: 25.0000 mg | ORAL_TABLET | Freq: Every day | ORAL | 3 refills | Status: DC

## 2022-05-05 MED ORDER — ALPRAZOLAM 0.25 MG PO TABS: ORAL_TABLET | ORAL | 1 refills | Status: DC

## 2022-05-05 MED ORDER — SERTRALINE HCL 25 MG PO TABS: 25.0000 mg | ORAL_TABLET | Freq: Every day | ORAL | 1 refills | Status: DC

## 2022-05-05 NOTE — Progress Notes (Signed)
BP 113/65   Pulse 70   Temp 97.9 F (36.6 C)   Ht _0  (1.651 m)   Wt 126 lb (57.2 kg)   SpO2 97%   BMI 20.97 kg/m    Subjective:   Patient ID: Rose Martinez, female    DOB: May 02, 1935, 87 y.o.   MRN: [id number]  HPI: Rose Martinez is a 87 y.o. female presenting on 05/05/2022 for Medical Management of Chronic Issues, Hyperlipidemia, and Anxiety   HPI Anxiety recheck Patient is coming in today for anxiety recheck.  She is a refill as 1.25.  She does not use it very consistently but uses it infrequently.  Its been almost 3 or 4 months since she refilled her last prescription.  We will do a refill for her.  Doing a lot better now that she is feeling better and has her puppy  Hyperlipidemia Patient is coming in for recheck of his hyperlipidemia. The patient is currently taking Crestor. They deny any issues with myalgias or history of liver damage from it. They deny any focal numbness or weakness or chest pain.   OAB Patient takes Myrbetriq for overactive bladder and she does feel like it is helping her.  She does still get up couple times at night to urinate but it is much better.  Relevant past medical, surgical, family and social history reviewed and updated as indicated. Interim medical history since our last visit reviewed. Allergies and medications reviewed and updated.  Review of Systems  Constitutional:  Negative for chills and fever.  HENT:  Negative for congestion, ear discharge and ear pain.   Eyes:  Negative for redness and visual disturbance.  Respiratory:  Negative for chest tightness and shortness of breath.   Cardiovascular:  Negative for chest pain and leg swelling.  Genitourinary:  Negative for difficulty urinating and dysuria.  Musculoskeletal:  Negative for back pain and gait problem.  Skin:  Negative for rash.  Neurological:  Negative for light-headedness and headaches.  Psychiatric/Behavioral:  Negative for agitation and behavioral problems.   All other  systems reviewed and are negative.  Per HPI unless specifically indicated above   Allergies as of 05/05/2022       Reactions   Statins    myalgias   Cephalexin Other (See Comments)   Pt does not remember   Penicillins Other (See Comments)   Pt's mother was severly allergic, so she has never taken PCN.        Medication List        Accurate as of May 05, 2022  9:01 AM. If you have any questions, ask your nurse or doctor.          ALIGN PO Take 1 capsule by mouth daily.   ALPRAZolam 0.25 MG tablet Commonly known as: XANAX TAKE (1/2) TABLET UP TO THREE TIMES DAILY.   baclofen 10 MG tablet Commonly known as: LIORESAL Take 1 tablet (10 mg total) by mouth at bedtime as needed for muscle spasms.   betamethasone (augmented) 0.05 % lotion Commonly known as: DIPROLENE Apply 1 application topically daily.   betamethasone dipropionate 0.05 % lotion Apply topically as needed.   cholecalciferol 1000 units tablet Commonly known as: VITAMIN D Take 2,000 Units by mouth daily.   Ciprodex OTIC suspension Generic drug: ciprofloxacin-dexamethasone Place 4 drops into the left ear in the morning, at noon, in the evening, and at bedtime.   diclofenac 75 MG EC tablet Commonly known as: VOLTAREN Take 1 tablet (75 mg  total) by mouth 2 (two) times daily. For arthritis and pain   DULoxetine 30 MG capsule Commonly known as: Cymbalta Take 1 capsule (30 mg total) by mouth at bedtime. For depression   fluticasone 50 MCG/ACT nasal spray Commonly known as: FLONASE 1 SPRAY IN EACH NOSTRIL ONCE DAILY.   Hydrocortisone Butyrate 0.1 % Lotn Apply 1 application topically as needed.   loratadine 10 MG tablet Commonly known as: CLARITIN Take 10 mg by mouth daily.   mirabegron ER 25 MG Tb24 tablet Commonly known as: Myrbetriq Take 1 tablet (25 mg total) by mouth daily.   montelukast 10 MG tablet Commonly known as: SINGULAIR TAKE (1) TABLET DAILY AS DIRECTED.   mupirocin ointment 2  % Commonly known as: BACTROBAN Apply 1 application topically 2 (two) times daily. to affected area(s)   propranolol 20 MG tablet Commonly known as: INDERAL Take 1 tablet (20 mg total) by mouth daily as needed.   pseudoephedrine-guaifenesin 60-600 MG 12 hr tablet Commonly known as: MUCINEX D Take 1 table 1-2 times daily   rosuvastatin 5 MG tablet Commonly known as: CRESTOR Take 1 tablet (5 mg total) by mouth at bedtime.   sertraline 25 MG tablet Commonly known as: ZOLOFT Take 1 tablet (25 mg total) by mouth daily.         Objective:   BP 113/65   Pulse 70   Temp 97.9 F (36.6 C)   Ht _0  (1.651 m)   Wt 126 lb (57.2 kg)   SpO2 97%   BMI 20.97 kg/m   Wt Readings from Last 3 Encounters:  05/05/22 126 lb (57.2 kg)  04/26/22 128 lb 3.2 oz (58.2 kg)  03/20/22 126 lb 9.6 oz (57.4 kg)    Physical Exam Vitals and nursing note reviewed.  Constitutional:      General: She is not in acute distress.    Appearance: She is well-developed. She is not diaphoretic.  Eyes:     Conjunctiva/sclera: Conjunctivae normal.  Cardiovascular:     Rate and Rhythm: Normal rate and regular rhythm.     Heart sounds: Normal heart sounds. No murmur heard. Pulmonary:     Effort: Pulmonary effort is normal. No respiratory distress.     Breath sounds: Normal breath sounds. No wheezing.  Musculoskeletal:        General: No swelling or tenderness. Normal range of motion.  Skin:    General: Skin is warm and dry.     Findings: No rash.  Neurological:     Mental Status: She is alert and oriented to person, place, and time.     Coordination: Coordination normal.  Psychiatric:        Mood and Affect: Mood is anxious and depressed.        Behavior: Behavior normal.        Thought Content: Thought content does not include suicidal ideation. Thought content does not include suicidal plan.      Assessment & Plan:   Problem List Items Addressed This Visit       Other   Adjustment disorder  with mixed anxiety and depressed mood - Primary   Relevant Orders   TSH   Hyperlipidemia   Relevant Orders   CBC with Differential/Platelet   CMP14+EGFR   Other Visit Diagnoses     SI (sacroiliac) joint dysfunction       Relevant Medications   diclofenac (VOLTAREN) 75 MG EC tablet   DULoxetine (CYMBALTA) 30 MG capsule   Sciatic nerve disease, right  Relevant Medications   ALPRAZolam (XANAX) 0.25 MG tablet   diclofenac (VOLTAREN) 75 MG EC tablet   DULoxetine (CYMBALTA) 30 MG capsule   sertraline (ZOLOFT) 25 MG tablet   Depressed mood       Relevant Medications   DULoxetine (CYMBALTA) 30 MG capsule   OAB (overactive bladder)       Relevant Medications   mirabegron ER (MYRBETRIQ) 25 MG TB24 tablet   Anxiety       Relevant Medications   ALPRAZolam (XANAX) 0.25 MG tablet   DULoxetine (CYMBALTA) 30 MG capsule   sertraline (ZOLOFT) 25 MG tablet   Other Relevant Orders   TSH       Continue current medicine, seems to be doing well, will do blood work today Follow up plan: Return in about 6 months (around 11/05/2022), or if symptoms worsen or fail to improve, for Hyperlipidemia and anxiety and overactive bladder.  Counseling provided for all of the vaccine components Orders Placed This Encounter  Procedures   TSH   CBC with Differential/Platelet   CMP14+EGFR    Caryl Pina, MD [facility] 05/05/2022, 9:01 AM

## 2022-05-05 NOTE — Telephone Encounter (Signed)
Pt does wants duloxetine removed from her medication list. She does have one bottle at home but has never taken the medication.  Pt did need xanax and myrbetriq refilled before her next appt and explained why it was sent to pharmacy. Pt states that the pharmacy is going to take them back.  Pt denies needing diclofenac and did not need the rx sent. She states that [facility] is going to take it back as well.  Discussed with pt that prior to nurse pending medication that it will be discussed with pt what she needs refills of when she is in the office the day of her visit.  Pt would also like for instructions of Xanax be changed to 1/2 tab as needed. She never takes TID. States that she takes about 1/2 tab once per month. Instructions changed.

## 2022-05-06 LAB — CMP14+EGFR
ALT: 11 IU/L (ref 0–32)
AST: 13 IU/L (ref 0–40)
Albumin/Globulin Ratio: 2.3 — ABNORMAL HIGH (ref 1.2–2.2)
Albumin: 4.5 g/dL (ref 3.6–4.6)
Alkaline Phosphatase: 86 IU/L (ref 44–121)
BUN/Creatinine Ratio: 24 (ref 12–28)
BUN: 15 mg/dL (ref 8–27)
Bilirubin Total: 0.3 mg/dL (ref 0.0–1.2)
CO2: 25 mmol/L (ref 20–29)
Calcium: 9.4 mg/dL (ref 8.7–10.3)
Chloride: 103 mmol/L (ref 96–106)
Creatinine, Ser: 0.62 mg/dL (ref 0.57–1.00)
Globulin, Total: 2 g/dL (ref 1.5–4.5)
Glucose: 94 mg/dL (ref 70–99)
Potassium: 4.4 mmol/L (ref 3.5–5.2)
Sodium: 147 mmol/L — ABNORMAL HIGH (ref 134–144)
Total Protein: 6.5 g/dL (ref 6.0–8.5)
eGFR: 88 mL/min/{1.73_m2} (ref >59)

## 2022-05-06 LAB — CBC WITH DIFFERENTIAL/PLATELET
Basophils Absolute: 0.1 10*3/uL (ref 0.0–0.2)
Basos: 1 %
EOS (ABSOLUTE): 0.1 10*3/uL (ref 0.0–0.4)
Eos: 1 %
Hematocrit: 39.9 % (ref 34.0–46.6)
Hemoglobin: 13.4 g/dL (ref 11.1–15.9)
Immature Grans (Abs): 0 10*3/uL (ref 0.0–0.1)
Immature Granulocytes: 0 %
Lymphocytes Absolute: 1.6 10*3/uL (ref 0.7–3.1)
Lymphs: 25 %
MCH: 33.8 pg — ABNORMAL HIGH (ref 26.6–33.0)
MCHC: 33.6 g/dL (ref 31.5–35.7)
MCV: 101 fL — ABNORMAL HIGH (ref 79–97)
Monocytes Absolute: 0.5 10*3/uL (ref 0.1–0.9)
Monocytes: 8 %
Neutrophils Absolute: 4.1 10*3/uL (ref 1.4–7.0)
Neutrophils: 65 %
Platelets: 168 10*3/uL (ref 150–450)
RBC: 3.96 x10E6/uL (ref 3.77–5.28)
RDW: 11.5 % — ABNORMAL LOW (ref 11.7–15.4)
WBC: 6.3 10*3/uL (ref 3.4–10.8)

## 2022-05-06 LAB — TSH: TSH: 1.58 u[IU]/mL (ref 0.450–4.500)

## 2022-05-10 ENCOUNTER — Ambulatory Visit (INDEPENDENT_AMBULATORY_CARE_PROVIDER_SITE_OTHER): Payer: Medicare Other

## 2022-05-10 DIAGNOSIS — E538 Deficiency of other specified B group vitamins: Secondary | ICD-10-CM | POA: Diagnosis not present

## 2022-05-10 MED ORDER — CYANOCOBALAMIN 1000 MCG/ML IJ SOLN
1000.0000 ug | Freq: Once | INTRAMUSCULAR | Status: AC
  Administered 2022-05-10: 1000 ug via INTRAMUSCULAR

## 2022-06-01 DIAGNOSIS — Z961 Presence of intraocular lens: Secondary | ICD-10-CM | POA: Diagnosis not present

## 2022-06-01 DIAGNOSIS — H04123 Dry eye syndrome of bilateral lacrimal glands: Secondary | ICD-10-CM | POA: Diagnosis not present

## 2022-06-07 ENCOUNTER — Ambulatory Visit: Payer: Medicare Other | Admitting: Family Medicine

## 2022-06-13 ENCOUNTER — Ambulatory Visit (INDEPENDENT_AMBULATORY_CARE_PROVIDER_SITE_OTHER): Payer: Medicare Other

## 2022-06-13 DIAGNOSIS — E538 Deficiency of other specified B group vitamins: Secondary | ICD-10-CM

## 2022-06-13 MED ORDER — CYANOCOBALAMIN 1000 MCG/ML IJ SOLN
1000.0000 ug | Freq: Once | INTRAMUSCULAR | Status: AC
  Administered 2022-06-13: 1000 ug via INTRAMUSCULAR

## 2022-06-14 ENCOUNTER — Ambulatory Visit: Payer: Medicare Other | Admitting: Family Medicine

## 2022-06-22 DIAGNOSIS — H608X3 Other otitis externa, bilateral: Secondary | ICD-10-CM | POA: Diagnosis not present

## 2022-06-22 DIAGNOSIS — L299 Pruritus, unspecified: Secondary | ICD-10-CM | POA: Diagnosis not present

## 2022-06-22 DIAGNOSIS — H6123 Impacted cerumen, bilateral: Secondary | ICD-10-CM | POA: Diagnosis not present

## 2022-06-22 DIAGNOSIS — H5462 Unqualified visual loss, left eye, normal vision right eye: Secondary | ICD-10-CM | POA: Diagnosis not present

## 2022-06-22 DIAGNOSIS — R42 Dizziness and giddiness: Secondary | ICD-10-CM | POA: Diagnosis not present

## 2022-06-22 DIAGNOSIS — R2689 Other abnormalities of gait and mobility: Secondary | ICD-10-CM | POA: Diagnosis not present

## 2022-06-22 DIAGNOSIS — Z79899 Other long term (current) drug therapy: Secondary | ICD-10-CM | POA: Diagnosis not present

## 2022-06-27 ENCOUNTER — Telehealth: Payer: Self-pay | Admitting: Family Medicine

## 2022-06-27 NOTE — Telephone Encounter (Signed)
Please put her on the schedule for a televisit, will try to call around that time or later.

## 2022-06-27 NOTE — Telephone Encounter (Signed)
Pt would only like to speak with Dr. Warrick Parisian. She has to take her dog to the vet in the am but would like to speak with Dr. Warrick Parisian around 12 on Wednesday.  Will forward message to Dr. Warrick Parisian to make him aware.

## 2022-06-28 ENCOUNTER — Encounter: Payer: Self-pay | Admitting: Family Medicine

## 2022-06-28 ENCOUNTER — Ambulatory Visit (INDEPENDENT_AMBULATORY_CARE_PROVIDER_SITE_OTHER): Payer: Medicare Other | Admitting: Family Medicine

## 2022-06-28 DIAGNOSIS — N3281 Overactive bladder: Secondary | ICD-10-CM

## 2022-06-28 MED ORDER — MIRABEGRON ER 50 MG PO TB24: 50.0000 mg | ORAL_TABLET | Freq: Every day | ORAL | 3 refills | Status: DC

## 2022-06-28 NOTE — Progress Notes (Signed)
Virtual Visit via telephone Note  I connected with Rose Martinez on 06/28/22 at 1307 by telephone and verified that I am speaking with the correct person using two identifiers. Shakeisha Horine Marchi is currently located at home and patient are currently with her during visit. The provider, Fransisca Kaufmann Fabien Travelstead, MD is located in their office at time of visit.  Call ended at 1318  I discussed the limitations, risks, security and privacy concerns of performing an evaluation and management service by telephone and the availability of in person appointments. I also discussed with the patient that there may be a patient responsible charge related to this service. The patient expressed understanding and agreed to proceed.   History and Present Illness: Patient has been taking Myrbetriq and is still awaking 3-4 times per night.  She is not doing well with the medicine and still urinating frequently.    She is having some right leg swelling around the ankles and has been an off and on thing and goes away at night.  She says it just does bother some.   1. OAB (overactive bladder)     Outpatient Encounter Medications as of 06/28/2022  Medication Sig   mirabegron ER (MYRBETRIQ) 50 MG TB24 tablet Take 1 tablet (50 mg total) by mouth daily.   ALPRAZolam (XANAX) 0.25 MG tablet TAKE (1/2) TABLET UP TO THREE TIMES DAILY. (Patient taking differently: Take 0.25 mg by mouth as needed. TAKE (1/2) TABLET UP TO THREE TIMES DAILY.  Patient reports taking 1/2 tablet once per month.)   baclofen (LIORESAL) 10 MG tablet Take 1 tablet (10 mg total) by mouth at bedtime as needed for muscle spasms.   betamethasone dipropionate 0.05 % lotion Apply topically as needed.   betamethasone, augmented, (DIPROLENE) 0.05 % lotion Apply 1 application topically daily.   cholecalciferol (VITAMIN D) 1000 UNITS tablet Take 2,000 Units by mouth daily.    CIPRODEX OTIC suspension Place 4 drops into the left ear in the morning, at noon, in  the evening, and at bedtime.   diclofenac (VOLTAREN) 75 MG EC tablet Take 1 tablet (75 mg total) by mouth 2 (two) times daily. For arthritis and pain   DULoxetine (CYMBALTA) 30 MG capsule Take 1 capsule (30 mg total) by mouth at bedtime. For depression   fluticasone (FLONASE) 50 MCG/ACT nasal spray 1 SPRAY IN EACH NOSTRIL ONCE DAILY.   Hydrocortisone Butyrate 0.1 % LOTN Apply 1 application topically as needed.   loratadine (CLARITIN) 10 MG tablet Take 10 mg by mouth daily.   montelukast (SINGULAIR) 10 MG tablet TAKE (1) TABLET DAILY AS DIRECTED.   mupirocin ointment (BACTROBAN) 2 % Apply 1 application topically 2 (two) times daily. to affected area(s)   Probiotic Product (ALIGN PO) Take 1 capsule by mouth daily.   propranolol (INDERAL) 20 MG tablet Take 1 tablet (20 mg total) by mouth daily as needed.   pseudoephedrine-guaifenesin (MUCINEX D) 60-600 MG 12 hr tablet Take 1 table 1-2 times daily   rosuvastatin (CRESTOR) 5 MG tablet Take 1 tablet (5 mg total) by mouth at bedtime.   sertraline (ZOLOFT) 25 MG tablet Take 1 tablet (25 mg total) by mouth daily.   [DISCONTINUED] mirabegron ER (MYRBETRIQ) 25 MG TB24 tablet Take 1 tablet (25 mg total) by mouth daily.   No facility-administered encounter medications on file as of 06/28/2022.    Review of Systems  Constitutional:  Negative for chills and fever.  Eyes:  Negative for visual disturbance.  Respiratory:  Negative for  chest tightness and shortness of breath.   Cardiovascular:  Negative for chest pain and leg swelling.  Genitourinary:  Positive for frequency and urgency. Negative for difficulty urinating, dysuria, hematuria, vaginal bleeding, vaginal discharge and vaginal pain.  Skin:  Negative for rash.  Psychiatric/Behavioral:  Negative for agitation and behavioral problems.   All other systems reviewed and are negative.   Observations/Objective: Patient sounds comfortable and in no acute distress  Assessment and Plan: Problem List  Items Addressed This Visit       Genitourinary   OAB (overactive bladder)   Relevant Medications   mirabegron ER (MYRBETRIQ) 50 MG TB24 tablet    Will increase Myrbetriq to 50 mg and see if it does better for her.  Gave reassurance on the leg swelling that is likely just due to venous and lymphatic insufficiency with age and to keep her feet up at night and as long as it goes down at night that we should not be worried about it   Follow up plan: Return if symptoms worsen or fail to improve.     I discussed the assessment and treatment plan with the patient. The patient was provided an opportunity to ask questions and all were answered. The patient agreed with the plan and demonstrated an understanding of the instructions.   The patient was advised to call back or seek an in-person evaluation if the symptoms worsen or if the condition fails to improve as anticipated.  The above assessment and management plan was discussed with the patient. The patient verbalized understanding of and has agreed to the management plan. Patient is aware to call the clinic if symptoms persist or worsen. Patient is aware when to return to the clinic for a follow-up visit. Patient educated on when it is appropriate to go to the emergency department.    I provided 11 minutes of non-face-to-face time during this encounter.    Worthy Rancher, MD

## 2022-06-28 NOTE — Telephone Encounter (Signed)
Telephone visit added this afternoon with Dettinger.

## 2022-07-11 ENCOUNTER — Ambulatory Visit: Payer: Medicare Other

## 2022-07-12 ENCOUNTER — Ambulatory Visit: Payer: Medicare Other

## 2022-07-17 ENCOUNTER — Ambulatory Visit: Payer: Medicare Other

## 2022-07-18 ENCOUNTER — Ambulatory Visit (INDEPENDENT_AMBULATORY_CARE_PROVIDER_SITE_OTHER): Payer: Medicare Other

## 2022-07-18 DIAGNOSIS — E538 Deficiency of other specified B group vitamins: Secondary | ICD-10-CM | POA: Diagnosis not present

## 2022-07-18 MED ORDER — CYANOCOBALAMIN 1000 MCG/ML IJ SOLN
1000.0000 ug | INTRAMUSCULAR | Status: AC
  Administered 2022-07-18 – 2023-06-26 (×11): 1000 ug via INTRAMUSCULAR

## 2022-07-18 NOTE — Progress Notes (Signed)
Cyanocobalamin injection given to left deltoid.  Patient tolerated well. 

## 2022-07-20 ENCOUNTER — Encounter: Payer: Self-pay | Admitting: Family Medicine

## 2022-07-20 ENCOUNTER — Ambulatory Visit (INDEPENDENT_AMBULATORY_CARE_PROVIDER_SITE_OTHER): Payer: Medicare Other | Admitting: Family Medicine

## 2022-07-20 VITALS — BP 118/66 | HR 69 | Temp 97.4°F | Ht 65.0 in | Wt 127.0 lb

## 2022-07-20 DIAGNOSIS — S8012XA Contusion of left lower leg, initial encounter: Secondary | ICD-10-CM

## 2022-07-20 NOTE — Progress Notes (Signed)
BP 118/66   Pulse 69   Temp (!) 97.4 F (36.3 C)   Ht '5\' 5"'$  (1.651 m)   Wt 127 lb (57.6 kg)   SpO2 97%   BMI 21.13 kg/m    Subjective:   Patient ID: Rose Martinez, female    DOB: [birth year], 87 y.o.   MRN: [id number]  HPI: Rose Martinez is a 87 y.o. female presenting on 07/20/2022 for Leg Injury (LLE)   HPI Left lower leg wound and contusion Patient is coming in for left lower leg wound and contusion.  She says about 3 weeks ago she was getting a dog crate out of a trailer and hit the front of her left lower leg on the trailer and has been using topical antibiotic cream and dressings and has gotten the wound healed but is still quite swollen and there and she just wanted to have it taken a look at.  She denies any fevers or chills redness warmth or drainage.  She does have some soreness when she walks but not when she stands on it.  Relevant past medical, surgical, family and social history reviewed and updated as indicated. Interim medical history since our last visit reviewed. Allergies and medications reviewed and updated.  Review of Systems  Constitutional:  Negative for chills and fever.  Eyes:  Negative for redness and visual disturbance.  Respiratory:  Negative for chest tightness and shortness of breath.   Cardiovascular:  Negative for chest pain and leg swelling.  Skin:  Positive for color change and wound. Negative for rash.  Psychiatric/Behavioral:  Negative for agitation and behavioral problems.   All other systems reviewed and are negative.   Per HPI unless specifically indicated above   Allergies as of 07/20/2022       Reactions   Statins    myalgias   Cephalexin Other (See Comments)   Pt does not remember   Penicillins Other (See Comments)   Pt's mother was severly allergic, so she has never taken PCN.        Medication List        Accurate as of July 20, 2022  9:16 AM. If you have any questions, ask your nurse or doctor.          ALIGN  PO Take 1 capsule by mouth daily.   ALPRAZolam 0.25 MG tablet Commonly known as: XANAX TAKE (1/2) TABLET UP TO THREE TIMES DAILY. What changed:  how much to take how to take this when to take this reasons to take this additional instructions   baclofen 10 MG tablet Commonly known as: LIORESAL Take 1 tablet (10 mg total) by mouth at bedtime as needed for muscle spasms.   betamethasone (augmented) 0.05 % lotion Commonly known as: DIPROLENE Apply 1 application topically daily.   betamethasone dipropionate 0.05 % lotion Apply topically as needed.   cholecalciferol 1000 units tablet Commonly known as: VITAMIN D Take 2,000 Units by mouth daily.   Ciprodex OTIC suspension Generic drug: ciprofloxacin-dexamethasone Place 4 drops into the left ear in the morning, at noon, in the evening, and at bedtime.   diclofenac 75 MG EC tablet Commonly known as: VOLTAREN Take 1 tablet (75 mg total) by mouth 2 (two) times daily. For arthritis and pain   DULoxetine 30 MG capsule Commonly known as: Cymbalta Take 1 capsule (30 mg total) by mouth at bedtime. For depression   fluticasone 50 MCG/ACT nasal spray Commonly known as: FLONASE 1 SPRAY IN EACH NOSTRIL ONCE  DAILY.   Hydrocortisone Butyrate 0.1 % Lotn Apply 1 application topically as needed.   loratadine 10 MG tablet Commonly known as: CLARITIN Take 10 mg by mouth daily.   mirabegron ER 50 MG Tb24 tablet Commonly known as: Myrbetriq Take 1 tablet (50 mg total) by mouth daily.   montelukast 10 MG tablet Commonly known as: SINGULAIR TAKE (1) TABLET DAILY AS DIRECTED.   mupirocin ointment 2 % Commonly known as: BACTROBAN Apply 1 application topically 2 (two) times daily. to affected area(s)   predniSONE 5 MG tablet Commonly known as: DELTASONE Take 5 mg by mouth as directed.   propranolol 20 MG tablet Commonly known as: INDERAL Take 1 tablet (20 mg total) by mouth daily as needed.   pseudoephedrine-guaifenesin 60-600 MG  12 hr tablet Commonly known as: MUCINEX D Take 1 table 1-2 times daily   rosuvastatin 5 MG tablet Commonly known as: CRESTOR Take 1 tablet (5 mg total) by mouth at bedtime.   sertraline 25 MG tablet Commonly known as: ZOLOFT Take 1 tablet (25 mg total) by mouth daily.         Objective:   BP 118/66   Pulse 69   Temp (!) 97.4 F (36.3 C)   Ht '5\' 5"'$  (1.651 m)   Wt 127 lb (57.6 kg)   SpO2 97%   BMI 21.13 kg/m   Wt Readings from Last 3 Encounters:  07/20/22 127 lb (57.6 kg)  05/05/22 126 lb (57.2 kg)  04/26/22 128 lb 3.2 oz (58.2 kg)    Physical Exam Vitals and nursing note reviewed.  Constitutional:      Appearance: Normal appearance.  Skin:    Findings: Bruising present. No wound.            Assessment & Plan:   Problem List Items Addressed This Visit   None Visit Diagnoses     Contusion of left lower leg, initial encounter    -  Primary       Recommended warm compresses and compression stockings and conservative management, appears wound is healed, will monitor from there Follow up plan: Return if symptoms worsen or fail to improve.  Counseling provided for all of the vaccine components No orders of the defined types were placed in this encounter.   Caryl Pina, MD [facility] 07/20/2022, 9:16 AM

## 2022-07-24 ENCOUNTER — Ambulatory Visit: Payer: Medicare Other | Admitting: Family Medicine

## 2022-07-24 NOTE — Progress Notes (Unsigned)
Cardiology Office Note   Date:  07/25/2022   ID:  Rose Martinez, DOB 1935-04-09, MRN [id number]  PCP:  Dettinger, Fransisca Kaufmann, MD  Cardiologist:   None    Chief Complaint  Patient presents with   Head Fullness     History of Present Illness: Rose Martinez is a 87 y.o. female who presents for followup of leg weakness.  She says she just "feeling myself."  She went over to see her primary care just to get a blood pressure yesterday but it was within normal limits as was her heart rate.  She is having lots of issues such as swaying when she walks with some staggering.  She feels like her head is full.  She feels like there is a strain between her eyes.  She feels better after lunch.  She wakes up in the morning with energy but then it goes down.  She feels like she is walking on cushions on the bottom of her feet.  Her legs are weak but not all of the time.  She has occasional palpitations that have not changed in frequency or intensity.  She had no presyncope or syncope.  He has had no chest pressure, neck or arm discomfort.  She had no weight gain or edema.  She has had no fevers or chills.   Past Medical History:  Diagnosis Date   Anxiety    Hyperlipidemia    Mild   Osteoporosis    Palpitations     Past Surgical History:  Procedure Laterality Date   CYSTECTOMY     Breast   EYE SURGERY     OOPHORECTOMY       Current Outpatient Medications  Medication Sig Dispense Refill   ALPRAZolam (XANAX) 0.25 MG tablet TAKE (1/2) TABLET UP TO THREE TIMES DAILY. (Patient taking differently: Take 0.25 mg by mouth as needed. TAKE (1/2) TABLET UP TO THREE TIMES DAILY.  Patient reports taking 1/2 tablet once per month.) 45 tablet 1   baclofen (LIORESAL) 10 MG tablet Take 1 tablet (10 mg total) by mouth at bedtime as needed for muscle spasms. 20 each 0   betamethasone dipropionate 0.05 % lotion Apply topically as needed.     betamethasone, augmented, (DIPROLENE) 0.05 % lotion Apply 1  application topically daily.     cholecalciferol (VITAMIN D) 1000 UNITS tablet Take 2,000 Units by mouth daily.      CIPRODEX OTIC suspension Place 4 drops into the left ear in the morning, at noon, in the evening, and at bedtime.     diclofenac (VOLTAREN) 75 MG EC tablet Take 1 tablet (75 mg total) by mouth 2 (two) times daily. For arthritis and pain 180 tablet 1   DULoxetine (CYMBALTA) 30 MG capsule Take 1 capsule (30 mg total) by mouth at bedtime. For depression 90 capsule 1   fluticasone (FLONASE) 50 MCG/ACT nasal spray 1 SPRAY IN EACH NOSTRIL ONCE DAILY. 16 g 11   Hydrocortisone Butyrate 0.1 % LOTN Apply 1 application topically as needed.     loratadine (CLARITIN) 10 MG tablet Take 10 mg by mouth daily.     mirabegron ER (MYRBETRIQ) 50 MG TB24 tablet Take 1 tablet (50 mg total) by mouth daily. 30 tablet 3   montelukast (SINGULAIR) 10 MG tablet TAKE (1) TABLET DAILY AS DIRECTED. 30 tablet 0   mupirocin ointment (BACTROBAN) 2 % Apply 1 application topically 2 (two) times daily. to affected area(s)     predniSONE (DELTASONE) 5 MG  tablet Take 5 mg by mouth as directed.     Probiotic Product (ALIGN PO) Take 1 capsule by mouth daily.     propranolol (INDERAL) 20 MG tablet Take 1 tablet (20 mg total) by mouth daily as needed. 30 tablet 0   pseudoephedrine-guaifenesin (MUCINEX D) 60-600 MG 12 hr tablet Take 1 table 1-2 times daily 30 tablet 11   rosuvastatin (CRESTOR) 5 MG tablet Take 1 tablet (5 mg total) by mouth at bedtime. 90 tablet 3   sertraline (ZOLOFT) 25 MG tablet Take 1 tablet (25 mg total) by mouth daily. 90 tablet 1   Current Facility-Administered Medications  Medication Dose Route Frequency Provider Last Rate Last Admin   cyanocobalamin (VITAMIN B12) injection 1,000 mcg  1,000 mcg Intramuscular Q30 days Dettinger, Fransisca Kaufmann, MD   1,000 mcg at 07/18/22 1505    Allergies:   Statins, Cephalexin, and Penicillins   ROS:  Please see the history of present illness.   Otherwise, review of  systems are positive for none.   All other systems are reviewed and negative.    PHYSICAL EXAM: VS:  BP 120/62   Pulse 63   Ht '5\' 5"'$  (1.651 m)   Wt 126 lb 12.8 oz (57.5 kg)   SpO2 (!) 84%   BMI 21.10 kg/m  , BMI Body mass index is 21.1 kg/m. GENERAL:  Well appearing NECK:  No jugular venous distention, waveform within normal limits, carotid upstroke brisk and symmetric, no bruits, no thyromegaly LUNGS:  Clear to auscultation bilaterally CHEST:  Unremarkable HEART:  PMI not displaced or sustained,S1 and S2 within normal limits, no S3, no S4, no clicks, no rubs, no murmurs ABD:  Flat, positive bowel sounds normal in frequency in pitch, no bruits, no rebound, no guarding, no midline pulsatile mass, no hepatomegaly, no splenomegaly EXT:  2 plus pulses throughout, no edema, no cyanosis no clubbing, left leg hematoma   EKG:  EKG is  ordered today. Normal sinus rhythm, rate 63, left axis deviation, premature atrial contractions, poor anterior R wave progression, low voltage in the limb and chest leads, no change from previous.   Recent Labs: 05/05/2022: ALT 11; BUN 15; Creatinine, Ser 0.62; Hemoglobin 13.4; Platelets 168; Potassium 4.4; Sodium 147; TSH 1.580    Lipid Panel    Component Value Date/Time   CHOL 159 02/01/2022 0807   TRIG 68 02/01/2022 0807   TRIG 90 03/30/2017 0811   HDL 57 02/01/2022 0807   HDL 71 03/30/2017 0811   CHOLHDL 2.8 02/01/2022 0807   LDLCALC 89 02/01/2022 0807   LDLCALC 126 (H) 05/06/2014 0803      Wt Readings from Last 3 Encounters:  07/25/22 126 lb 12.8 oz (57.5 kg)  07/20/22 127 lb (57.6 kg)  05/05/22 126 lb (57.2 kg)      Other studies Reviewed: Additional studies/ records that were reviewed today include: None. Review of the above records demonstrates:  Please see elsewhere in the note.     ASSESSMENT AND PLAN:  PALPITATION:   The patient is having no increased symptoms.  No change in therapy.  DYSLIPIDEMIA:   LDL was 89 with an HDL of  57.  No change in therapy  HEAD FULLNESS: I have suggested management with daily Flonase and saline nasal spray  LEG WEAKNESS: I do not suspect cardiovascular etiology.  We talked about exercises and avoidance of falls.  Current medicines are reviewed at length with the patient today.  The patient does not have concerns regarding medicines.  The following changes have been made:  None  Labs/ tests ordered today include:   None  Orders Placed This Encounter  Procedures   EKG 12-Lead    Disposition:   FU with me  in Nov at previously scheduled appointment.    Signed, Minus Breeding, MD  07/25/2022 5:10 PM    [facility]

## 2022-07-25 ENCOUNTER — Ambulatory Visit (INDEPENDENT_AMBULATORY_CARE_PROVIDER_SITE_OTHER): Payer: Medicare Other | Admitting: Cardiology

## 2022-07-25 ENCOUNTER — Encounter: Payer: Self-pay | Admitting: Cardiology

## 2022-07-25 VITALS — BP 120/62 | HR 63 | Ht 65.0 in | Wt 126.8 lb

## 2022-07-25 DIAGNOSIS — R002 Palpitations: Secondary | ICD-10-CM

## 2022-07-25 DIAGNOSIS — E785 Hyperlipidemia, unspecified: Secondary | ICD-10-CM | POA: Diagnosis not present

## 2022-07-25 NOTE — Patient Instructions (Signed)
Medication Instructions:   Fluconazole(flonase) nasal spray 1 spray each nostril twice daily   *If you need a refill on your cardiac medications before your next appointment, please call your pharmacy*   Follow-Up: At [facility], you and your health needs are our priority.  As part of our continuing mission to provide you with exceptional heart care, we have created designated Provider Care Teams.  These Care Teams include your primary Cardiologist (physician) and Advanced Practice Providers (APPs -  Physician Assistants and Nurse Practitioners) who all work together to provide you with the care you need, when you need it.  We recommend signing up for the patient portal called "MyChart".  Sign up information is provided on this After Visit Summary.  MyChart is used to connect with patients for Virtual Visits (Telemedicine).  Patients are able to view lab/test results, encounter notes, upcoming appointments, etc.  Non-urgent messages can be sent to your provider as well.   To learn more about what you can do with MyChart, go to [email or web address].    Your next appointment:   6 month(s)  The format for your next appointment:   In Person  Provider:   Minus Breeding MD in [place]

## 2022-07-28 ENCOUNTER — Telehealth: Payer: Self-pay | Admitting: Family Medicine

## 2022-07-28 NOTE — Telephone Encounter (Signed)
Patient states she thinks she might need depression med's. She has seen you recently and was prescribed cymbalta '30mg'$  and sertraline do you think she soul take both together?

## 2022-07-28 NOTE — Telephone Encounter (Signed)
Which one do you recommend ?

## 2022-07-28 NOTE — Telephone Encounter (Signed)
I would not take both, I would either take 1 or the other, the Cymbalta or the sertraline.

## 2022-07-31 NOTE — Telephone Encounter (Signed)
Patient return call. ?

## 2022-07-31 NOTE — Telephone Encounter (Signed)
Lmtcb.

## 2022-07-31 NOTE — Telephone Encounter (Signed)
Patient aware and verbalizes understanding. 

## 2022-07-31 NOTE — Telephone Encounter (Signed)
Cymbalta helps more with aches and pains so may be try that 1 first, I do like it is a medicine.  Recommend to take the Cymbalta in the evening before bedtime once a day

## 2022-08-10 ENCOUNTER — Ambulatory Visit: Payer: Medicare Other | Admitting: Family Medicine

## 2022-08-15 ENCOUNTER — Ambulatory Visit (INDEPENDENT_AMBULATORY_CARE_PROVIDER_SITE_OTHER): Payer: Medicare Other | Admitting: *Deleted

## 2022-08-15 DIAGNOSIS — E538 Deficiency of other specified B group vitamins: Secondary | ICD-10-CM | POA: Diagnosis not present

## 2022-08-22 ENCOUNTER — Ambulatory Visit: Payer: Medicare Other

## 2022-09-12 ENCOUNTER — Ambulatory Visit (INDEPENDENT_AMBULATORY_CARE_PROVIDER_SITE_OTHER): Payer: Medicare Other

## 2022-09-12 DIAGNOSIS — E538 Deficiency of other specified B group vitamins: Secondary | ICD-10-CM | POA: Diagnosis not present

## 2022-09-12 NOTE — Progress Notes (Signed)
B12 injection given to patient and tolerated well.  

## 2022-10-12 ENCOUNTER — Ambulatory Visit (INDEPENDENT_AMBULATORY_CARE_PROVIDER_SITE_OTHER): Payer: Medicare Other | Admitting: *Deleted

## 2022-10-12 DIAGNOSIS — E538 Deficiency of other specified B group vitamins: Secondary | ICD-10-CM

## 2022-10-16 DIAGNOSIS — L720 Epidermal cyst: Secondary | ICD-10-CM | POA: Diagnosis not present

## 2022-10-16 DIAGNOSIS — L814 Other melanin hyperpigmentation: Secondary | ICD-10-CM | POA: Diagnosis not present

## 2022-10-16 DIAGNOSIS — D2239 Melanocytic nevi of other parts of face: Secondary | ICD-10-CM | POA: Diagnosis not present

## 2022-10-16 DIAGNOSIS — L812 Freckles: Secondary | ICD-10-CM | POA: Diagnosis not present

## 2022-10-16 DIAGNOSIS — L218 Other seborrheic dermatitis: Secondary | ICD-10-CM | POA: Diagnosis not present

## 2022-10-16 DIAGNOSIS — D225 Melanocytic nevi of trunk: Secondary | ICD-10-CM | POA: Diagnosis not present

## 2022-10-16 DIAGNOSIS — D2262 Melanocytic nevi of left upper limb, including shoulder: Secondary | ICD-10-CM | POA: Diagnosis not present

## 2022-10-16 DIAGNOSIS — D2261 Melanocytic nevi of right upper limb, including shoulder: Secondary | ICD-10-CM | POA: Diagnosis not present

## 2022-10-16 DIAGNOSIS — D2271 Melanocytic nevi of right lower limb, including hip: Secondary | ICD-10-CM | POA: Diagnosis not present

## 2022-10-16 DIAGNOSIS — L820 Inflamed seborrheic keratosis: Secondary | ICD-10-CM | POA: Diagnosis not present

## 2022-10-16 DIAGNOSIS — D2272 Melanocytic nevi of left lower limb, including hip: Secondary | ICD-10-CM | POA: Diagnosis not present

## 2022-10-16 DIAGNOSIS — L821 Other seborrheic keratosis: Secondary | ICD-10-CM | POA: Diagnosis not present

## 2022-10-19 ENCOUNTER — Other Ambulatory Visit: Payer: Self-pay | Admitting: Family Medicine

## 2022-10-19 NOTE — Telephone Encounter (Signed)
pharmacy: need updated directions. patient states she uses 2 sprays in each nostril now

## 2022-10-22 DIAGNOSIS — R29898 Other symptoms and signs involving the musculoskeletal system: Secondary | ICD-10-CM | POA: Insufficient documentation

## 2022-10-22 NOTE — Progress Notes (Unsigned)
Cardiology Office Note   Date:  10/22/2022   ID:  Rose Martinez, DOB 27-May-1935, MRN [id number]  PCP:  Dettinger, Fransisca Kaufmann, MD  Cardiologist:   None    No chief complaint on file.    History of Present Illness: Rose Martinez is a 87 y.o. female who presents for followup of leg weakness and palpitations.  ***   ***   She says she just "feeling myself."  She went over to see her primary care just to get a blood pressure yesterday but it was within normal limits as was her heart rate.  She is having lots of issues such as swaying when she walks with some staggering.  She feels like her head is full.  She feels like there is a strain between her eyes.  She feels better after lunch.  She wakes up in the morning with energy but then it goes down.  She feels like she is walking on cushions on the bottom of her feet.  Her legs are weak but not all of the time.  She has occasional palpitations that have not changed in frequency or intensity.  She had no presyncope or syncope.  He has had no chest pressure, neck or arm discomfort.  She had no weight gain or edema.  She has had no fevers or chills.   Past Medical History:  Diagnosis Date   Anxiety    Hyperlipidemia    Mild   Osteoporosis    Palpitations     Past Surgical History:  Procedure Laterality Date   CYSTECTOMY     Breast   EYE SURGERY     OOPHORECTOMY       Current Outpatient Medications  Medication Sig Dispense Refill   ALPRAZolam (XANAX) 0.25 MG tablet TAKE (1/2) TABLET UP TO THREE TIMES DAILY. (Patient taking differently: Take 0.25 mg by mouth as needed. TAKE (1/2) TABLET UP TO THREE TIMES DAILY.  Patient reports taking 1/2 tablet once per month.) 45 tablet 1   baclofen (LIORESAL) 10 MG tablet Take 1 tablet (10 mg total) by mouth at bedtime as needed for muscle spasms. 20 each 0   betamethasone dipropionate 0.05 % lotion Apply topically as needed.     betamethasone, augmented, (DIPROLENE) 0.05 % lotion Apply 1  application topically daily.     cholecalciferol (VITAMIN D) 1000 UNITS tablet Take 2,000 Units by mouth daily.      CIPRODEX OTIC suspension Place 4 drops into the left ear in the morning, at noon, in the evening, and at bedtime.     diclofenac (VOLTAREN) 75 MG EC tablet Take 1 tablet (75 mg total) by mouth 2 (two) times daily. For arthritis and pain 180 tablet 1   DULoxetine (CYMBALTA) 30 MG capsule Take 1 capsule (30 mg total) by mouth at bedtime. For depression 90 capsule 1   fluticasone (FLONASE) 50 MCG/ACT nasal spray 1 SPRAY IN EACH NOSTRIL ONCE DAILY. 16 g 11   Hydrocortisone Butyrate 0.1 % LOTN Apply 1 application topically as needed.     loratadine (CLARITIN) 10 MG tablet Take 10 mg by mouth daily.     mirabegron ER (MYRBETRIQ) 50 MG TB24 tablet Take 1 tablet (50 mg total) by mouth daily. 30 tablet 3   montelukast (SINGULAIR) 10 MG tablet TAKE (1) TABLET DAILY AS DIRECTED. 30 tablet 0   mupirocin ointment (BACTROBAN) 2 % Apply 1 application topically 2 (two) times daily. to affected area(s)     predniSONE (DELTASONE)  5 MG tablet Take 5 mg by mouth as directed.     Probiotic Product (ALIGN PO) Take 1 capsule by mouth daily.     propranolol (INDERAL) 20 MG tablet Take 1 tablet (20 mg total) by mouth daily as needed. 30 tablet 0   pseudoephedrine-guaifenesin (MUCINEX D) 60-600 MG 12 hr tablet Take 1 table 1-2 times daily 30 tablet 11   rosuvastatin (CRESTOR) 5 MG tablet Take 1 tablet (5 mg total) by mouth at bedtime. 90 tablet 3   sertraline (ZOLOFT) 25 MG tablet Take 1 tablet (25 mg total) by mouth daily. 90 tablet 1   Current Facility-Administered Medications  Medication Dose Route Frequency Provider Last Rate Last Admin   cyanocobalamin (VITAMIN B12) injection 1,000 mcg  1,000 mcg Intramuscular Q30 days Dettinger, Fransisca Kaufmann, MD   1,000 mcg at 10/12/22 1500    Allergies:   Statins, Cephalexin, and Penicillins   ROS:  Please see the history of present illness.   Otherwise, review of  systems are positive for ***.   All other systems are reviewed and negative.    PHYSICAL EXAM: VS:  There were no vitals taken for this visit. , BMI There is no height or weight on file to calculate BMI. GENERAL:  Well appearing NECK:  No jugular venous distention, waveform within normal limits, carotid upstroke brisk and symmetric, no bruits, no thyromegaly LUNGS:  Clear to auscultation bilaterally CHEST:  Unremarkable HEART:  PMI not displaced or sustained,S1 and S2 within normal limits, no S3, no S4, no clicks, no rubs, *** murmurs ABD:  Flat, positive bowel sounds normal in frequency in pitch, no bruits, no rebound, no guarding, no midline pulsatile mass, no hepatomegaly, no splenomegaly EXT:  2 plus pulses throughout, no edema, no cyanosis no clubbing    ***GENERAL:  Well appearing NECK:  No jugular venous distention, waveform within normal limits, carotid upstroke brisk and symmetric, no bruits, no thyromegaly LUNGS:  Clear to auscultation bilaterally CHEST:  Unremarkable HEART:  PMI not displaced or sustained,S1 and S2 within normal limits, no S3, no S4, no clicks, no rubs, no murmurs ABD:  Flat, positive bowel sounds normal in frequency in pitch, no bruits, no rebound, no guarding, no midline pulsatile mass, no hepatomegaly, no splenomegaly EXT:  2 plus pulses throughout, no edema, no cyanosis no clubbing, left leg hematoma   EKG:  EKG is *** ordered today. Normal sinus rhythm, rate ***, left axis deviation, premature atrial contractions, poor anterior R wave progression, low voltage in the limb and chest leads, no change from previous.   Recent Labs: 05/05/2022: ALT 11; BUN 15; Creatinine, Ser 0.62; Hemoglobin 13.4; Platelets 168; Potassium 4.4; Sodium 147; TSH 1.580    Lipid Panel    Component Value Date/Time   CHOL 159 02/01/2022 0807   TRIG 68 02/01/2022 0807   TRIG 90 03/30/2017 0811   HDL 57 02/01/2022 0807   HDL 71 03/30/2017 0811   CHOLHDL 2.8 02/01/2022 0807    LDLCALC 89 02/01/2022 0807   LDLCALC 126 (H) 05/06/2014 0803      Wt Readings from Last 3 Encounters:  07/25/22 126 lb 12.8 oz (57.5 kg)  07/20/22 127 lb (57.6 kg)  05/05/22 126 lb (57.2 kg)      Other studies Reviewed: Additional studies/ records that were reviewed today include: ***. Review of the above records demonstrates:  Please see elsewhere in the note.     ASSESSMENT AND PLAN:  PALPITATION:   *** The patient is having no  increased symptoms.  No change in therapy.  DYSLIPIDEMIA:   LDL was *** 89 with an HDL of 57.  No change in therapy  LEG WEAKNESS:  ***  I do not suspect cardiovascular etiology.  We talked about exercises and avoidance of falls.  Current medicines are reviewed at length with the patient today.  The patient does not have concerns regarding medicines.  The following changes have been made:  None  Labs/ tests ordered today include:   None  No orders of the defined types were placed in this encounter.   Disposition:   FU with me  in Nov at previously scheduled appointment.    Signed, Minus Breeding, MD  10/22/2022 9:45 AM    [place]

## 2022-10-25 ENCOUNTER — Encounter: Payer: Self-pay | Admitting: Cardiology

## 2022-10-25 ENCOUNTER — Ambulatory Visit (INDEPENDENT_AMBULATORY_CARE_PROVIDER_SITE_OTHER): Payer: Medicare Other | Admitting: Cardiology

## 2022-10-25 VITALS — BP 113/63 | HR 69 | Ht 65.0 in | Wt 125.8 lb

## 2022-10-25 DIAGNOSIS — E785 Hyperlipidemia, unspecified: Secondary | ICD-10-CM

## 2022-10-25 DIAGNOSIS — R002 Palpitations: Secondary | ICD-10-CM

## 2022-10-25 DIAGNOSIS — R29898 Other symptoms and signs involving the musculoskeletal system: Secondary | ICD-10-CM | POA: Diagnosis not present

## 2022-10-25 NOTE — Patient Instructions (Signed)
Medication Instructions:  The current medical regimen is effective;  continue present plan and medications.  *If you need a refill on your cardiac medications before your next appointment, please call your pharmacy*  Follow-Up: [facility], you and your health needs are our priority.  As part of our continuing mission to provide you with exceptional heart care, we have created designated Provider Care Teams.  These Care Teams include your primary Cardiologist (physician) and Advanced Practice Providers (APPs -  Physician Assistants and Nurse Practitioners) who all work together to provide you with the care you need, when you need it.  We recommend signing up for the patient portal called "MyChart".  Sign up information is provided on this After Visit Summary.  MyChart is used to connect with patients for Virtual Visits (Telemedicine).  Patients are able to view lab/test results, encounter notes, upcoming appointments, etc.  Non-urgent messages can be sent to your provider as well.   To learn more about what you can do with MyChart, go to [email or web address].    Your next appointment:   6 month(s)  The format for your next appointment:   In Person  Provider:   James Hochrein, MD     Important Information About Sugar       

## 2022-11-06 ENCOUNTER — Ambulatory Visit (INDEPENDENT_AMBULATORY_CARE_PROVIDER_SITE_OTHER): Payer: Medicare Other | Admitting: Family Medicine

## 2022-11-06 ENCOUNTER — Encounter: Payer: Self-pay | Admitting: Family Medicine

## 2022-11-06 VITALS — BP 121/58 | HR 58 | Temp 97.4°F | Ht 65.0 in | Wt 126.8 lb

## 2022-11-06 DIAGNOSIS — R52 Pain, unspecified: Secondary | ICD-10-CM | POA: Diagnosis not present

## 2022-11-06 DIAGNOSIS — F419 Anxiety disorder, unspecified: Secondary | ICD-10-CM

## 2022-11-06 DIAGNOSIS — N3281 Overactive bladder: Secondary | ICD-10-CM

## 2022-11-06 DIAGNOSIS — F4323 Adjustment disorder with mixed anxiety and depressed mood: Secondary | ICD-10-CM | POA: Diagnosis not present

## 2022-11-06 DIAGNOSIS — E782 Mixed hyperlipidemia: Secondary | ICD-10-CM | POA: Diagnosis not present

## 2022-11-06 MED ORDER — ROSUVASTATIN CALCIUM 5 MG PO TABS: 5.0000 mg | ORAL_TABLET | Freq: Every day | ORAL | 3 refills | Status: DC

## 2022-11-06 MED ORDER — SERTRALINE HCL 50 MG PO TABS: 25.0000 mg | ORAL_TABLET | Freq: Every day | ORAL | 3 refills | Status: DC

## 2022-11-06 MED ORDER — ALPRAZOLAM 0.25 MG PO TABS: 0.2500 mg | ORAL_TABLET | Freq: Every day | ORAL | 2 refills | Status: DC | PRN

## 2022-11-06 MED ORDER — PROPRANOLOL HCL 20 MG PO TABS: 20.0000 mg | ORAL_TABLET | Freq: Every day | ORAL | 3 refills | Status: DC | PRN

## 2022-11-06 MED ORDER — MIRABEGRON ER 50 MG PO TB24: 50.0000 mg | ORAL_TABLET | Freq: Every day | ORAL | 3 refills | Status: DC

## 2022-11-06 MED ORDER — SERTRALINE HCL 50 MG PO TABS: 50.0000 mg | ORAL_TABLET | Freq: Every day | ORAL | 3 refills | Status: DC

## 2022-11-06 MED ORDER — SERTRALINE HCL 25 MG PO TABS: 25.0000 mg | ORAL_TABLET | Freq: Every day | ORAL | 3 refills | Status: DC

## 2022-11-06 NOTE — Addendum Note (Signed)
Addended by: Caryl Pina on: 11/06/2022 10:21 AM   Modules accepted: Orders

## 2022-11-06 NOTE — Progress Notes (Signed)
BP (!) 121/58   Pulse (!) 58   Temp (!) 97.4 F (36.3 C) (Temporal)   Ht _0  (1.651 m)   Wt 126 lb 12.8 oz (57.5 kg)   SpO2 99%   BMI 21.10 kg/m    Subjective:   Patient ID: Rose Martinez, female    DOB: 10/31/1935, 87 y.o.   MRN: [id number]  HPI: Rose Martinez is a 87 y.o. female presenting on 11/06/2022 for Medical Management of Chronic Issues (6 month check up )   HPI Anxiety recheck Current rx-Xanax 0.25 mg daily as needed and also Zoloft # meds rx-30/month Effectiveness of current meds-works well, does not use on a regular basis Adverse reactions form meds-none  Pill count performed-No Last drug screen -08/20/2020 ( high risk q37m moderate risk q673mlow risk yearly ) Urine drug screen today- Yes Was the [place]yes  If yes were their any concerning findings? -None  No flowsheet data found.   Controlled substance contract signed on: Today  Hyperlipidemia Patient is coming in for recheck of his hyperlipidemia. The patient is currently taking Crestor. They deny any issues with myalgias or history of liver damage from it. They deny any focal numbness or weakness or chest pain.   Overactive bladder Patient is coming in for recheck of her overactive bladder and currently uses Myrbetriq.  She does feel like it is helping her and wants to continue forward on it.  Relevant past medical, surgical, family and social history reviewed and updated as indicated. Interim medical history since our last visit reviewed. Allergies and medications reviewed and updated.  Review of Systems  Constitutional:  Negative for chills and fever.  HENT:  Negative for congestion, ear discharge and ear pain.   Eyes:  Negative for visual disturbance.  Respiratory:  Negative for chest tightness and shortness of breath.   Cardiovascular:  Negative for chest pain and leg swelling.  Genitourinary:  Negative for difficulty urinating and dysuria.  Musculoskeletal:  Negative for back pain  and gait problem.  Skin:  Negative for rash.  Neurological:  Negative for dizziness, light-headedness and headaches.  Psychiatric/Behavioral:  Negative for agitation and behavioral problems.   All other systems reviewed and are negative.   Per HPI unless specifically indicated above   Allergies as of 11/06/2022       Reactions   Statins    myalgias   Cephalexin Other (See Comments)   Pt does not remember   Penicillins Other (See Comments)   Pt's mother was severly allergic, so she has never taken PCN.        Medication List        Accurate as of November 06, 2022  8:37 AM. If you have any questions, ask your nurse or doctor.          STOP taking these medications    baclofen 10 MG tablet Commonly known as: LIORESAL Stopped by: JoWorthy RancherMD   diclofenac 75 MG EC tablet Commonly known as: VOLTAREN Stopped by: JoFransisca Kaufmannettinger, Rose Martinez   DULoxetine 30 MG capsule Commonly known as: Cymbalta Stopped by: JoFransisca Kaufmannettinger, Rose Martinez       TAKE these medications    ALIGN PO Take 1 capsule by mouth daily.   ALPRAZolam 0.25 MG tablet Commonly known as: XANAX Take 1 tablet (0.25 mg total) by mouth daily as needed for anxiety.   betamethasone (augmented) 0.05 % lotion Commonly known as: DIPROLENE Apply 1 application topically daily.  betamethasone dipropionate 0.05 % lotion Apply topically as needed.   cholecalciferol 1000 units tablet Commonly known as: VITAMIN D Take 2,000 Units by mouth daily.   Ciprodex OTIC suspension Generic drug: ciprofloxacin-dexamethasone Place 4 drops into the left ear in the morning, at noon, in the evening, and at bedtime.   fluticasone 50 MCG/ACT nasal spray Commonly known as: FLONASE 1 SPRAY IN EACH NOSTRIL ONCE DAILY.   Hydrocortisone Butyrate 0.1 % Lotn Apply 1 application topically as needed.   loratadine 10 MG tablet Commonly known as: CLARITIN Take 10 mg by mouth daily.   mirabegron ER 50 MG Tb24  tablet Commonly known as: Myrbetriq Take 1 tablet (50 mg total) by mouth daily.   montelukast 10 MG tablet Commonly known as: SINGULAIR TAKE (1) TABLET DAILY AS DIRECTED.   mupirocin ointment 2 % Commonly known as: BACTROBAN Apply 1 application topically 2 (two) times daily. to affected area(s)   propranolol 20 MG tablet Commonly known as: INDERAL Take 1 tablet (20 mg total) by mouth daily as needed.   rosuvastatin 5 MG tablet Commonly known as: CRESTOR Take 1 tablet (5 mg total) by mouth at bedtime.   sertraline 50 MG tablet Commonly known as: ZOLOFT Take 0.5 tablets (25 mg total) by mouth daily. What changed: medication strength Changed by: Fransisca Kaufmann Rose Rossi, Rose Martinez         Objective:   BP (!) 121/58   Pulse (!) 58   Temp (!) 97.4 F (36.3 C) (Temporal)   Ht _0  (1.651 m)   Wt 126 lb 12.8 oz (57.5 kg)   SpO2 99%   BMI 21.10 kg/m   Wt Readings from Last 3 Encounters:  11/06/22 126 lb 12.8 oz (57.5 kg)  10/25/22 125 lb 12.8 oz (57.1 kg)  07/25/22 126 lb 12.8 oz (57.5 kg)    Physical Exam Vitals and nursing note reviewed.  Constitutional:      General: She is not in acute distress.    Appearance: She is well-developed. She is not diaphoretic.  Eyes:     Conjunctiva/sclera: Conjunctivae normal.  Cardiovascular:     Rate and Rhythm: Normal rate and regular rhythm.     Heart sounds: Normal heart sounds. No murmur heard. Pulmonary:     Effort: Pulmonary effort is normal. No respiratory distress.     Breath sounds: Normal breath sounds. No wheezing.  Musculoskeletal:        General: No swelling or tenderness. Normal range of motion.  Skin:    General: Skin is warm and dry.     Findings: No rash.  Neurological:     Mental Status: She is alert and oriented to person, place, and time.     Coordination: Coordination normal.  Psychiatric:        Mood and Affect: Mood is anxious. Mood is not depressed.        Behavior: Behavior normal.        Thought Content:  Thought content does not include suicidal ideation. Thought content does not include suicidal plan.     Assessment & Plan:   Problem List Items Addressed This Visit       Genitourinary   OAB (overactive bladder)   Relevant Medications   mirabegron ER (MYRBETRIQ) 50 MG TB24 tablet   Other Relevant Orders   CBC with Differential/Platelet     Other   Adjustment disorder with mixed anxiety and depressed mood   Relevant Medications   ALPRAZolam (XANAX) 0.25 MG tablet   sertraline (  ZOLOFT) 50 MG tablet   Hyperlipidemia - Primary   Relevant Medications   propranolol (INDERAL) 20 MG tablet   rosuvastatin (CRESTOR) 5 MG tablet   Other Relevant Orders   CMP14+EGFR   Lipid panel   Other Visit Diagnoses     Anxiety       Relevant Medications   ALPRAZolam (XANAX) 0.25 MG tablet   sertraline (ZOLOFT) 50 MG tablet   Other Relevant Orders   CBC with Differential/Platelet       Continue current medicine for cholesterol pill we will check blood work.  We will refill her Xanax and recommended to use only as needed.  We will increase his Zoloft to 50 mg to help more with anxiety. Follow up plan: Return in about 6 months (around 05/07/2023), or if symptoms worsen or fail to improve, for Overactive bladder hyperlipidemia recheck.  Counseling provided for all of the vaccine components Orders Placed This Encounter  Procedures   CBC with Differential/Platelet   CMP14+EGFR   Lipid panel    Caryl Pina, Rose Martinez [facility] 11/06/2022, 8:37 AM

## 2022-11-13 LAB — DRUG SCREEN 10 W/CONF, SERUM
Amphetamines, IA: NEGATIVE ng/mL
Barbiturates, IA: NEGATIVE ug/mL
Benzodiazepines, IA: NEGATIVE ng/mL
Cocaine & Metabolite, IA: NEGATIVE ng/mL
Methadone, IA: NEGATIVE ng/mL
Opiates, IA: NEGATIVE ng/mL
Oxycodones, IA: NEGATIVE ng/mL
Phencyclidine, IA: NEGATIVE ng/mL
Propoxyphene, IA: NEGATIVE ng/mL
THC(Marijuana) Metabolite, IA: NEGATIVE ng/mL

## 2022-11-13 LAB — CBC WITH DIFFERENTIAL/PLATELET
Basophils Absolute: 0 10*3/uL (ref 0.0–0.2)
Basos: 1 %
EOS (ABSOLUTE): 0.1 10*3/uL (ref 0.0–0.4)
Eos: 1 %
Hematocrit: 40.8 % (ref 34.0–46.6)
Hemoglobin: 13.4 g/dL (ref 11.1–15.9)
Immature Grans (Abs): 0 10*3/uL (ref 0.0–0.1)
Immature Granulocytes: 0 %
Lymphocytes Absolute: 1.2 10*3/uL (ref 0.7–3.1)
Lymphs: 23 %
MCH: 32.8 pg (ref 26.6–33.0)
MCHC: 32.8 g/dL (ref 31.5–35.7)
MCV: 100 fL — ABNORMAL HIGH (ref 79–97)
Monocytes Absolute: 0.4 10*3/uL (ref 0.1–0.9)
Monocytes: 7 %
Neutrophils Absolute: 3.5 10*3/uL (ref 1.4–7.0)
Neutrophils: 68 %
Platelets: 163 10*3/uL (ref 150–450)
RBC: 4.09 x10E6/uL (ref 3.77–5.28)
RDW: 11.3 % — ABNORMAL LOW (ref 11.7–15.4)
WBC: 5.2 10*3/uL (ref 3.4–10.8)

## 2022-11-13 LAB — CMP14+EGFR
ALT: 11 IU/L (ref 0–32)
AST: 16 IU/L (ref 0–40)
Albumin/Globulin Ratio: 2.3 — ABNORMAL HIGH (ref 1.2–2.2)
Albumin: 4.6 g/dL (ref 3.7–4.7)
Alkaline Phosphatase: 87 IU/L (ref 44–121)
BUN/Creatinine Ratio: 24 (ref 12–28)
BUN: 15 mg/dL (ref 8–27)
Bilirubin Total: 0.4 mg/dL (ref 0.0–1.2)
CO2: 24 mmol/L (ref 20–29)
Calcium: 9.3 mg/dL (ref 8.7–10.3)
Chloride: 104 mmol/L (ref 96–106)
Creatinine, Ser: 0.62 mg/dL (ref 0.57–1.00)
Globulin, Total: 2 g/dL (ref 1.5–4.5)
Glucose: 91 mg/dL (ref 70–99)
Potassium: 4.4 mmol/L (ref 3.5–5.2)
Sodium: 143 mmol/L (ref 134–144)
Total Protein: 6.6 g/dL (ref 6.0–8.5)
eGFR: 87 mL/min/{1.73_m2} (ref >59)

## 2022-11-13 LAB — LIPID PANEL
Chol/HDL Ratio: 2.5 ratio (ref 0.0–4.4)
Cholesterol, Total: 166 mg/dL (ref 100–199)
HDL: 66 mg/dL (ref >39)
LDL Chol Calc (NIH): 83 mg/dL (ref 0–99)
Triglycerides: 92 mg/dL (ref 0–149)
VLDL Cholesterol Cal: 17 mg/dL (ref 5–40)

## 2022-11-14 ENCOUNTER — Ambulatory Visit (INDEPENDENT_AMBULATORY_CARE_PROVIDER_SITE_OTHER): Payer: Medicare Other | Admitting: *Deleted

## 2022-11-14 DIAGNOSIS — E538 Deficiency of other specified B group vitamins: Secondary | ICD-10-CM | POA: Diagnosis not present

## 2022-11-30 DIAGNOSIS — Z961 Presence of intraocular lens: Secondary | ICD-10-CM | POA: Diagnosis not present

## 2022-12-07 DIAGNOSIS — Z889 Allergy status to unspecified drugs, medicaments and biological substances status: Secondary | ICD-10-CM | POA: Diagnosis not present

## 2022-12-07 DIAGNOSIS — H6123 Impacted cerumen, bilateral: Secondary | ICD-10-CM | POA: Diagnosis not present

## 2022-12-07 DIAGNOSIS — Z79899 Other long term (current) drug therapy: Secondary | ICD-10-CM | POA: Diagnosis not present

## 2022-12-07 DIAGNOSIS — Z881 Allergy status to other antibiotic agents status: Secondary | ICD-10-CM | POA: Diagnosis not present

## 2022-12-07 DIAGNOSIS — Z880 Allergy status to penicillin: Secondary | ICD-10-CM | POA: Diagnosis not present

## 2022-12-07 DIAGNOSIS — R2689 Other abnormalities of gait and mobility: Secondary | ICD-10-CM | POA: Diagnosis not present

## 2022-12-07 DIAGNOSIS — H608X3 Other otitis externa, bilateral: Secondary | ICD-10-CM | POA: Diagnosis not present

## 2022-12-07 DIAGNOSIS — H8110 Benign paroxysmal vertigo, unspecified ear: Secondary | ICD-10-CM | POA: Diagnosis not present

## 2022-12-14 ENCOUNTER — Ambulatory Visit (INDEPENDENT_AMBULATORY_CARE_PROVIDER_SITE_OTHER): Payer: Medicare Other | Admitting: *Deleted

## 2022-12-14 DIAGNOSIS — E538 Deficiency of other specified B group vitamins: Secondary | ICD-10-CM | POA: Diagnosis not present

## 2022-12-20 ENCOUNTER — Telehealth: Payer: Medicare Other | Admitting: Family Medicine

## 2023-01-16 ENCOUNTER — Ambulatory Visit: Payer: Medicare Other

## 2023-01-18 ENCOUNTER — Ambulatory Visit (INDEPENDENT_AMBULATORY_CARE_PROVIDER_SITE_OTHER): Payer: Medicare Other | Admitting: *Deleted

## 2023-01-18 DIAGNOSIS — E538 Deficiency of other specified B group vitamins: Secondary | ICD-10-CM | POA: Diagnosis not present

## 2023-01-18 NOTE — Progress Notes (Signed)
B12 injection given left deltoid, intramuscular.  Patient tolerated well

## 2023-02-12 ENCOUNTER — Telehealth: Payer: Self-pay | Admitting: Family Medicine

## 2023-02-12 NOTE — Telephone Encounter (Signed)
Informed pt that she will need to have an appt to discuss her concerns with Dr. Warrick Parisian.  Pt is scheduled for a telephone visit 2/28 at 8:55am.

## 2023-02-12 NOTE — Telephone Encounter (Signed)
Pt r/c wants to speak to [facility]

## 2023-02-12 NOTE — Telephone Encounter (Signed)
Pt has two very important things to talk about to Dr. Keturah Barre. About a health situation and one is not about health Her health situation is urgent does not want to speak to a nurse

## 2023-02-14 ENCOUNTER — Telehealth (INDEPENDENT_AMBULATORY_CARE_PROVIDER_SITE_OTHER): Payer: Medicare Other | Admitting: Family Medicine

## 2023-02-14 ENCOUNTER — Encounter: Payer: Self-pay | Admitting: Family Medicine

## 2023-02-14 DIAGNOSIS — R319 Hematuria, unspecified: Secondary | ICD-10-CM

## 2023-02-14 DIAGNOSIS — N939 Abnormal uterine and vaginal bleeding, unspecified: Secondary | ICD-10-CM | POA: Diagnosis not present

## 2023-02-14 NOTE — Progress Notes (Signed)
Virtual Visit via telephone Note  I connected with Rose Martinez on 02/14/23 at 1141 by telephone and verified that I am speaking with the correct person using two identifiers. Rose Martinez is currently located at home and patient are currently with her during visit. The provider, Fransisca Kaufmann Averleigh Savary, MD is located in their office at time of visit.  Call ended at 1156  I discussed the limitations, risks, security and privacy concerns of performing an evaluation and management service by telephone and the availability of in person appointments. I also discussed with the patient that there may be a patient responsible charge related to this service. The patient expressed understanding and agreed to proceed.   History and Present Illness: Patient is calling in for 2 days of blood in pajamas when she awoke.  She had blood on wiping as well. She has not not had a gynecological in sometime.  She has not sexual intercourse in 9 years.  She has not had blood since 2 days ago.  She does have urinary frequency but that's normal for her.  She denies vaginal discharge or irritation. She denies vaginal rash. She thinks blood came from vaginal region.   1. Vaginal spotting   2. Hematuria, unspecified type     Outpatient Encounter Medications as of 02/14/2023  Medication Sig   ALPRAZolam (XANAX) 0.25 MG tablet Take 1 tablet (0.25 mg total) by mouth daily as needed for anxiety.   betamethasone dipropionate 0.05 % lotion Apply topically as needed.   betamethasone, augmented, (DIPROLENE) 0.05 % lotion Apply 1 application topically daily.   cholecalciferol (VITAMIN D) 1000 UNITS tablet Take 2,000 Units by mouth daily.    CIPRODEX OTIC suspension Place 4 drops into the left ear in the morning, at noon, in the evening, and at bedtime.   fluticasone (FLONASE) 50 MCG/ACT nasal spray 1 SPRAY IN EACH NOSTRIL ONCE DAILY.   Hydrocortisone Butyrate 0.1 % LOTN Apply 1 application topically as needed.   loratadine  (CLARITIN) 10 MG tablet Take 10 mg by mouth daily.   montelukast (SINGULAIR) 10 MG tablet TAKE (1) TABLET DAILY AS DIRECTED.   mupirocin ointment (BACTROBAN) 2 % Apply 1 application topically 2 (two) times daily. to affected area(s)   Probiotic Product (ALIGN PO) Take 1 capsule by mouth daily.   propranolol (INDERAL) 20 MG tablet Take 1 tablet (20 mg total) by mouth daily as needed.   rosuvastatin (CRESTOR) 5 MG tablet Take 1 tablet (5 mg total) by mouth at bedtime.   sertraline (ZOLOFT) 50 MG tablet Take 1 tablet (50 mg total) by mouth daily.   [DISCONTINUED] mirabegron ER (MYRBETRIQ) 50 MG TB24 tablet Take 1 tablet (50 mg total) by mouth daily.   Facility-Administered Encounter Medications as of 02/14/2023  Medication   cyanocobalamin (VITAMIN B12) injection 1,000 mcg    Review of Systems  Constitutional:  Negative for chills and fever.  Eyes:  Negative for visual disturbance.  Respiratory:  Negative for chest tightness and shortness of breath.   Cardiovascular:  Negative for chest pain and leg swelling.  Gastrointestinal:  Negative for abdominal pain.  Genitourinary:  Positive for vaginal bleeding. Negative for difficulty urinating, dysuria, frequency, hematuria, vaginal discharge and vaginal pain.  Musculoskeletal:  Negative for back pain and gait problem.  Skin:  Negative for rash.  Neurological:  Negative for light-headedness and headaches.  Psychiatric/Behavioral:  Negative for agitation and behavioral problems.   All other systems reviewed and are negative.   Observations/Objective: Patient sounds  comfortable and in no acute distress  Assessment and Plan: Problem List Items Addressed This Visit   None Visit Diagnoses     Vaginal spotting    -  Primary   Relevant Orders   Urinalysis, Complete   Urine Culture   Hematuria, unspecified type       Relevant Orders   Urinalysis, Complete   Urine Culture       Patient will come and leave urine but it does sound like it  is possible vaginal bleeding, we will get her scheduled ASAP for an in person visit so we can do a pelvic exam. Follow up plan: Return if symptoms worsen or fail to improve.     I discussed the assessment and treatment plan with the patient. The patient was provided an opportunity to ask questions and all were answered. The patient agreed with the plan and demonstrated an understanding of the instructions.   The patient was advised to call back or seek an in-person evaluation if the symptoms worsen or if the condition fails to improve as anticipated.  The above assessment and management plan was discussed with the patient. The patient verbalized understanding of and has agreed to the management plan. Patient is aware to call the clinic if symptoms persist or worsen. Patient is aware when to return to the clinic for a follow-up visit. Patient educated on when it is appropriate to go to the emergency department.    I provided 15 minutes of non-face-to-face time during this encounter.    Worthy Rancher, MD

## 2023-02-15 ENCOUNTER — Ambulatory Visit (INDEPENDENT_AMBULATORY_CARE_PROVIDER_SITE_OTHER): Payer: Medicare Other | Admitting: Family Medicine

## 2023-02-15 ENCOUNTER — Encounter: Payer: Self-pay | Admitting: Family Medicine

## 2023-02-15 ENCOUNTER — Other Ambulatory Visit (HOSPITAL_COMMUNITY)
Admission: RE | Admit: 2023-02-15 | Discharge: 2023-02-15 | Disposition: A | Discharge status: Home / Self Care | Payer: Medicare Other | Source: Ambulatory Visit | Attending: Family Medicine | Admitting: Family Medicine

## 2023-02-15 VITALS — BP 119/71 | HR 57 | Ht 65.0 in | Wt 129.0 lb

## 2023-02-15 DIAGNOSIS — N950 Postmenopausal bleeding: Secondary | ICD-10-CM | POA: Diagnosis not present

## 2023-02-15 DIAGNOSIS — N939 Abnormal uterine and vaginal bleeding, unspecified: Secondary | ICD-10-CM | POA: Insufficient documentation

## 2023-02-15 DIAGNOSIS — R319 Hematuria, unspecified: Secondary | ICD-10-CM | POA: Diagnosis not present

## 2023-02-15 DIAGNOSIS — R102 Pelvic and perineal pain: Secondary | ICD-10-CM | POA: Diagnosis not present

## 2023-02-15 LAB — URINALYSIS
Bilirubin, UA: NEGATIVE
Glucose, UA: NEGATIVE
Ketones, UA: NEGATIVE
Leukocytes,UA: NEGATIVE
Nitrite, UA: NEGATIVE
Protein,UA: NEGATIVE
RBC, UA: NEGATIVE
Specific Gravity, UA: 1.015 (ref 1.005–1.030)
Urobilinogen, Ur: 0.2 mg/dL (ref 0.2–1.0)
pH, UA: 7 (ref 5.0–7.5)

## 2023-02-15 NOTE — Progress Notes (Signed)
BP 119/71   Pulse (!) 57   Ht '5\' 5"'$  (1.651 m)   Wt 129 lb (58.5 kg)   SpO2 96%   BMI 21.47 kg/m    Subjective:   Patient ID: Rose Martinez, female    DOB: Oct 10, 1935, 88 y.o.   MRN: [id number]  HPI: Rose Martinez is a 88 y.o. female presenting on 02/15/2023 for Pelvic Pain   HPI Vaginal spotting Patient is coming in today for vaginal spotting.  She had a virtual visit yesterday that talked about the issue.  She had an episode 3 days ago where she had some spotting twice and has not had it since.  She denies any vaginal pain or discharge.  She denies any dysuria or urinary frequency.  She denies any abdominal pain or fevers or chills.  She is 88 years old so she is definitely postmenopausal and has not had this issue before.  She lost her partner 9 years ago and has not had any sexual partner since that time.  She also had some blood on the paper when she wiped that same day as well.  She thinks the bleeding is more vaginal than urinary in origin.  Relevant past medical, surgical, family and social history reviewed and updated as indicated. Interim medical history since our last visit reviewed. Allergies and medications reviewed and updated.  Review of Systems  Constitutional:  Negative for chills and fever.  HENT:  Negative for congestion, ear discharge and ear pain.   Eyes:  Negative for visual disturbance.  Respiratory:  Negative for chest tightness and shortness of breath.   Cardiovascular:  Negative for chest pain and leg swelling.  Gastrointestinal:  Negative for abdominal pain and anal bleeding.  Genitourinary:  Positive for vaginal bleeding. Negative for difficulty urinating, dysuria, flank pain, frequency, pelvic pain, vaginal discharge and vaginal pain.  Musculoskeletal:  Negative for back pain and gait problem.  Skin:  Negative for rash.  Neurological:  Negative for light-headedness and headaches.  Psychiatric/Behavioral:  Negative for agitation and behavioral  problems.   All other systems reviewed and are negative.   Per HPI unless specifically indicated above   Allergies as of 02/15/2023       Reactions   Statins    myalgias   Cephalexin Other (See Comments)   Pt does not remember   Penicillins Other (See Comments)   Pt's mother was severly allergic, so she has never taken PCN.        Medication List        Accurate as of February 15, 2023 10:17 AM. If you have any questions, ask your nurse or doctor.          ALIGN PO Take 1 capsule by mouth daily.   ALPRAZolam 0.25 MG tablet Commonly known as: XANAX Take 1 tablet (0.25 mg total) by mouth daily as needed for anxiety.   betamethasone (augmented) 0.05 % lotion Commonly known as: DIPROLENE Apply 1 application topically daily.   betamethasone dipropionate 0.05 % lotion Apply topically as needed.   cholecalciferol 1000 units tablet Commonly known as: VITAMIN D Take 2,000 Units by mouth daily.   Ciprodex OTIC suspension Generic drug: ciprofloxacin-dexamethasone Place 4 drops into the left ear in the morning, at noon, in the evening, and at bedtime.   fluticasone 50 MCG/ACT nasal spray Commonly known as: FLONASE 1 SPRAY IN EACH NOSTRIL ONCE DAILY.   Hydrocortisone Butyrate 0.1 % Lotn Apply 1 application topically as needed.   loratadine 10  MG tablet Commonly known as: CLARITIN Take 10 mg by mouth daily.   montelukast 10 MG tablet Commonly known as: SINGULAIR TAKE (1) TABLET DAILY AS DIRECTED.   mupirocin ointment 2 % Commonly known as: BACTROBAN Apply 1 application topically 2 (two) times daily. to affected area(s)   propranolol 20 MG tablet Commonly known as: INDERAL Take 1 tablet (20 mg total) by mouth daily as needed.   rosuvastatin 5 MG tablet Commonly known as: CRESTOR Take 1 tablet (5 mg total) by mouth at bedtime.   sertraline 50 MG tablet Commonly known as: ZOLOFT Take 1 tablet (50 mg total) by mouth daily.         Objective:   BP  119/71   Pulse (!) 57   Ht '5\' 5"'$  (1.651 m)   Wt 129 lb (58.5 kg)   SpO2 96%   BMI 21.47 kg/m   Wt Readings from Last 3 Encounters:  02/15/23 129 lb (58.5 kg)  11/06/22 126 lb 12.8 oz (57.5 kg)  10/25/22 125 lb 12.8 oz (57.1 kg)    Physical Exam Vitals and nursing note reviewed.  Constitutional:      General: She is not in acute distress.    Appearance: She is well-developed. She is not diaphoretic.  Eyes:     Conjunctiva/sclera: Conjunctivae normal.  Cardiovascular:     Rate and Rhythm: Normal rate and regular rhythm.     Heart sounds: Normal heart sounds. No murmur heard. Pulmonary:     Effort: Pulmonary effort is normal. No respiratory distress.     Breath sounds: Normal breath sounds. No wheezing.  Musculoskeletal:        General: No tenderness. Normal range of motion.  Skin:    General: Skin is warm and dry.     Findings: No rash.  Neurological:     Mental Status: She is alert and oriented to person, place, and time.     Coordination: Coordination normal.  Psychiatric:        Behavior: Behavior normal.     Urinalysis: Normal urinalysis  Assessment & Plan:   Problem List Items Addressed This Visit   None Visit Diagnoses     Postmenopausal bleeding    -  Primary   Relevant Orders   US Pelvic Complete With Transvaginal   Vaginal spotting       Relevant Orders   Urine Culture   Urinalysis   US Pelvic Complete With Transvaginal   Hematuria, unspecified type       Relevant Orders   Urine Culture   Urinalysis       With postmenopausal possible spotting or bleeding, will order ultrasound to look at endometrial lining.  She does have vaginal dryness and atrophy, that is likely the source of her spotting and bleeding.  If ultrasound looks good then we may consider vaginal estrogen cream Follow up plan: Return if symptoms worsen or fail to improve.  Counseling provided for all of the vaccine components Orders Placed This Encounter  Procedures   Urine  Culture   US Pelvic Complete With Transvaginal   Urinalysis    Caryl Pina, MD [facility] 02/15/2023, 10:17 AM

## 2023-02-15 NOTE — Addendum Note (Signed)
Addended by: Caryl Pina on: 02/15/2023 10:25 AM   Modules accepted: Orders

## 2023-02-16 LAB — CERVICOVAGINAL ANCILLARY ONLY
Bacterial Vaginitis (gardnerella): NEGATIVE
Candida Glabrata: NEGATIVE
Candida Vaginitis: NEGATIVE
Comment: NEGATIVE
Comment: NEGATIVE
Comment: NEGATIVE

## 2023-02-17 LAB — URINE CULTURE

## 2023-02-19 ENCOUNTER — Ambulatory Visit (INDEPENDENT_AMBULATORY_CARE_PROVIDER_SITE_OTHER): Payer: Medicare Other

## 2023-02-19 DIAGNOSIS — E538 Deficiency of other specified B group vitamins: Secondary | ICD-10-CM

## 2023-02-19 NOTE — Progress Notes (Signed)
Cyanocobalamin injection given to right deltoid.  Patient tolerated well. 

## 2023-03-22 ENCOUNTER — Ambulatory Visit (INDEPENDENT_AMBULATORY_CARE_PROVIDER_SITE_OTHER): Payer: Medicare Other

## 2023-03-22 DIAGNOSIS — E538 Deficiency of other specified B group vitamins: Secondary | ICD-10-CM

## 2023-03-22 NOTE — Progress Notes (Signed)
Cyanocobalamin injection given to left deltoid.  Patient tolerated well. 

## 2023-03-28 ENCOUNTER — Other Ambulatory Visit (HOSPITAL_BASED_OUTPATIENT_CLINIC_OR_DEPARTMENT_OTHER): Payer: BC Managed Care – PPO

## 2023-04-17 ENCOUNTER — Ambulatory Visit (INDEPENDENT_AMBULATORY_CARE_PROVIDER_SITE_OTHER): Payer: Medicare Other | Admitting: *Deleted

## 2023-04-17 DIAGNOSIS — E538 Deficiency of other specified B group vitamins: Secondary | ICD-10-CM

## 2023-04-17 NOTE — Progress Notes (Signed)
Vitamin b12 injection given and patient tolerated well.  

## 2023-04-18 DIAGNOSIS — D1801 Hemangioma of skin and subcutaneous tissue: Secondary | ICD-10-CM | POA: Diagnosis not present

## 2023-04-18 DIAGNOSIS — D225 Melanocytic nevi of trunk: Secondary | ICD-10-CM | POA: Diagnosis not present

## 2023-04-18 DIAGNOSIS — L812 Freckles: Secondary | ICD-10-CM | POA: Diagnosis not present

## 2023-04-18 DIAGNOSIS — L821 Other seborrheic keratosis: Secondary | ICD-10-CM | POA: Diagnosis not present

## 2023-04-18 DIAGNOSIS — D692 Other nonthrombocytopenic purpura: Secondary | ICD-10-CM | POA: Diagnosis not present

## 2023-04-18 DIAGNOSIS — L738 Other specified follicular disorders: Secondary | ICD-10-CM | POA: Diagnosis not present

## 2023-04-18 DIAGNOSIS — L218 Other seborrheic dermatitis: Secondary | ICD-10-CM | POA: Diagnosis not present

## 2023-04-22 NOTE — Progress Notes (Unsigned)
  Cardiology Office Note:   Date:  04/25/2023  ID:  Rose Martinez, DOB 04-Nov-1935, MRN [id number]  History of Present Illness:   Rose Martinez is a 88 y.o. female who presents for followup of palpitations.   She does not report any palpitations.   She has been feeling OK.  The patient denies any new symptoms such as chest discomfort, neck or arm discomfort. There has been no new shortness of breath, PND or orthopnea. There have been no reported palpitations, presyncope or syncope.   She has been having nightmares and wonders if this could be the Singulair.    ROS: As stated in the HPI and negative for all other systems.  Studies Reviewed:    EKG:  NA    Risk Assessment/Calculations:             Physical Exam:   VS:  BP 108/62   Pulse 60   Ht 5\' 5"  (1.651 m)   Wt 130 lb (59 kg)   BMI 21.63 kg/m    Wt Readings from Last 3 Encounters:  04/25/23 130 lb (59 kg)  02/15/23 129 lb (58.5 kg)  11/06/22 126 lb 12.8 oz (57.5 kg)     GEN: Well nourished, well developed in no acute distress NECK: No JVD; No carotid bruits CARDIAC: RRR, no murmurs, rubs, gallops RESPIRATORY:  Clear to auscultation without rales, wheezing or rhonchi  ABDOMEN: Soft, non-tender, non-distended EXTREMITIES:  No edema; No deformity   ASSESSMENT AND PLAN:   Palpitations:     She is not particularly bothered by these.  She rarely uses propranolol.  No change in therapy.  No further work up.  Nightmares:  This could be caused by Singulair.  I have suggested moving this to morning to see if this helps.            Signed, Rollene Rotunda, MD

## 2023-04-25 ENCOUNTER — Ambulatory Visit (INDEPENDENT_AMBULATORY_CARE_PROVIDER_SITE_OTHER): Payer: Medicare Other | Admitting: Cardiology

## 2023-04-25 ENCOUNTER — Encounter: Payer: Self-pay | Admitting: Cardiology

## 2023-04-25 VITALS — BP 108/62 | HR 60 | Ht 65.0 in | Wt 130.0 lb

## 2023-04-25 DIAGNOSIS — M79606 Pain in leg, unspecified: Secondary | ICD-10-CM

## 2023-04-25 DIAGNOSIS — R002 Palpitations: Secondary | ICD-10-CM

## 2023-04-25 NOTE — Patient Instructions (Signed)
Medication Instructions:  The current medical regimen is effective;  continue present plan and medications.  *If you need a refill on your cardiac medications before your next appointment, please call your pharmacy*  Follow-Up: At [facility], you and your health needs are our priority.  As part of our continuing mission to provide you with exceptional heart care, we have created designated Provider Care Teams.  These Care Teams include your primary Cardiologist (physician) and Advanced Practice Providers (APPs -  Physician Assistants and Nurse Practitioners) who all work together to provide you with the care you need, when you need it.  We recommend signing up for the patient portal called "MyChart".  Sign up information is provided on this After Visit Summary.  MyChart is used to connect with patients for Virtual Visits (Telemedicine).  Patients are able to view lab/test results, encounter notes, upcoming appointments, etc.  Non-urgent messages can be sent to your provider as well.   To learn more about what you can do with MyChart, go to [email or web address].    Your next appointment:   6 month(s)  Provider:   James Hochrein, MD    

## 2023-05-07 ENCOUNTER — Encounter: Payer: Self-pay | Admitting: Family Medicine

## 2023-05-07 ENCOUNTER — Ambulatory Visit (INDEPENDENT_AMBULATORY_CARE_PROVIDER_SITE_OTHER): Payer: Medicare Other | Admitting: Family Medicine

## 2023-05-07 VITALS — BP 124/56 | HR 58 | Temp 97.5°F | Ht 65.0 in | Wt 129.0 lb

## 2023-05-07 DIAGNOSIS — F419 Anxiety disorder, unspecified: Secondary | ICD-10-CM

## 2023-05-07 DIAGNOSIS — Z79899 Other long term (current) drug therapy: Secondary | ICD-10-CM | POA: Diagnosis not present

## 2023-05-07 DIAGNOSIS — M810 Age-related osteoporosis without current pathological fracture: Secondary | ICD-10-CM | POA: Diagnosis not present

## 2023-05-07 DIAGNOSIS — E538 Deficiency of other specified B group vitamins: Secondary | ICD-10-CM | POA: Diagnosis not present

## 2023-05-07 DIAGNOSIS — F4323 Adjustment disorder with mixed anxiety and depressed mood: Secondary | ICD-10-CM

## 2023-05-07 DIAGNOSIS — E782 Mixed hyperlipidemia: Secondary | ICD-10-CM | POA: Diagnosis not present

## 2023-05-07 MED ORDER — ALPRAZOLAM 0.25 MG PO TABS: 0.2500 mg | ORAL_TABLET | Freq: Every day | ORAL | 2 refills | Status: DC | PRN

## 2023-05-07 MED ORDER — CYANOCOBALAMIN 1000 MCG/ML IJ SOLN
1000.0000 ug | Freq: Once | INTRAMUSCULAR | Status: AC
Start: 2023-05-07 — End: 2023-05-07
  Administered 2023-05-07: 1000 ug via INTRAMUSCULAR

## 2023-05-07 NOTE — Progress Notes (Signed)
BP (!) 124/56   Pulse (!) 58   Temp (!) 97.5 F (36.4 C)   Ht 5\' 5"  (1.651 m)   Wt 129 lb (58.5 kg)   SpO2 99%   BMI 21.47 kg/m    Subjective:   Patient ID: Rose Martinez, female    DOB: 03-Jan-1935, 88 y.o.   MRN: [id number]  HPI: Rose Martinez is a 88 y.o. female presenting on 05/07/2023 for Medical Management of Chronic Issues   HPI Hyperlipidemia Patient is coming in for recheck of his hyperlipidemia. The patient is currently taking Crestor. They deny any issues with myalgias or history of liver damage from it. They deny any focal numbness or weakness or chest pain.   B12 deficiency recheck Patient has vitamin B12 deficiency and is coming in for recheck and injection today.  She is a week early we will go ahead and do the injection and get her back on schedule.  Anxiety recheck Current rx-0.25 nightly as needed, she also takes Zoloft that does seem to be helping. # meds rx-30/month Effectiveness of current meds-works well, she does not take it on a regular basis Adverse reactions form meds-none  Pill count performed-No Last drug screen -11/17/2022 ( high risk q41m, moderate risk q24m, low risk yearly ) Urine drug screen today- No Was the [place] reviewed-yes  If yes were their any concerning findings? -None  No flowsheet data found.   Controlled substance contract signed on: 11/17/2022  Relevant past medical, surgical, family and social history reviewed and updated as indicated. Interim medical history since our last visit reviewed. Allergies and medications reviewed and updated.  Review of Systems  Constitutional:  Negative for chills and fever.  Eyes:  Negative for visual disturbance.  Respiratory:  Negative for chest tightness and shortness of breath.   Cardiovascular:  Negative for chest pain and leg swelling.  Genitourinary:  Positive for frequency. Negative for difficulty urinating and dysuria.  Musculoskeletal:  Negative for back pain and gait problem.   Skin:  Negative for rash.  Neurological:  Negative for dizziness, light-headedness and headaches.  Psychiatric/Behavioral:  Negative for agitation, behavioral problems, dysphoric mood, self-injury, sleep disturbance and suicidal ideas. The patient is nervous/anxious.   All other systems reviewed and are negative.   Per HPI unless specifically indicated above   Allergies as of 05/07/2023       Reactions   Statins    myalgias   Cephalexin Other (See Comments)   Pt does not remember   Penicillins Other (See Comments)   Pt's mother was severly allergic, so she has never taken PCN.        Medication List        Accurate as of May 07, 2023  9:52 AM. If you have any questions, ask your nurse or doctor.          ALIGN PO Take 1 capsule by mouth daily.   ALPRAZolam 0.25 MG tablet Commonly known as: XANAX Take 1 tablet (0.25 mg total) by mouth daily as needed for anxiety.   betamethasone (augmented) 0.05 % lotion Commonly known as: DIPROLENE Apply 1 application topically daily.   betamethasone dipropionate 0.05 % lotion Apply topically as needed.   cholecalciferol 1000 units tablet Commonly known as: VITAMIN D Take 2,000 Units by mouth daily.   Ciprodex OTIC suspension Generic drug: ciprofloxacin-dexamethasone Place 4 drops into the left ear in the morning, at noon, in the evening, and at bedtime.   fluticasone 50 MCG/ACT nasal spray Commonly  known as: FLONASE 1 SPRAY IN EACH NOSTRIL ONCE DAILY.   guaiFENesin 600 MG 12 hr tablet Commonly known as: MUCINEX Take 600 mg by mouth daily.   Hydrocortisone Butyrate 0.1 % Lotn Apply 1 application topically as needed.   loratadine 10 MG tablet Commonly known as: CLARITIN Take 10 mg by mouth daily as needed.   montelukast 10 MG tablet Commonly known as: SINGULAIR TAKE (1) TABLET DAILY AS DIRECTED.   mupirocin ointment 2 % Commonly known as: BACTROBAN Apply 1 application topically 2 (two) times daily. to  affected area(s)   Myrbetriq 50 MG Tb24 tablet Generic drug: mirabegron ER Take 50 mg by mouth daily as needed.   propranolol 20 MG tablet Commonly known as: INDERAL Take 1 tablet (20 mg total) by mouth daily as needed.   rosuvastatin 5 MG tablet Commonly known as: CRESTOR Take 1 tablet (5 mg total) by mouth at bedtime.   sertraline 50 MG tablet Commonly known as: ZOLOFT Take 1 tablet (50 mg total) by mouth daily.         Objective:   BP (!) 124/56   Pulse (!) 58   Temp (!) 97.5 F (36.4 C)   Ht 5\' 5"  (1.651 m)   Wt 129 lb (58.5 kg)   SpO2 99%   BMI 21.47 kg/m   Wt Readings from Last 3 Encounters:  05/07/23 129 lb (58.5 kg)  04/25/23 130 lb (59 kg)  02/15/23 129 lb (58.5 kg)    Physical Exam Vitals and nursing note reviewed.  Constitutional:      General: She is not in acute distress.    Appearance: She is well-developed. She is not diaphoretic.  Eyes:     Conjunctiva/sclera: Conjunctivae normal.  Cardiovascular:     Rate and Rhythm: Normal rate and regular rhythm.     Heart sounds: Normal heart sounds. No murmur heard. Pulmonary:     Effort: Pulmonary effort is normal. No respiratory distress.     Breath sounds: Normal breath sounds. No wheezing.  Musculoskeletal:        General: No swelling or tenderness. Normal range of motion.  Skin:    General: Skin is warm and dry.     Findings: No rash.  Neurological:     Mental Status: She is alert and oriented to person, place, and time.     Coordination: Coordination normal.  Psychiatric:        Mood and Affect: Mood is anxious. Mood is not depressed.        Behavior: Behavior normal.       Assessment & Plan:   Problem List Items Addressed This Visit       Musculoskeletal and Integument   Osteoporosis   Relevant Orders   DG [facility] DEXA     Other   Adjustment disorder with mixed anxiety and depressed mood   Relevant Medications   ALPRAZolam (XANAX) 0.25 MG tablet   Hyperlipidemia - Primary    Relevant Orders   CBC with Differential/Platelet   CMP14+EGFR   Lipid panel   DG [facility] DEXA   B12 deficiency   Relevant Medications   cyanocobalamin (VITAMIN B12) injection 1,000 mcg (Start on 05/07/2023 10:00 AM)   Other Visit Diagnoses     Anxiety       Relevant Medications   ALPRAZolam (XANAX) 0.25 MG tablet   Controlled substance agreement signed           Patient is having urinary frequency, she did stop the Myrbetriq but  she is going to try it again, we discussed other possible medications but she is going to try the Myrbetriq first again and then we can discuss those options.  She got B12 injection today a little bit early   she will hold off on the bone density and do that in the future. Follow up plan: Return in about 6 months (around 11/07/2023), or if symptoms worsen or fail to improve, for Hyperlipidemia and osteoporosis and recheck.  Counseling provided for all of the vaccine components Orders Placed This Encounter  Procedures   DG [facility] DEXA   CBC with Differential/Platelet   CMP14+EGFR   Lipid panel    Arville Care, MD [facility] 05/07/2023, 9:52 AM

## 2023-05-08 LAB — CBC WITH DIFFERENTIAL/PLATELET
Basophils Absolute: 0 10*3/uL (ref 0.0–0.2)
Basos: 1 %
EOS (ABSOLUTE): 0.1 10*3/uL (ref 0.0–0.4)
Eos: 1 %
Hematocrit: 38.5 % (ref 34.0–46.6)
Hemoglobin: 12.8 g/dL (ref 11.1–15.9)
Immature Grans (Abs): 0 10*3/uL (ref 0.0–0.1)
Immature Granulocytes: 0 %
Lymphocytes Absolute: 1.9 10*3/uL (ref 0.7–3.1)
Lymphs: 33 %
MCH: 32.9 pg (ref 26.6–33.0)
MCHC: 33.2 g/dL (ref 31.5–35.7)
MCV: 99 fL — ABNORMAL HIGH (ref 79–97)
Monocytes Absolute: 0.4 10*3/uL (ref 0.1–0.9)
Monocytes: 7 %
Neutrophils Absolute: 3.3 10*3/uL (ref 1.4–7.0)
Neutrophils: 58 %
Platelets: 150 10*3/uL (ref 150–450)
RBC: 3.89 x10E6/uL (ref 3.77–5.28)
RDW: 11.3 % — ABNORMAL LOW (ref 11.7–15.4)
WBC: 5.7 10*3/uL (ref 3.4–10.8)

## 2023-05-08 LAB — CMP14+EGFR
ALT: 11 IU/L (ref 0–32)
AST: 11 IU/L (ref 0–40)
Albumin/Globulin Ratio: 2.2 (ref 1.2–2.2)
Albumin: 4.6 g/dL (ref 3.7–4.7)
Alkaline Phosphatase: 76 IU/L (ref 44–121)
BUN/Creatinine Ratio: 30 — ABNORMAL HIGH (ref 12–28)
BUN: 18 mg/dL (ref 8–27)
Bilirubin Total: 0.4 mg/dL (ref 0.0–1.2)
CO2: 25 mmol/L (ref 20–29)
Calcium: 9 mg/dL (ref 8.7–10.3)
Chloride: 104 mmol/L (ref 96–106)
Creatinine, Ser: 0.61 mg/dL (ref 0.57–1.00)
Globulin, Total: 2.1 g/dL (ref 1.5–4.5)
Glucose: 95 mg/dL (ref 70–99)
Potassium: 4.5 mmol/L (ref 3.5–5.2)
Sodium: 143 mmol/L (ref 134–144)
Total Protein: 6.7 g/dL (ref 6.0–8.5)
eGFR: 88 mL/min/{1.73_m2} (ref >59)

## 2023-05-08 LAB — LIPID PANEL
Chol/HDL Ratio: 2.5 ratio (ref 0.0–4.4)
Cholesterol, Total: 160 mg/dL (ref 100–199)
HDL: 65 mg/dL (ref >39)
LDL Chol Calc (NIH): 79 mg/dL (ref 0–99)
Triglycerides: 86 mg/dL (ref 0–149)
VLDL Cholesterol Cal: 16 mg/dL (ref 5–40)

## 2023-05-09 ENCOUNTER — Telehealth: Payer: Self-pay | Admitting: Family Medicine

## 2023-05-09 NOTE — Telephone Encounter (Signed)
Yes Morrie Sheldon can you go ahead and reach out and ask her what she wants to know concerning her B12.  We did the injection the other day when she typically gets it once a month

## 2023-05-09 NOTE — Telephone Encounter (Signed)
Please let her know that I have not had a chance to call her today because we have been busy life she can send me a MyChart message that comes directly to me and then I can communicate through that.

## 2023-05-09 NOTE — Telephone Encounter (Signed)
Dr. Louanne Skye,  The pt will not speak to me in regards to what she needs. She was informed that you are in clinic and that you asked me to call her to see what she needed. Pt states that she only wants to speak with you. She does state that her B12 is fine so I am not really sure what she is needing.

## 2023-05-09 NOTE — Telephone Encounter (Signed)
Please have nurse to call patient back regarding her B12. Asked patient for more details regarding the B12 but she said she wasn't going to go in to all of that with me and to just have Dr Dettinger call her. Explained to patient that he is very busy because he just got back from vacation so we would have his nurse reach out to her regarding B12.

## 2023-05-10 NOTE — Telephone Encounter (Signed)
Tried calling pt. Number busy x2.  If pt returns call please put her in a same day appt for a virtual visit. Or if she can communicate through mychart per Dettinger.

## 2023-05-11 NOTE — Telephone Encounter (Signed)
Called pt again. No answer, no vmail. Phone rung multiple times. Will close encounter since this was the third attempt to reach pt.

## 2023-05-17 DIAGNOSIS — H8113 Benign paroxysmal vertigo, bilateral: Secondary | ICD-10-CM | POA: Diagnosis not present

## 2023-05-17 DIAGNOSIS — H8110 Benign paroxysmal vertigo, unspecified ear: Secondary | ICD-10-CM | POA: Diagnosis not present

## 2023-05-17 DIAGNOSIS — R2689 Other abnormalities of gait and mobility: Secondary | ICD-10-CM | POA: Diagnosis not present

## 2023-05-17 DIAGNOSIS — H608X3 Other otitis externa, bilateral: Secondary | ICD-10-CM | POA: Diagnosis not present

## 2023-05-29 ENCOUNTER — Other Ambulatory Visit: Payer: Self-pay

## 2023-05-29 ENCOUNTER — Ambulatory Visit (INDEPENDENT_AMBULATORY_CARE_PROVIDER_SITE_OTHER): Payer: Medicare Other | Admitting: *Deleted

## 2023-05-29 DIAGNOSIS — E538 Deficiency of other specified B group vitamins: Secondary | ICD-10-CM

## 2023-05-29 MED ORDER — CYANOCOBALAMIN 1000 MCG/ML IJ SOLN
1000.0000 ug | Freq: Once | INTRAMUSCULAR | Status: AC
Start: 2023-05-29 — End: 2023-05-29
  Administered 2023-05-29: 1000 ug via INTRAMUSCULAR

## 2023-05-29 NOTE — Progress Notes (Signed)
Pt came in B12injcetion given on L-arm. Pt tol well. Pt left ambulatory w/no c/o

## 2023-06-06 DIAGNOSIS — Z961 Presence of intraocular lens: Secondary | ICD-10-CM | POA: Diagnosis not present

## 2023-06-26 ENCOUNTER — Ambulatory Visit (INDEPENDENT_AMBULATORY_CARE_PROVIDER_SITE_OTHER): Payer: Medicare Other

## 2023-06-26 DIAGNOSIS — E538 Deficiency of other specified B group vitamins: Secondary | ICD-10-CM | POA: Diagnosis not present

## 2023-06-26 NOTE — Progress Notes (Signed)
B12 injection right deltoid patient tolerated well

## 2023-07-02 ENCOUNTER — Telehealth: Payer: Self-pay | Admitting: Family Medicine

## 2023-07-02 ENCOUNTER — Ambulatory Visit: Payer: Medicare Other | Admitting: Nurse Practitioner

## 2023-07-02 ENCOUNTER — Ambulatory Visit (INDEPENDENT_AMBULATORY_CARE_PROVIDER_SITE_OTHER): Payer: Medicare Other | Admitting: Family Medicine

## 2023-07-02 ENCOUNTER — Encounter: Payer: Self-pay | Admitting: Family Medicine

## 2023-07-02 VITALS — BP 120/76 | HR 74 | Ht 65.0 in | Wt 129.0 lb

## 2023-07-02 DIAGNOSIS — S81819A Laceration without foreign body, unspecified lower leg, initial encounter: Secondary | ICD-10-CM

## 2023-07-02 NOTE — Telephone Encounter (Signed)
Appt t 9:10 today with Dettinger

## 2023-07-02 NOTE — Telephone Encounter (Signed)
Patient has an appt with DOD today 7/15 but wants to speak with PCP nurse. Please call back

## 2023-07-02 NOTE — Progress Notes (Signed)
BP 120/76   Pulse 74   Ht 5\' 5"  (1.651 m)   Wt 129 lb (58.5 kg)   SpO2 95%   BMI 21.47 kg/m    Subjective:   Patient ID: Rose Martinez, female    DOB: 31-Mar-1935, 88 y.o.   MRN: [id number]  HPI: Rose Martinez is a 88 y.o. female presenting on 07/02/2023 for Leg Injury (RLE-hit a piece of furniture )   HPI Skin tear/wound on right lower extremity Patient says she hit her leg about a week and a half ago on her right lower leg on a piece of furniture at her house.  She thinks she scraped on the wood piece but there is a possibility it could be a metal piece.  She has been using peroxide and Polysporin on it for the past week and a half and was just concerned about it want to get it to come look at.  She denies any fevers or chills or drainage besides a little bit of bleeding.  Relevant past medical, surgical, family and social history reviewed and updated as indicated. Interim medical history since our last visit reviewed. Allergies and medications reviewed and updated.  Review of Systems  Constitutional:  Negative for chills and fever.  Skin:  Positive for wound. Negative for color change and rash.    Per HPI unless specifically indicated above   Allergies as of 07/02/2023       Reactions   Statins    myalgias   Cephalexin Other (See Comments)   Pt does not remember   Penicillins Other (See Comments)   Pt's mother was severly allergic, so she has never taken PCN.        Medication List        Accurate as of July 02, 2023  9:57 AM. If you have any questions, ask your nurse or doctor.          ALIGN PO Take 1 capsule by mouth daily.   ALPRAZolam 0.25 MG tablet Commonly known as: XANAX Take 1 tablet (0.25 mg total) by mouth daily as needed for anxiety.   betamethasone (augmented) 0.05 % lotion Commonly known as: DIPROLENE Apply 1 application topically daily.   betamethasone dipropionate 0.05 % lotion Apply topically as needed.   cholecalciferol 1000  units tablet Commonly known as: VITAMIN D Take 2,000 Units by mouth daily.   Ciprodex OTIC suspension Generic drug: ciprofloxacin-dexamethasone Place 4 drops into the left ear in the morning, at noon, in the evening, and at bedtime.   fluticasone 50 MCG/ACT nasal spray Commonly known as: FLONASE 1 SPRAY IN EACH NOSTRIL ONCE DAILY.   guaiFENesin 600 MG 12 hr tablet Commonly known as: MUCINEX Take 600 mg by mouth daily.   Hydrocortisone Butyrate 0.1 % Lotn Apply 1 application topically as needed.   loratadine 10 MG tablet Commonly known as: CLARITIN Take 10 mg by mouth daily as needed.   montelukast 10 MG tablet Commonly known as: SINGULAIR TAKE (1) TABLET DAILY AS DIRECTED.   mupirocin ointment 2 % Commonly known as: BACTROBAN Apply 1 application topically 2 (two) times daily. to affected area(s)   Myrbetriq 50 MG Tb24 tablet Generic drug: mirabegron ER Take 50 mg by mouth daily as needed.   propranolol 20 MG tablet Commonly known as: INDERAL Take 1 tablet (20 mg total) by mouth daily as needed.   rosuvastatin 5 MG tablet Commonly known as: CRESTOR Take 1 tablet (5 mg total) by mouth at bedtime.  sertraline 50 MG tablet Commonly known as: ZOLOFT Take 1 tablet (50 mg total) by mouth daily.         Objective:   BP 120/76   Pulse 74   Ht 5\' 5"  (1.651 m)   Wt 129 lb (58.5 kg)   SpO2 95%   BMI 21.47 kg/m   Wt Readings from Last 3 Encounters:  07/02/23 129 lb (58.5 kg)  05/07/23 129 lb (58.5 kg)  04/25/23 130 lb (59 kg)    Physical Exam Vitals and nursing note reviewed.  Constitutional:      Appearance: Normal appearance.  Skin:      Neurological:     Mental Status: She is alert.     Wash with simple soap and water daily and place topical antibiotic and nonstick dressing gauze on it every day.  After he starts to get a scab and let it dry to heal, continue to wear compression stockings.  Assessment & Plan:   Problem List Items Addressed This  Visit   None Visit Diagnoses     Skin tear of lower leg without complication, initial encounter    -  Primary       I offered a tetanus booster today and patient refused. Follow up plan: Return if symptoms worsen or fail to improve.  Counseling provided for all of the vaccine components No orders of the defined types were placed in this encounter.   Arville Care, MD [facility] 07/02/2023, 9:57 AM

## 2023-07-04 ENCOUNTER — Telehealth: Payer: Self-pay | Admitting: Family Medicine

## 2023-07-04 MED ORDER — ROSUVASTATIN CALCIUM 5 MG PO TABS: 5.0000 mg | ORAL_TABLET | ORAL | 3 refills | Status: DC

## 2023-07-04 NOTE — Telephone Encounter (Signed)
Please advise on directions and send new script if appropriate

## 2023-07-04 NOTE — Telephone Encounter (Signed)
Sent updated prescription for Crestor

## 2023-07-04 NOTE — Addendum Note (Signed)
Addended by: Arville Care on: 07/04/2023 11:44 AM   Modules accepted: Orders

## 2023-07-05 ENCOUNTER — Telehealth: Payer: Self-pay | Admitting: Family Medicine

## 2023-07-05 NOTE — Telephone Encounter (Signed)
Patient had a visit with Dr. Louanne Skye on 07/02/23 and states her leg is not any better. She has been using polysporin and thinks she needs something stronger.  Covering PCP please advise

## 2023-07-09 ENCOUNTER — Telehealth: Payer: Self-pay

## 2023-07-09 NOTE — Telephone Encounter (Signed)
Pt walked in this morning requesting that her leg be rechecked.  Pt had a skin tear a few weeks ago and pt feels that it is not improving. She has been applying polysporin, non stick gauze and coban daily. She cleans with reg soap and water.  The site on RLE looks clean, not infected today. It is red around the site. About an inch all the way around. When pt cam e in today she had a large bandaid and surgical tape on each end of it.  Appt has been made this Friday for reassurance.  Pt advised to continue to clean daily as she has been doing. Hold polysporin and only apply vaseline, non stick gauze and coban daily. Continue to wear support hose.   Informed pt that she could be having a reaction to polysporin and adhesive bandages. She will d//c those.  Pt is very discouraged about the length of time it will take to heal the site.

## 2023-07-13 ENCOUNTER — Encounter: Payer: Self-pay | Admitting: Family Medicine

## 2023-07-13 ENCOUNTER — Ambulatory Visit: Payer: Medicare Other | Admitting: Family Medicine

## 2023-07-13 VITALS — BP 101/60 | HR 64 | Temp 98.7°F | Wt 130.0 lb

## 2023-07-13 DIAGNOSIS — S81811D Laceration without foreign body, right lower leg, subsequent encounter: Secondary | ICD-10-CM | POA: Diagnosis not present

## 2023-07-13 MED ORDER — SILVER SULFADIAZINE 1 % EX CREA
1.0000 | TOPICAL_CREAM | Freq: Every day | CUTANEOUS | 0 refills | Status: DC
Start: 2023-07-13 — End: 2023-09-12

## 2023-07-13 NOTE — Progress Notes (Signed)
Subjective: CC: Skin tear PCP: Dettinger, Elige Radon, MD GMW:NUUVO Rose Martinez is a 88 y.o. female presenting to clinic today for:  1.  Skin tear Patient here to follow-up on skin tear of the right lower extremity.  She describes to me the events that occurred leading up to the skin tear.  She last saw her PCP on 07/02/2023.  She reports that the wound just seems slow to heal and that is what concerns her.  Sometimes she sees little redness around it but nothing that looks markedly different than it has looked.  She has been applying Vaseline to the affected area as well as putting nonstick bandages on it in efforts to heal it.  She washes it with soap and water as directed and avoid use of peroxide now.  She is eager to get this healed because she is tired of messing with the bandages.  Comes today for advice.   ROS: Per HPI  Allergies  Allergen Reactions   Statins     myalgias   Cephalexin Other (See Comments)    Pt does not remember    Penicillins Other (See Comments)    Pt's mother was severly allergic, so she has never taken PCN.   Past Medical History:  Diagnosis Date   Anxiety    Hyperlipidemia    Mild   Osteoporosis    Palpitations     Current Outpatient Medications:    ALPRAZolam (XANAX) 0.25 MG tablet, Take 1 tablet (0.25 mg total) by mouth daily as needed for anxiety., Disp: 30 tablet, Rfl: 2   betamethasone dipropionate 0.05 % lotion, Apply topically as needed., Disp: , Rfl:    betamethasone, augmented, (DIPROLENE) 0.05 % lotion, Apply 1 application topically daily., Disp: , Rfl:    cholecalciferol (VITAMIN D) 1000 UNITS tablet, Take 2,000 Units by mouth daily. , Disp: , Rfl:    CIPRODEX OTIC suspension, Place 4 drops into the left ear in the morning, at noon, in the evening, and at bedtime., Disp: , Rfl:    fluticasone (FLONASE) 50 MCG/ACT nasal spray, 1 SPRAY IN EACH NOSTRIL ONCE DAILY., Disp: 16 g, Rfl: 11   guaiFENesin (MUCINEX) 600 MG 12 hr tablet, Take 600 mg by  mouth daily., Disp: , Rfl:    Hydrocortisone Butyrate 0.1 % LOTN, Apply 1 application topically as needed., Disp: , Rfl:    loratadine (CLARITIN) 10 MG tablet, Take 10 mg by mouth daily as needed., Disp: , Rfl:    montelukast (SINGULAIR) 10 MG tablet, TAKE (1) TABLET DAILY AS DIRECTED., Disp: 30 tablet, Rfl: 0   mupirocin ointment (BACTROBAN) 2 %, Apply 1 application topically 2 (two) times daily. to affected area(s), Disp: , Rfl:    MYRBETRIQ 50 MG TB24 tablet, Take 50 mg by mouth daily as needed., Disp: , Rfl:    Probiotic Product (ALIGN PO), Take 1 capsule by mouth daily., Disp: , Rfl:    propranolol (INDERAL) 20 MG tablet, Take 1 tablet (20 mg total) by mouth daily as needed., Disp: 90 tablet, Rfl: 3   rosuvastatin (CRESTOR) 5 MG tablet, Take 1 tablet (5 mg total) by mouth every other day., Disp: 45 tablet, Rfl: 3   sertraline (ZOLOFT) 50 MG tablet, Take 1 tablet (50 mg total) by mouth daily., Disp: 90 tablet, Rfl: 3  Current Facility-Administered Medications:    cyanocobalamin (VITAMIN B12) injection 1,000 mcg, 1,000 mcg, Intramuscular, Q30 days, Dettinger, Elige Radon, MD, 1,000 mcg at 06/26/23 1509 Social History   Socioeconomic History  Marital status: Divorced    Spouse name: Not on file   Number of children: 1   Years of education: Not on file   Highest education level: Not on file  Occupational History   Not on file  Tobacco Use   Smoking status: Former    Current packs/day: 0.00    Types: Cigarettes    Quit date: 03/27/1957    Years since quitting: 66.3   Smokeless tobacco: Never  Vaping Use   Vaping status: Never Used  Substance and Sexual Activity   Alcohol use: No   Drug use: No   Sexual activity: Not on file  Other Topics Concern   Not on file  Social History Narrative   Her one child and 2 grandchildren live in [place].      Right handed   Social Determinants of Health   Financial Resource Strain: Not on file  Food Insecurity: Not on file   Transportation Needs: Not on file  Physical Activity: Not on file  Stress: Not on file  Social Connections: Not on file  Intimate Partner Violence: Not on file   Family History  Problem Relation Age of Onset   Heart attack Father    Other Mother        ployarthritis Nodosa    Objective: Office vital signs reviewed. BP 101/60   Pulse 64   Temp 98.7 F (37.1 C)   Wt 130 lb (59 kg)   SpO2 100%   BMI 21.63 kg/m   Physical Examination:  General: Awake, alert, well nourished, No acute distress Skin: Right anterior lower leg with 2.5 cm x 2.5 cm irregularly shaped skin tear.  Minimal bloody discharge.  No exudate.  No warmth.  Minimal erythema surrounding the edges of the wound but these are again not warm and not indurated.  Assessment/ Plan: 88 y.o. female   Noninfected skin tear of right lower extremity, subsequent encounter - Plan: silver sulfADIAZINE (SSD) 1 % cream  Would like her to abandon use of Polysporin.  Wonder if she might be having localized site reaction to this as there was minimal redness around the wound today.  I have placed her on SSD to apply to the affected area for 5 to 7 days daily.  We discussed use of Xeroform bandages and wound care today.  She may follow-up with PCP in the next few weeks if needed.  Otherwise discussed appropriate expectations including time to have healing and when she can discontinue wound care.  She voiced good understanding of plan and will follow-up as needed  No orders of the defined types were placed in this encounter.  No orders of the defined types were placed in this encounter.    Raliegh Ip, DO [facility] [phone]

## 2023-07-17 ENCOUNTER — Telehealth: Payer: Self-pay | Admitting: Family Medicine

## 2023-07-17 NOTE — Telephone Encounter (Signed)
Yes she should be applying a scant amount of that before she puts on the medicated bandage I gave her.

## 2023-07-17 NOTE — Telephone Encounter (Signed)
Attempted to contact- NA  Need more information

## 2023-07-17 NOTE — Telephone Encounter (Signed)
PATIENT AWARE

## 2023-07-17 NOTE — Telephone Encounter (Signed)
Pt wants to know if she should use them both at the same time or if she should use just the SSD1% Cream by itself on her wound.She really likes the SSD cream, she says this has really been helping her wound. When calling back please let it ring atleast 12 times pt has a large home

## 2023-07-24 ENCOUNTER — Ambulatory Visit (INDEPENDENT_AMBULATORY_CARE_PROVIDER_SITE_OTHER): Payer: Medicare Other

## 2023-07-24 DIAGNOSIS — E538 Deficiency of other specified B group vitamins: Secondary | ICD-10-CM

## 2023-07-24 MED ORDER — CYANOCOBALAMIN 1000 MCG/ML IJ SOLN
1000.0000 ug | Freq: Once | INTRAMUSCULAR | Status: AC
Start: 2023-07-24 — End: 2023-07-24
  Administered 2023-07-24: 1000 ug via INTRAMUSCULAR

## 2023-07-24 NOTE — Progress Notes (Signed)
Cyanocobalamin injection given to left deltoid.  Patient tolerated well. 

## 2023-07-30 IMAGING — DX DG LUMBAR SPINE 2-3V
3 series · 3 of 3 positions shown · non-contrast
Comparison: None.

CLINICAL DATA: Low back pain.  Lumbar strain

EXAM:
LUMBAR SPINE - 2-3 VIEW

[l-spine ap (1 of 2)]
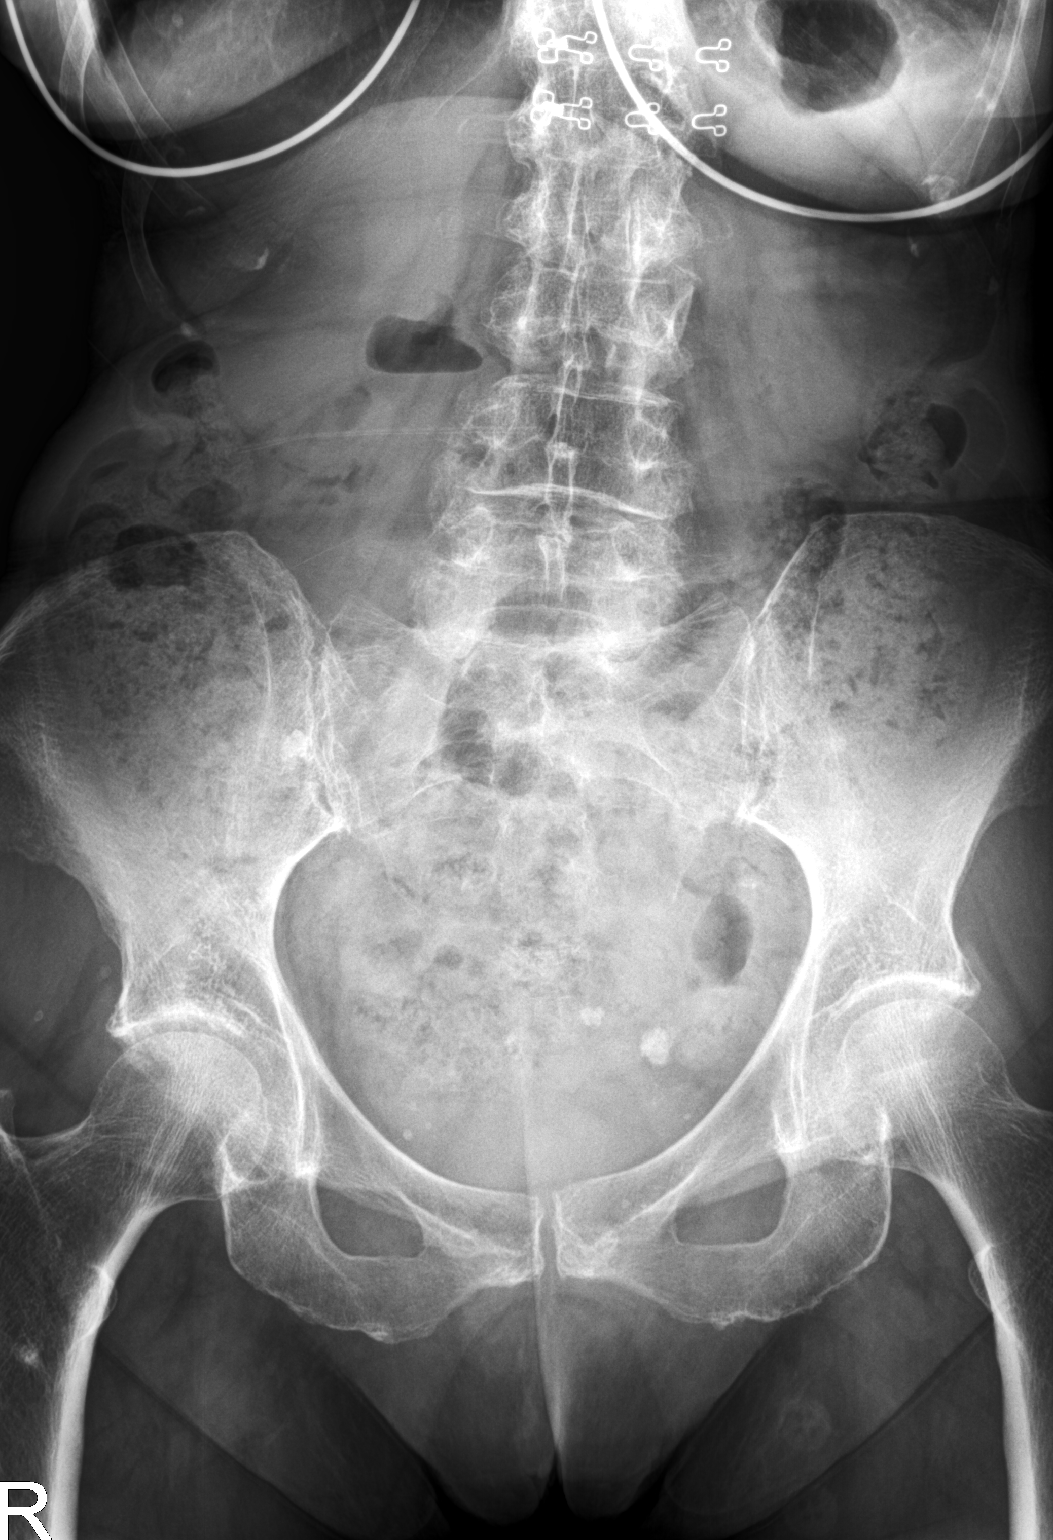

[l-spine ap (2 of 2)]
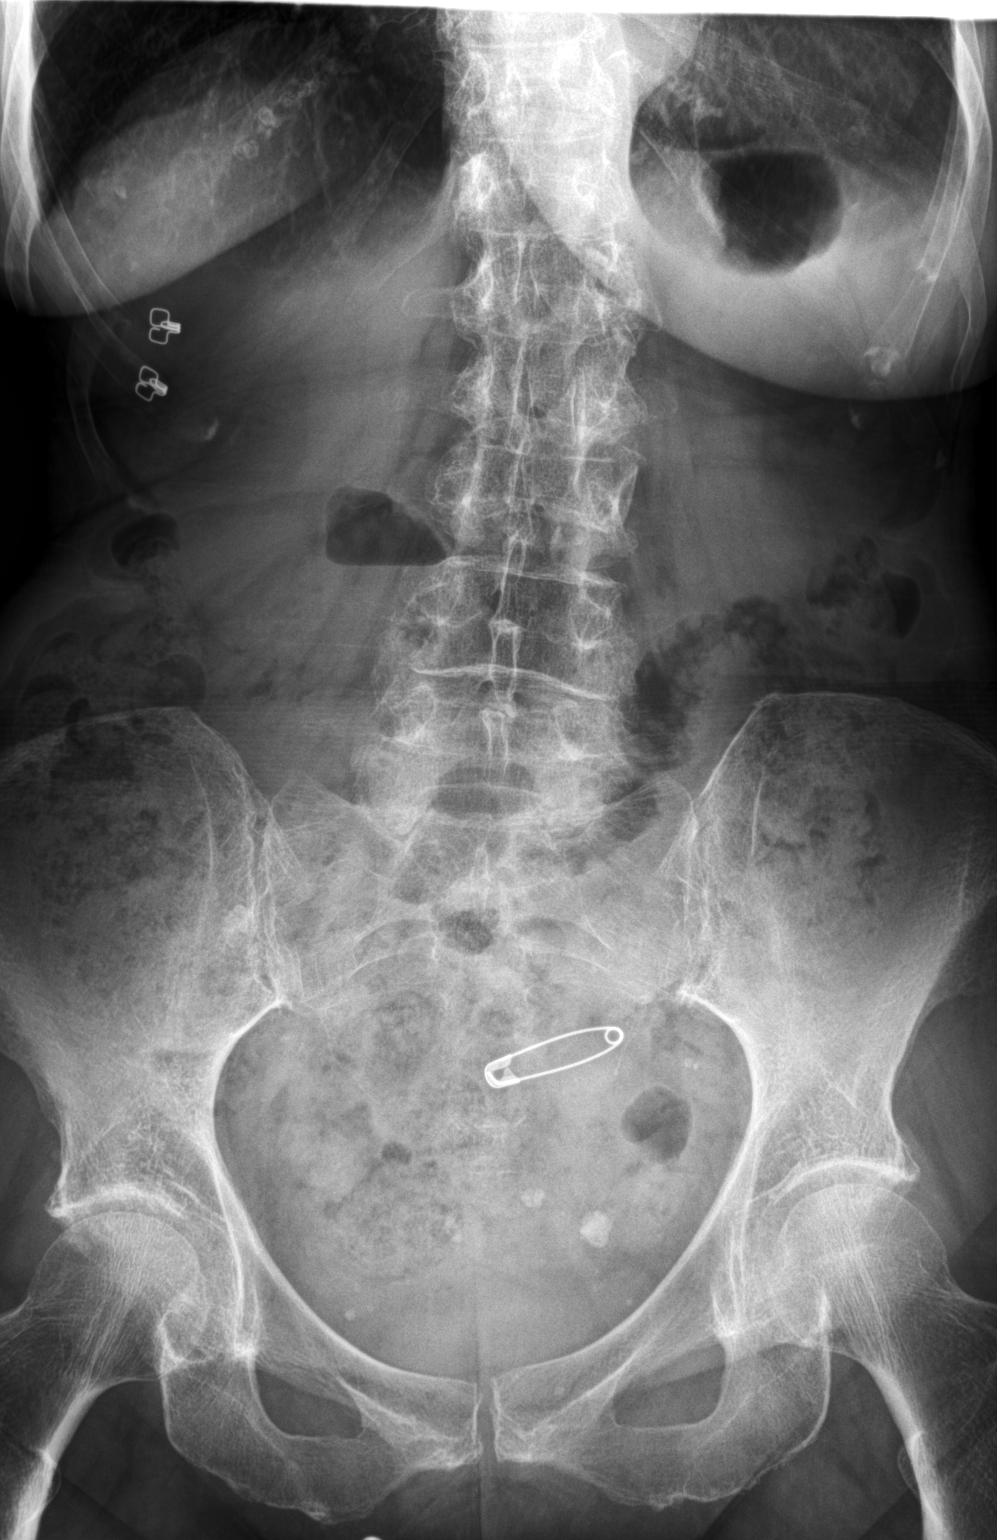

[l-spine lat]
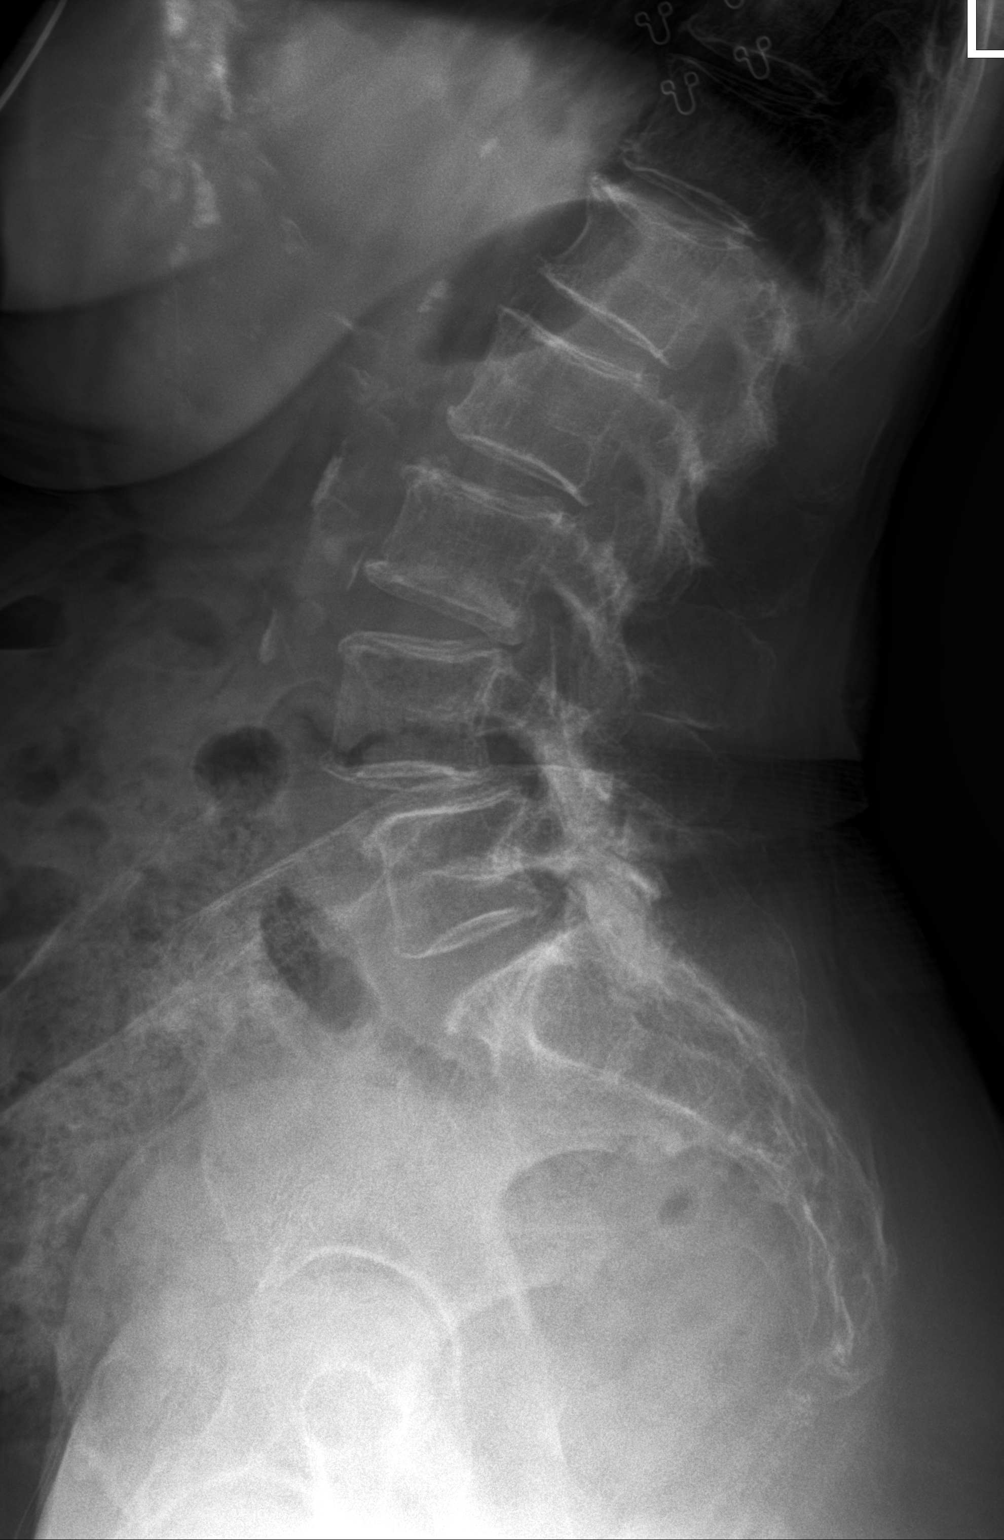

[3 of 3 positions shown; findings below may reference images not displayed]

FINDINGS: Grade 1 anterolisthesis L4-5.  Remaining alignment normal

Negative for fracture. Mild disc degeneration throughout the lumbar
spine with mild disc space narrowing and mild spur. Mild facet
degeneration L4-5 and L5-S1. SI joints intact.
IMPRESSION: Mild lumbar disc degeneration. Grade 1 anterolisthesis L4-5. No
acute abnormality.

1 cm calcification left pelvis likely vascular however possible
ureteral calculus.

## 2023-08-21 ENCOUNTER — Ambulatory Visit (INDEPENDENT_AMBULATORY_CARE_PROVIDER_SITE_OTHER): Payer: Medicare Other | Admitting: *Deleted

## 2023-08-21 DIAGNOSIS — E538 Deficiency of other specified B group vitamins: Secondary | ICD-10-CM

## 2023-08-21 MED ORDER — CYANOCOBALAMIN 1000 MCG/ML IJ SOLN
1000.0000 ug | Freq: Once | INTRAMUSCULAR | Status: AC
Start: 2023-08-21 — End: 2023-08-21
  Administered 2023-08-21: 1000 ug via INTRAMUSCULAR

## 2023-08-21 NOTE — Progress Notes (Signed)
Pt given B12 injection IM right deltoid and tolerated well. 

## 2023-09-07 ENCOUNTER — Telehealth: Payer: Self-pay | Admitting: Family Medicine

## 2023-09-07 NOTE — Telephone Encounter (Signed)
Her B12 levels have been normal at this point so I would not necessarily increase them for energy.  If she feels like she needs energy she can take some over-the-counter vitamin D every day and sometimes that can help and we can look into other testing such as thyroid and anemia when she comes in for her next blood work.

## 2023-09-07 NOTE — Telephone Encounter (Signed)
Pt called stating that she feels like she needs to be taking B12 more than 1x a month because she just doesn't feel great. Says she doesn't need an appt because Dr Dettinger knows her history and what all she's got going on but just wanted to see what Dr Dettinger thought about increasing her B12 to more than just 1x a month.

## 2023-09-10 NOTE — Telephone Encounter (Signed)
Patient aware, scheduled an appointment.

## 2023-09-12 ENCOUNTER — Encounter: Payer: Self-pay | Admitting: Family Medicine

## 2023-09-12 ENCOUNTER — Ambulatory Visit (INDEPENDENT_AMBULATORY_CARE_PROVIDER_SITE_OTHER): Payer: Medicare Other | Admitting: Family Medicine

## 2023-09-12 ENCOUNTER — Other Ambulatory Visit: Payer: Self-pay | Admitting: Family Medicine

## 2023-09-12 VITALS — BP 118/64 | HR 61 | Ht 65.0 in | Wt 130.0 lb

## 2023-09-12 DIAGNOSIS — E559 Vitamin D deficiency, unspecified: Secondary | ICD-10-CM

## 2023-09-12 DIAGNOSIS — F4323 Adjustment disorder with mixed anxiety and depressed mood: Secondary | ICD-10-CM

## 2023-09-12 DIAGNOSIS — F419 Anxiety disorder, unspecified: Secondary | ICD-10-CM

## 2023-09-12 DIAGNOSIS — R5383 Other fatigue: Secondary | ICD-10-CM

## 2023-09-12 DIAGNOSIS — E538 Deficiency of other specified B group vitamins: Secondary | ICD-10-CM

## 2023-09-12 MED ORDER — SERTRALINE HCL 50 MG PO TABS
50.0000 mg | ORAL_TABLET | Freq: Every day | ORAL | Status: DC
Start: 2023-09-12 — End: 2023-10-30

## 2023-09-12 NOTE — Progress Notes (Signed)
BP 118/64   Pulse 61   Ht 5\' 5"  (1.651 m)   Wt 130 lb (59 kg)   SpO2 98%   BMI 21.63 kg/m    Subjective:   Patient ID: Rose Martinez, female    DOB: Apr 11, 1935, 88 y.o.   MRN: [id number]  HPI: Rose Martinez is a 88 y.o. female presenting on 09/12/2023 for Fatigue (/)   HPI Patient has been having some fatigue and decreased energy recently.  She does feel better when she gets her B12 injections and wonders if she can increase the frequency.  She says it has been going on gradually and increasing but does not necessarily know the cause of it.  She is trying to hydrate well.  She says she is sleeping well.  She has been having some recent congestion and sinus pressure going in front of her head and some postnasal drainage and some early morning nausea.  She denies any acid reflux or heartburn.  She has been off of her anxiety medicine and she wants to go ahead and restart that because she does feel like she has been having some more anxiety and does feel like the sertraline was helping her when she was on it.  She denies any thoughts of hurting herself or suicidal ideations.    09/12/2023    8:11 AM 07/13/2023    8:39 AM 07/02/2023    9:21 AM 05/07/2023    9:21 AM 05/07/2023    9:10 AM  Depression screen PHQ 2/9  Decreased Interest 0 0 0 0 0  Down, Depressed, Hopeless 0 0 0 0 0  PHQ - 2 Score 0 0 0 0 0  Altered sleeping 0 0 0 0   Tired, decreased energy 0 0 1 0   Change in appetite 0 0 0 0   Feeling bad or failure about yourself  0 0 0 0   Trouble concentrating 0 0 0 0   Moving slowly or fidgety/restless 0 0 0 0   Suicidal thoughts 0 0 0 0   PHQ-9 Score 0 0 1 0   Difficult doing work/chores Not difficult at all Not difficult at all Not difficult at all Not difficult at all      Relevant past medical, surgical, family and social history reviewed and updated as indicated. Interim medical history since our last visit reviewed. Allergies and medications reviewed and  updated.  Review of Systems  Constitutional:  Positive for fatigue. Negative for chills and fever.  HENT:  Positive for congestion. Negative for ear pain.   Eyes:  Negative for visual disturbance.  Respiratory:  Negative for chest tightness and shortness of breath.   Cardiovascular:  Negative for chest pain and leg swelling.  Gastrointestinal:  Positive for nausea.  Genitourinary:  Negative for difficulty urinating and dysuria.  Skin:  Negative for rash.  Neurological:  Negative for light-headedness and headaches.  Psychiatric/Behavioral:  Positive for dysphoric mood. Negative for agitation, behavioral problems and sleep disturbance. The patient is nervous/anxious.   All other systems reviewed and are negative.   Per HPI unless specifically indicated above   Allergies as of 09/12/2023       Reactions   Statins    myalgias   Cephalexin Other (See Comments)   Pt does not remember   Penicillins Other (See Comments)   Pt's mother was severly allergic, so she has never taken PCN.        Medication List  Accurate as of September 12, 2023  8:33 AM. If you have any questions, ask your nurse or doctor.          STOP taking these medications    silver sulfADIAZINE 1 % cream Commonly known as: SSD Stopped by: Elige Radon Syon Tews       TAKE these medications    ALIGN PO Take 1 capsule by mouth daily.   ALPRAZolam 0.25 MG tablet Commonly known as: XANAX Take 1 tablet (0.25 mg total) by mouth daily as needed for anxiety.   betamethasone (augmented) 0.05 % lotion Commonly known as: DIPROLENE Apply 1 application topically daily.   betamethasone dipropionate 0.05 % lotion Apply topically as needed.   cholecalciferol 1000 units tablet Commonly known as: VITAMIN D Take 2,000 Units by mouth daily.   Ciprodex OTIC suspension Generic drug: ciprofloxacin-dexamethasone Place 4 drops into the left ear in the morning, at noon, in the evening, and at bedtime.    fluticasone 50 MCG/ACT nasal spray Commonly known as: FLONASE 1 SPRAY IN EACH NOSTRIL ONCE DAILY.   guaiFENesin 600 MG 12 hr tablet Commonly known as: MUCINEX Take 600 mg by mouth daily.   Hydrocortisone Butyrate 0.1 % Lotn Apply 1 application topically as needed.   loratadine 10 MG tablet Commonly known as: CLARITIN Take 10 mg by mouth daily as needed.   montelukast 10 MG tablet Commonly known as: SINGULAIR TAKE (1) TABLET DAILY AS DIRECTED.   mupirocin ointment 2 % Commonly known as: BACTROBAN Apply 1 application topically 2 (two) times daily. to affected area(s)   Myrbetriq 50 MG Tb24 tablet Generic drug: mirabegron ER Take 50 mg by mouth daily as needed.   propranolol 20 MG tablet Commonly known as: INDERAL Take 1 tablet (20 mg total) by mouth daily as needed.   rosuvastatin 5 MG tablet Commonly known as: CRESTOR Take 1 tablet (5 mg total) by mouth every other day.   sertraline 50 MG tablet Commonly known as: ZOLOFT Take 1 tablet (50 mg total) by mouth daily.         Objective:   BP 118/64   Pulse 61   Ht 5\' 5"  (1.651 m)   Wt 130 lb (59 kg)   SpO2 98%   BMI 21.63 kg/m   Wt Readings from Last 3 Encounters:  09/12/23 130 lb (59 kg)  07/13/23 130 lb (59 kg)  07/02/23 129 lb (58.5 kg)    Physical Exam Vitals and nursing note reviewed.  Constitutional:      General: She is not in acute distress.    Appearance: She is well-developed. She is not diaphoretic.  Eyes:     Conjunctiva/sclera: Conjunctivae normal.  Cardiovascular:     Rate and Rhythm: Normal rate and regular rhythm.     Heart sounds: Normal heart sounds. No murmur heard. Pulmonary:     Effort: Pulmonary effort is normal. No respiratory distress.     Breath sounds: Normal breath sounds. No wheezing.  Musculoskeletal:        General: No swelling. Normal range of motion.  Skin:    General: Skin is warm and dry.     Findings: No rash.  Neurological:     Mental Status: She is alert  and oriented to person, place, and time.     Coordination: Coordination normal.  Psychiatric:        Behavior: Behavior normal.       Assessment & Plan:   Problem List Items Addressed This Visit  Other   Adjustment disorder with mixed anxiety and depressed mood   Relevant Medications   sertraline (ZOLOFT) 50 MG tablet   Vitamin D deficiency   Relevant Orders   VITAMIN D 25 Hydroxy (Vit-D Deficiency, Fractures)   B12 deficiency   Relevant Orders   Vitamin B12   Other Visit Diagnoses     Other fatigue    -  Primary   Relevant Orders   CBC with Differential/Platelet   CMP14+EGFR   VITAMIN D 25 Hydroxy (Vit-D Deficiency, Fractures)   Vitamin B12   Thyroid Panel With TSH   Anxiety       Relevant Medications   sertraline (ZOLOFT) 50 MG tablet       She will restart the sertraline to see if it helps with mood.  She is going to add some antihistamine such as Benadryl and loratadine along with doubling the Flonase to twice a day to help with the congestion and then we will circle back and see how she is doing.  Do some blood work today for fatigue. Follow up plan: Return if symptoms worsen or fail to improve, for Has appointment on 11/14 already..  Counseling provided for all of the vaccine components Orders Placed This Encounter  Procedures   CBC with Differential/Platelet   CMP14+EGFR   VITAMIN D 25 Hydroxy (Vit-D Deficiency, Fractures)   Vitamin B12   Thyroid Panel With TSH    Arville Care, MD [facility] 09/12/2023, 8:33 AM

## 2023-09-13 LAB — CMP14+EGFR
ALT: 9 IU/L (ref 0–32)
AST: 13 IU/L (ref 0–40)
Albumin: 4.5 g/dL (ref 3.7–4.7)
Alkaline Phosphatase: 77 IU/L (ref 44–121)
BUN/Creatinine Ratio: 22 (ref 12–28)
BUN: 14 mg/dL (ref 8–27)
Bilirubin Total: 0.4 mg/dL (ref 0.0–1.2)
CO2: 24 mmol/L (ref 20–29)
Calcium: 9.4 mg/dL (ref 8.7–10.3)
Chloride: 106 mmol/L (ref 96–106)
Creatinine, Ser: 0.65 mg/dL (ref 0.57–1.00)
Globulin, Total: 2 g/dL (ref 1.5–4.5)
Glucose: 102 mg/dL — ABNORMAL HIGH (ref 70–99)
Potassium: 4.9 mmol/L (ref 3.5–5.2)
Sodium: 144 mmol/L (ref 134–144)
Total Protein: 6.5 g/dL (ref 6.0–8.5)
eGFR: 86 mL/min/{1.73_m2} (ref >59)

## 2023-09-13 LAB — CBC WITH DIFFERENTIAL/PLATELET
Basophils Absolute: 0 10*3/uL (ref 0.0–0.2)
Basos: 0 %
EOS (ABSOLUTE): 0.1 10*3/uL (ref 0.0–0.4)
Eos: 1 %
Hematocrit: 39.1 % (ref 34.0–46.6)
Hemoglobin: 12.7 g/dL (ref 11.1–15.9)
Immature Grans (Abs): 0 10*3/uL (ref 0.0–0.1)
Immature Granulocytes: 0 %
Lymphocytes Absolute: 1.4 10*3/uL (ref 0.7–3.1)
Lymphs: 25 %
MCH: 33.4 pg — ABNORMAL HIGH (ref 26.6–33.0)
MCHC: 32.5 g/dL (ref 31.5–35.7)
MCV: 103 fL — ABNORMAL HIGH (ref 79–97)
Monocytes Absolute: 0.4 10*3/uL (ref 0.1–0.9)
Monocytes: 8 %
Neutrophils Absolute: 3.6 10*3/uL (ref 1.4–7.0)
Neutrophils: 66 %
Platelets: 159 10*3/uL (ref 150–450)
RBC: 3.8 x10E6/uL (ref 3.77–5.28)
RDW: 11.3 % — ABNORMAL LOW (ref 11.7–15.4)
WBC: 5.5 10*3/uL (ref 3.4–10.8)

## 2023-09-13 LAB — THYROID PANEL WITH TSH
Free Thyroxine Index: 2.2 (ref 1.2–4.9)
T3 Uptake Ratio: 30 % (ref 24–39)
T4, Total: 7.3 ug/dL (ref 4.5–12.0)
TSH: 1.93 u[IU]/mL (ref 0.450–4.500)

## 2023-09-13 LAB — VITAMIN D 25 HYDROXY (VIT D DEFICIENCY, FRACTURES): Vit D, 25-Hydroxy: 61.5 ng/mL (ref 30.0–100.0)

## 2023-09-13 LAB — VITAMIN B12: Vitamin B-12: 875 pg/mL (ref 232–1245)

## 2023-09-18 ENCOUNTER — Ambulatory Visit (INDEPENDENT_AMBULATORY_CARE_PROVIDER_SITE_OTHER): Payer: Medicare Other | Admitting: *Deleted

## 2023-09-18 DIAGNOSIS — E538 Deficiency of other specified B group vitamins: Secondary | ICD-10-CM

## 2023-09-18 MED ORDER — CYANOCOBALAMIN 1000 MCG/ML IJ SOLN
1000.0000 ug | INTRAMUSCULAR | Status: AC
Start: 2023-09-18 — End: 2024-09-12
  Administered 2023-09-18 – 2024-08-29 (×12): 1000 ug via INTRAMUSCULAR

## 2023-09-18 NOTE — Progress Notes (Signed)
Patient is in office today for a nurse visit for B12 Injection. Patient Injection was given in the  Left deltoid. Patient tolerated injection well.

## 2023-09-21 ENCOUNTER — Other Ambulatory Visit: Payer: Self-pay

## 2023-09-21 DIAGNOSIS — E782 Mixed hyperlipidemia: Secondary | ICD-10-CM

## 2023-10-16 ENCOUNTER — Other Ambulatory Visit: Payer: Medicare Other

## 2023-10-16 ENCOUNTER — Ambulatory Visit: Payer: Medicare Other | Admitting: *Deleted

## 2023-10-16 DIAGNOSIS — E538 Deficiency of other specified B group vitamins: Secondary | ICD-10-CM

## 2023-10-16 NOTE — Progress Notes (Signed)
Pt in today for B12 today, tolerated well.

## 2023-10-24 DIAGNOSIS — D1801 Hemangioma of skin and subcutaneous tissue: Secondary | ICD-10-CM | POA: Diagnosis not present

## 2023-10-24 DIAGNOSIS — L84 Corns and callosities: Secondary | ICD-10-CM | POA: Diagnosis not present

## 2023-10-24 DIAGNOSIS — L820 Inflamed seborrheic keratosis: Secondary | ICD-10-CM | POA: Diagnosis not present

## 2023-10-24 DIAGNOSIS — L821 Other seborrheic keratosis: Secondary | ICD-10-CM | POA: Diagnosis not present

## 2023-10-24 DIAGNOSIS — L570 Actinic keratosis: Secondary | ICD-10-CM | POA: Diagnosis not present

## 2023-10-24 DIAGNOSIS — D225 Melanocytic nevi of trunk: Secondary | ICD-10-CM | POA: Diagnosis not present

## 2023-10-24 DIAGNOSIS — L218 Other seborrheic dermatitis: Secondary | ICD-10-CM | POA: Diagnosis not present

## 2023-10-26 ENCOUNTER — Ambulatory Visit: Payer: Medicare Other | Admitting: Family Medicine

## 2023-11-01 ENCOUNTER — Ambulatory Visit: Payer: Medicare Other | Admitting: Family Medicine

## 2023-11-01 ENCOUNTER — Other Ambulatory Visit: Payer: Medicare Other

## 2023-11-01 ENCOUNTER — Encounter: Payer: Self-pay | Admitting: Family Medicine

## 2023-11-01 VITALS — BP 115/70 | HR 66 | Ht 65.0 in | Wt 131.0 lb

## 2023-11-01 DIAGNOSIS — F4323 Adjustment disorder with mixed anxiety and depressed mood: Secondary | ICD-10-CM | POA: Diagnosis not present

## 2023-11-01 DIAGNOSIS — F419 Anxiety disorder, unspecified: Secondary | ICD-10-CM

## 2023-11-01 DIAGNOSIS — L219 Seborrheic dermatitis, unspecified: Secondary | ICD-10-CM

## 2023-11-01 DIAGNOSIS — E782 Mixed hyperlipidemia: Secondary | ICD-10-CM

## 2023-11-01 MED ORDER — PROPRANOLOL HCL 20 MG PO TABS: 20.0000 mg | ORAL_TABLET | Freq: Every day | ORAL | 3 refills | Status: DC | PRN

## 2023-11-01 MED ORDER — SERTRALINE HCL 50 MG PO TABS
50.0000 mg | ORAL_TABLET | Freq: Every day | ORAL | 3 refills | Status: DC
Start: 2023-11-01 — End: 2024-10-22

## 2023-11-01 NOTE — Progress Notes (Signed)
BP 115/70   Pulse 66   Ht 5\' 5"  (1.651 m)   Wt 131 lb (59.4 kg)   SpO2 94%   BMI 21.80 kg/m    Subjective:   Patient ID: Rose Martinez, female    DOB: [birth year], 88 y.o.   MRN: [id number]  HPI: Rose Martinez is a 88 y.o. female presenting on 11/01/2023 for Medical Management of Chronic Issues, Hyperlipidemia, and Hair/Scalp Problem (flaking)   HPI Hyperlipidemia Patient is coming in for recheck of his hyperlipidemia. The patient is currently taking Crestor. They deny any issues with myalgias or history of liver damage from it. They deny any focal numbness or weakness or chest pain.   Patient is seen dermatologist and is to start a topical agent for the scalp.  Anxiety recheck Patient is coming in for anxiety recheck.  She currently takes sertraline and uses the occasional rare alprazolam.  She still has refills on that and has not refilled in some time.  She says she is feeling a lot better and sleeping a lot better and feels like her anxiety is a lot better.    11/01/2023    8:43 AM 09/12/2023    8:11 AM 07/13/2023    8:39 AM 07/02/2023    9:21 AM 05/07/2023    9:21 AM  Depression screen PHQ 2/9  Decreased Interest 0 0 0 0 0  Down, Depressed, Hopeless 0 0 0 0 0  PHQ - 2 Score 0 0 0 0 0  Altered sleeping 0 0 0 0 0  Tired, decreased energy 0 0 0 1 0  Change in appetite 0 0 0 0 0  Feeling bad or failure about yourself  0 0 0 0 0  Trouble concentrating 0 0 0 0 0  Moving slowly or fidgety/restless 0 0 0 0 0  Suicidal thoughts 0 0 0 0 0  PHQ-9 Score 0 0 0 1 0  Difficult doing work/chores Not difficult at all Not difficult at all Not difficult at all Not difficult at all Not difficult at all     Relevant past medical, surgical, family and social history reviewed and updated as indicated. Interim medical history since our last visit reviewed. Allergies and medications reviewed and updated.  Review of Systems  Constitutional:  Negative for chills and fever.  Eyes:   Negative for visual disturbance.  Respiratory:  Negative for chest tightness and shortness of breath.   Cardiovascular:  Negative for chest pain and leg swelling.  Genitourinary:  Negative for difficulty urinating and dysuria.  Musculoskeletal:  Negative for back pain and gait problem.  Skin:  Negative for rash.  Neurological:  Negative for dizziness, light-headedness and headaches.  Psychiatric/Behavioral:  Negative for agitation, behavioral problems, dysphoric mood, self-injury, sleep disturbance and suicidal ideas. The patient is nervous/anxious.   All other systems reviewed and are negative.   Per HPI unless specifically indicated above   Allergies as of 11/01/2023       Reactions   Statins    myalgias   Cephalexin Other (See Comments)   Pt does not remember   Penicillins Other (See Comments)   Pt's mother was severly allergic, so she has never taken PCN.        Medication List        Accurate as of November 01, 2023  9:08 AM. If you have any questions, ask your nurse or doctor.          ALIGN PO Take 1 capsule by  mouth daily.   ALPRAZolam 0.25 MG tablet Commonly known as: XANAX Take 1 tablet (0.25 mg total) by mouth daily as needed for anxiety.   betamethasone (augmented) 0.05 % lotion Commonly known as: DIPROLENE Apply 1 application topically daily.   betamethasone dipropionate 0.05 % lotion Apply topically as needed.   cholecalciferol 1000 units tablet Commonly known as: VITAMIN D Take 2,000 Units by mouth daily.   Ciprodex OTIC suspension Generic drug: ciprofloxacin-dexamethasone Place 4 drops into the left ear in the morning, at noon, in the evening, and at bedtime.   fluticasone 50 MCG/ACT nasal spray Commonly known as: FLONASE 1 SPRAY IN EACH NOSTRIL ONCE DAILY.   guaiFENesin 600 MG 12 hr tablet Commonly known as: MUCINEX Take 600 mg by mouth daily.   Hydrocortisone Butyrate 0.1 % Lotn Apply 1 application topically as needed.    loratadine 10 MG tablet Commonly known as: CLARITIN Take 10 mg by mouth daily as needed.   montelukast 10 MG tablet Commonly known as: SINGULAIR TAKE (1) TABLET DAILY AS DIRECTED.   mupirocin ointment 2 % Commonly known as: BACTROBAN Apply 1 application topically 2 (two) times daily. to affected area(s)   Myrbetriq 50 MG Tb24 tablet Generic drug: mirabegron ER TAKE ONE TABLET DAILY   propranolol 20 MG tablet Commonly known as: INDERAL Take 1 tablet (20 mg total) by mouth daily as needed.   rosuvastatin 5 MG tablet Commonly known as: CRESTOR Take 1 tablet (5 mg total) by mouth every other day.   sertraline 50 MG tablet Commonly known as: ZOLOFT Take 1 tablet (50 mg total) by mouth daily.         Objective:   BP 115/70   Pulse 66   Ht 5\' 5"  (1.651 m)   Wt 131 lb (59.4 kg)   SpO2 94%   BMI 21.80 kg/m   Wt Readings from Last 3 Encounters:  11/01/23 131 lb (59.4 kg)  09/12/23 130 lb (59 kg)  07/13/23 130 lb (59 kg)    Physical Exam Vitals and nursing note reviewed.  Constitutional:      General: She is not in acute distress.    Appearance: She is well-developed. She is not diaphoretic.  Eyes:     Conjunctiva/sclera: Conjunctivae normal.  Cardiovascular:     Rate and Rhythm: Normal rate and regular rhythm.     Heart sounds: Normal heart sounds. No murmur heard. Pulmonary:     Effort: Pulmonary effort is normal. No respiratory distress.     Breath sounds: Normal breath sounds. No wheezing.  Musculoskeletal:        General: No swelling or tenderness. Normal range of motion.  Skin:    General: Skin is warm and dry.     Findings: No rash.  Neurological:     Mental Status: She is alert and oriented to person, place, and time.     Coordination: Coordination normal.  Psychiatric:        Behavior: Behavior normal.       Assessment & Plan:   Problem List Items Addressed This Visit       Other   Adjustment disorder with mixed anxiety and depressed  mood   Relevant Medications   sertraline (ZOLOFT) 50 MG tablet   Hyperlipidemia - Primary   Relevant Medications   propranolol (INDERAL) 20 MG tablet   Other Relevant Orders   CMP14+EGFR   Lipid panel   Other Visit Diagnoses     Anxiety       Relevant  Medications   sertraline (ZOLOFT) 50 MG tablet   Seborrheic dermatitis           Continue current medicine, seems to be doing well, will check cholesterol levels today.  She is tach about starting an anti-inflammatory's zoryve on her scalp from the dermatologist. Follow up plan: Return in about 6 months (around 04/30/2024), or if symptoms worsen or fail to improve, for Anxiety and cholesterol recheck.  Counseling provided for all of the vaccine components Orders Placed This Encounter  Procedures   CMP14+EGFR   Lipid panel    Arville Care, MD [facility] 11/01/2023, 9:08 AM

## 2023-11-02 LAB — LIPID PANEL
Chol/HDL Ratio: 2.8 ratio (ref 0.0–4.4)
Cholesterol, Total: 164 mg/dL (ref 100–199)
HDL: 58 mg/dL (ref >39)
LDL Chol Calc (NIH): 90 mg/dL (ref 0–99)
Triglycerides: 88 mg/dL (ref 0–149)
VLDL Cholesterol Cal: 16 mg/dL (ref 5–40)

## 2023-11-02 LAB — CMP14+EGFR
ALT: 10 [IU]/L (ref 0–32)
AST: 14 [IU]/L (ref 0–40)
Albumin: 4.4 g/dL (ref 3.7–4.7)
Alkaline Phosphatase: 77 [IU]/L (ref 44–121)
BUN/Creatinine Ratio: 30 — ABNORMAL HIGH (ref 12–28)
BUN: 18 mg/dL (ref 8–27)
Bilirubin Total: 0.3 mg/dL (ref 0.0–1.2)
CO2: 25 mmol/L (ref 20–29)
Calcium: 9.6 mg/dL (ref 8.7–10.3)
Chloride: 105 mmol/L (ref 96–106)
Creatinine, Ser: 0.61 mg/dL (ref 0.57–1.00)
Globulin, Total: 2.1 g/dL (ref 1.5–4.5)
Glucose: 76 mg/dL (ref 70–99)
Potassium: 5.1 mmol/L (ref 3.5–5.2)
Sodium: 144 mmol/L (ref 134–144)
Total Protein: 6.5 g/dL (ref 6.0–8.5)
eGFR: 87 mL/min/{1.73_m2} (ref >59)

## 2023-11-04 NOTE — Progress Notes (Unsigned)
  Cardiology Office Note:   Date:  11/07/2023  ID:  Rose Martinez, DOB 1935-08-12, MRN [id number] PCP: Dettinger, Elige Radon, MD  [facility] Providers Cardiologist:  None {  History of Present Illness:   Rose Martinez is a 88 y.o. female who presents for followup of palpitations.  She has not been bothered by these recently.  The patient denies any new symptoms such as chest discomfort, neck or arm discomfort. There has been no new shortness of breath, PND or orthopnea. There have been no reported palpitations, presyncope or syncope. She stays active.  She has 20 deep steps at her "home place" named [place].   She is active around this property.    ROS:   Positive for dry skin.  The patient denies any new symptoms such as chest discomfort, neck or arm discomfort. There has been no new shortness of breath, PND or orthopnea. There have been no reported palpitations, presyncope or syncope.   Studies Reviewed:    EKG:   EKG Interpretation Date/Time:  Wednesday November 07 2023 09:18:15 EST Ventricular Rate:  64 PR Interval:  184 QRS Duration:  72 QT Interval:  388 QTC Calculation: 400 R Axis:   -62  Text Interpretation: Normal sinus rhythm Low voltage QRS Left anterior fascicular block Confirmed by Rollene Rotunda ([id number]) on 11/07/2023 9:54:40 AM     Risk Assessment/Calculations:              Physical Exam:   VS:  BP 110/60   Pulse 64   Ht 5\' 5"  (1.651 m)   Wt 132 lb (59.9 kg)   BMI 21.97 kg/m    Wt Readings from Last 3 Encounters:  11/07/23 132 lb (59.9 kg)  11/01/23 131 lb (59.4 kg)  09/12/23 130 lb (59 kg)     GEN: Well nourished, well developed in no acute distress NECK: No JVD; No carotid bruits CARDIAC: RRR, no murmurs, rubs, gallops RESPIRATORY:  Clear to auscultation without rales, wheezing or rhonchi  ABDOMEN: Soft, non-tender, non-distended EXTREMITIES:  No edema; No deformity   ASSESSMENT AND PLAN:   Palpitations: The patient is having no  symptoms.  No change in therapy.  No further workup at this point.     Follow up with me in six months.   Signed, Rollene Rotunda, MD

## 2023-11-07 ENCOUNTER — Ambulatory Visit (INDEPENDENT_AMBULATORY_CARE_PROVIDER_SITE_OTHER): Payer: Medicare Other | Admitting: Cardiology

## 2023-11-07 ENCOUNTER — Encounter: Payer: Self-pay | Admitting: Cardiology

## 2023-11-07 VITALS — BP 110/60 | HR 64 | Ht 65.0 in | Wt 132.0 lb

## 2023-11-07 DIAGNOSIS — R002 Palpitations: Secondary | ICD-10-CM

## 2023-11-07 NOTE — Patient Instructions (Signed)
Medication Instructions:  The current medical regimen is effective;  continue present plan and medications.  *If you need a refill on your cardiac medications before your next appointment, please call your pharmacy*  Follow-Up: At [facility], you and your health needs are our priority.  As part of our continuing mission to provide you with exceptional heart care, we have created designated Provider Care Teams.  These Care Teams include your primary Cardiologist (physician) and Advanced Practice Providers (APPs -  Physician Assistants and Nurse Practitioners) who all work together to provide you with the care you need, when you need it.  We recommend signing up for the patient portal called "MyChart".  Sign up information is provided on this After Visit Summary.  MyChart is used to connect with patients for Virtual Visits (Telemedicine).  Patients are able to view lab/test results, encounter notes, upcoming appointments, etc.  Non-urgent messages can be sent to your provider as well.   To learn more about what you can do with MyChart, go to [email or web address].    Your next appointment:   6 month(s)  Provider:   James Hochrein, MD    

## 2023-11-13 ENCOUNTER — Ambulatory Visit (INDEPENDENT_AMBULATORY_CARE_PROVIDER_SITE_OTHER): Payer: Medicare Other

## 2023-11-13 DIAGNOSIS — E538 Deficiency of other specified B group vitamins: Secondary | ICD-10-CM | POA: Diagnosis not present

## 2023-11-13 NOTE — Progress Notes (Signed)
Cyanocobalamin injection given to left deltoid.  Patient tolerated well.

## 2023-11-19 ENCOUNTER — Other Ambulatory Visit: Payer: Self-pay | Admitting: Family

## 2023-11-21 DIAGNOSIS — L821 Other seborrheic keratosis: Secondary | ICD-10-CM | POA: Diagnosis not present

## 2023-11-21 DIAGNOSIS — L820 Inflamed seborrheic keratosis: Secondary | ICD-10-CM | POA: Diagnosis not present

## 2023-11-21 DIAGNOSIS — L218 Other seborrheic dermatitis: Secondary | ICD-10-CM | POA: Diagnosis not present

## 2023-11-21 DIAGNOSIS — L84 Corns and callosities: Secondary | ICD-10-CM | POA: Diagnosis not present

## 2023-11-23 DIAGNOSIS — H608X3 Other otitis externa, bilateral: Secondary | ICD-10-CM | POA: Diagnosis not present

## 2023-11-23 DIAGNOSIS — H6123 Impacted cerumen, bilateral: Secondary | ICD-10-CM | POA: Diagnosis not present

## 2023-11-26 DIAGNOSIS — H04121 Dry eye syndrome of right lacrimal gland: Secondary | ICD-10-CM | POA: Diagnosis not present

## 2023-11-26 DIAGNOSIS — Z961 Presence of intraocular lens: Secondary | ICD-10-CM | POA: Diagnosis not present

## 2023-12-03 ENCOUNTER — Telehealth: Payer: Self-pay

## 2023-12-04 ENCOUNTER — Other Ambulatory Visit: Payer: Self-pay | Admitting: Family Medicine

## 2023-12-04 DIAGNOSIS — F419 Anxiety disorder, unspecified: Secondary | ICD-10-CM

## 2023-12-04 DIAGNOSIS — F4323 Adjustment disorder with mixed anxiety and depressed mood: Secondary | ICD-10-CM

## 2023-12-04 NOTE — Telephone Encounter (Signed)
Created in error

## 2023-12-05 ENCOUNTER — Telehealth: Payer: Self-pay

## 2023-12-05 DIAGNOSIS — F4323 Adjustment disorder with mixed anxiety and depressed mood: Secondary | ICD-10-CM

## 2023-12-05 DIAGNOSIS — F419 Anxiety disorder, unspecified: Secondary | ICD-10-CM

## 2023-12-05 MED ORDER — ALPRAZOLAM 0.25 MG PO TABS: 0.2500 mg | ORAL_TABLET | Freq: Every day | ORAL | 0 refills | Status: AC | PRN

## 2023-12-05 NOTE — Telephone Encounter (Signed)
Sent 1 month

## 2023-12-05 NOTE — Telephone Encounter (Signed)
Pt states that she does have 2 refills left on the bottle but the Rx has expired. Pt confirms that she takes 1/2 tab about every 2-3 weeks. That is why she has not had to refill since originally prescribed. Would like Rx sent [facility].

## 2023-12-05 NOTE — Telephone Encounter (Signed)
Copied from CRM [id number]. Topic: Clinical - Medication Question >> Dec 05, 2023 11:45 AM Desma Mcgregor wrote: Reason for CRM: Patient looking to speak with Dr. Louanne Skye about ALPRAZolam Prudy Feeler) 0.25 MG tablet. Please contact patient back [phone]

## 2023-12-05 NOTE — Addendum Note (Signed)
Addended by: Arville Care on: 12/05/2023 04:08 PM   Modules accepted: Orders

## 2023-12-17 ENCOUNTER — Ambulatory Visit: Payer: Medicare Other

## 2023-12-21 ENCOUNTER — Ambulatory Visit (INDEPENDENT_AMBULATORY_CARE_PROVIDER_SITE_OTHER): Payer: Medicare Other | Admitting: *Deleted

## 2023-12-21 DIAGNOSIS — E538 Deficiency of other specified B group vitamins: Secondary | ICD-10-CM | POA: Diagnosis not present

## 2023-12-21 NOTE — Progress Notes (Signed)
 Patient in today for monthly B 12 injection. Tolerated well.

## 2023-12-22 ENCOUNTER — Other Ambulatory Visit: Payer: Self-pay | Admitting: Family Medicine

## 2023-12-24 NOTE — Telephone Encounter (Signed)
  To pharmacy: Rose Martinez SAYS SHE IS NOW USING BID. NEED NEW RX WITH CORRECTED DIRECTIONS FOR INSURANCE

## 2023-12-24 NOTE — Telephone Encounter (Signed)
 Sent in prescription for fluticasone twice daily.

## 2024-01-22 ENCOUNTER — Ambulatory Visit: Payer: Medicare Other

## 2024-01-23 ENCOUNTER — Ambulatory Visit (INDEPENDENT_AMBULATORY_CARE_PROVIDER_SITE_OTHER): Payer: Medicare Other

## 2024-01-23 DIAGNOSIS — E538 Deficiency of other specified B group vitamins: Secondary | ICD-10-CM | POA: Diagnosis not present

## 2024-02-19 ENCOUNTER — Ambulatory Visit (INDEPENDENT_AMBULATORY_CARE_PROVIDER_SITE_OTHER): Payer: Medicare Other

## 2024-02-19 DIAGNOSIS — E538 Deficiency of other specified B group vitamins: Secondary | ICD-10-CM | POA: Diagnosis not present

## 2024-02-19 NOTE — Progress Notes (Signed)
 Patient is in office today for a nurse visit for B12 Injection. Patient Injection was given in the  Right deltoid. Patient tolerated injection well.

## 2024-03-25 ENCOUNTER — Ambulatory Visit (INDEPENDENT_AMBULATORY_CARE_PROVIDER_SITE_OTHER)

## 2024-03-25 DIAGNOSIS — E538 Deficiency of other specified B group vitamins: Secondary | ICD-10-CM | POA: Diagnosis not present

## 2024-03-25 NOTE — Progress Notes (Signed)
 Patient is in office today for a nurse visit for B12 Injection. Patient Injection was given in the  Left deltoid. Patient tolerated injection well.

## 2024-04-24 DIAGNOSIS — L218 Other seborrheic dermatitis: Secondary | ICD-10-CM | POA: Diagnosis not present

## 2024-04-24 DIAGNOSIS — L820 Inflamed seborrheic keratosis: Secondary | ICD-10-CM | POA: Diagnosis not present

## 2024-04-24 DIAGNOSIS — D225 Melanocytic nevi of trunk: Secondary | ICD-10-CM | POA: Diagnosis not present

## 2024-04-24 DIAGNOSIS — D692 Other nonthrombocytopenic purpura: Secondary | ICD-10-CM | POA: Diagnosis not present

## 2024-04-24 DIAGNOSIS — D1801 Hemangioma of skin and subcutaneous tissue: Secondary | ICD-10-CM | POA: Diagnosis not present

## 2024-04-24 DIAGNOSIS — L84 Corns and callosities: Secondary | ICD-10-CM | POA: Diagnosis not present

## 2024-04-24 DIAGNOSIS — L821 Other seborrheic keratosis: Secondary | ICD-10-CM | POA: Diagnosis not present

## 2024-04-25 ENCOUNTER — Ambulatory Visit (INDEPENDENT_AMBULATORY_CARE_PROVIDER_SITE_OTHER)

## 2024-04-25 DIAGNOSIS — E538 Deficiency of other specified B group vitamins: Secondary | ICD-10-CM | POA: Diagnosis not present

## 2024-04-25 NOTE — Progress Notes (Signed)
 Patient is in office today for a nurse visit for B12 Injection. Patient Injection was given in the  Right deltoid. Patient tolerated injection well.

## 2024-05-01 ENCOUNTER — Encounter: Payer: Self-pay | Admitting: Family Medicine

## 2024-05-01 ENCOUNTER — Ambulatory Visit (INDEPENDENT_AMBULATORY_CARE_PROVIDER_SITE_OTHER): Payer: Medicare Other | Admitting: Family Medicine

## 2024-05-01 VITALS — BP 121/68 | HR 63 | Ht 65.0 in | Wt 132.0 lb

## 2024-05-01 DIAGNOSIS — E782 Mixed hyperlipidemia: Secondary | ICD-10-CM | POA: Diagnosis not present

## 2024-05-01 DIAGNOSIS — F4323 Adjustment disorder with mixed anxiety and depressed mood: Secondary | ICD-10-CM | POA: Diagnosis not present

## 2024-05-01 DIAGNOSIS — F419 Anxiety disorder, unspecified: Secondary | ICD-10-CM | POA: Diagnosis not present

## 2024-05-01 DIAGNOSIS — E538 Deficiency of other specified B group vitamins: Secondary | ICD-10-CM

## 2024-05-01 DIAGNOSIS — E559 Vitamin D deficiency, unspecified: Secondary | ICD-10-CM

## 2024-05-01 DIAGNOSIS — M810 Age-related osteoporosis without current pathological fracture: Secondary | ICD-10-CM | POA: Diagnosis not present

## 2024-05-01 LAB — LIPID PANEL

## 2024-05-01 MED ORDER — ROSUVASTATIN CALCIUM 5 MG PO TABS: 5.0000 mg | ORAL_TABLET | ORAL | 3 refills | Status: DC

## 2024-05-01 MED ORDER — MIRABEGRON ER 50 MG PO TB24: 50.0000 mg | ORAL_TABLET | Freq: Every day | ORAL | 11 refills | Status: AC

## 2024-05-01 NOTE — Progress Notes (Signed)
 BP 121/68   Pulse 63   Ht 5\' 5"  (1.651 m)   Wt 132 lb (59.9 kg)   SpO2 99%   BMI 21.97 kg/m    Subjective:   Patient ID: Rose Martinez, female    DOB: March 02, 1935, 89 y.o.   MRN: [id number]  HPI: Rose Martinez is a 89 y.o. female presenting on 05/01/2024 for Medical Management of Chronic Issues and Hyperlipidemia   HPI Osteoporosis/osteopenia Fractures or history of fracture: None Medication: Calcium  and vitamin D . Duration of treatment: Few years Last bone density scan: Has not had 1 that we have record of Last T score:   Hyperlipidemia Patient is coming in for recheck of his hyperlipidemia. The patient is currently taking rosuvastatin . They deny any issues with myalgias or history of liver damage from it. They deny any focal numbness or weakness or chest pain.   B12 deficiency vitamin D  deficiency recheck Patient is coming in today for recheck of her vitamin levels.  Patient's biggest complaint is that she feels like she gets fatigue when it is getting closer to time to get another B12 injection.  She has been fighting this off and on over the past few years.  She also feels like she gets fatigued and her legs at different times but not all of the time.  Relevant past medical, surgical, family and social history reviewed and updated as indicated. Interim medical history since our last visit reviewed. Allergies and medications reviewed and updated.  Review of Systems  Constitutional:  Positive for fatigue. Negative for chills and fever.  HENT:  Negative for congestion, ear discharge and ear pain.   Eyes:  Negative for redness and visual disturbance.  Respiratory:  Negative for chest tightness and shortness of breath.   Cardiovascular:  Negative for chest pain and leg swelling.  Genitourinary:  Negative for difficulty urinating and dysuria.  Musculoskeletal:  Positive for arthralgias. Negative for back pain and gait problem.  Skin:  Negative for rash.  Neurological:   Positive for weakness. Negative for dizziness, light-headedness and headaches.  Psychiatric/Behavioral:  Negative for agitation and behavioral problems.   All other systems reviewed and are negative.   Per HPI unless specifically indicated above   Allergies as of 05/01/2024       Reactions   Statins    myalgias   Cephalexin Other (See Comments)   Pt does not remember   Penicillins Other (See Comments)   Pt's mother was severly allergic, so she has never taken PCN.        Medication List        Accurate as of May 01, 2024  9:29 AM. If you have any questions, ask your nurse or doctor.          acetaminophen  650 MG CR tablet Commonly known as: TYLENOL  Take 650 mg by mouth every 8 (eight) hours as needed for pain.   ALIGN PO Take 1 capsule by mouth daily.   ALPRAZolam  0.25 MG tablet Commonly known as: XANAX  Take 1 tablet (0.25 mg total) by mouth daily as needed for anxiety.   betamethasone (augmented) 0.05 % lotion Commonly known as: DIPROLENE Apply 1 application  topically daily.   betamethasone dipropionate 0.05 % lotion Apply topically as needed.   cholecalciferol 1000 units tablet Commonly known as: VITAMIN D  Take 2,000 Units by mouth daily.   Ciprodex OTIC suspension Generic drug: ciprofloxacin -dexamethasone Place 4 drops into the left ear in the morning, at noon, in the evening, and  at bedtime.   fluticasone  50 MCG/ACT nasal spray Commonly known as: FLONASE  Place 1 spray into both nostrils 2 (two) times daily as needed for allergies or rhinitis.   guaiFENesin  600 MG 12 hr tablet Commonly known as: MUCINEX  Take 600 mg by mouth daily.   Hydrocortisone Butyrate 0.1 % Lotn Apply 1 application topically as needed.   loratadine 10 MG tablet Commonly known as: CLARITIN Take 10 mg by mouth daily as needed.   mirabegron  ER 50 MG Tb24 tablet Commonly known as: Myrbetriq  Take 1 tablet (50 mg total) by mouth daily.   mometasone 0.1 % lotion Commonly  known as: ELOCON Apply topically daily.   montelukast  10 MG tablet Commonly known as: SINGULAIR  TAKE (1) TABLET DAILY AS DIRECTED.   mupirocin ointment 2 % Commonly known as: BACTROBAN Apply 1 application  topically 2 (two) times daily. to affected area(s)   propranolol  20 MG tablet Commonly known as: INDERAL  Take 1 tablet (20 mg total) by mouth daily as needed.   rosuvastatin  5 MG tablet Commonly known as: CRESTOR  Take 1 tablet (5 mg total) by mouth every other day.   sertraline  50 MG tablet Commonly known as: ZOLOFT  Take 1 tablet (50 mg total) by mouth daily.         Objective:   BP 121/68   Pulse 63   Ht 5\' 5"  (1.651 m)   Wt 132 lb (59.9 kg)   SpO2 99%   BMI 21.97 kg/m   Wt Readings from Last 3 Encounters:  05/01/24 132 lb (59.9 kg)  11/07/23 132 lb (59.9 kg)  11/01/23 131 lb (59.4 kg)    Physical Exam Vitals and nursing note reviewed.  Constitutional:      General: She is not in acute distress.    Appearance: She is well-developed. She is not diaphoretic.  Eyes:     Conjunctiva/sclera: Conjunctivae normal.  Cardiovascular:     Rate and Rhythm: Normal rate and regular rhythm.     Heart sounds: Normal heart sounds. No murmur heard. Pulmonary:     Effort: Pulmonary effort is normal. No respiratory distress.     Breath sounds: Normal breath sounds. No wheezing.  Musculoskeletal:        General: No swelling.  Skin:    General: Skin is warm and dry.     Findings: No rash.  Neurological:     Mental Status: She is alert and oriented to person, place, and time.     Coordination: Coordination normal.  Psychiatric:        Behavior: Behavior normal.       Assessment & Plan:   Problem List Items Addressed This Visit       Musculoskeletal and Integument   Osteoporosis   Relevant Orders   CMP14+EGFR   VITAMIN D  25 Hydroxy (Vit-D Deficiency, Fractures)     Other   Adjustment disorder with mixed anxiety and depressed mood   Vitamin D  deficiency    Relevant Orders   VITAMIN D  25 Hydroxy (Vit-D Deficiency, Fractures)   Hyperlipidemia - Primary   Relevant Medications   rosuvastatin  (CRESTOR ) 5 MG tablet   Other Relevant Orders   CMP14+EGFR   Lipid panel   B12 deficiency   Relevant Orders   CBC with Differential/Platelet   CMP14+EGFR   Vitamin B12   Other Visit Diagnoses       Anxiety           Continue current medicine.  She says she does not need the sertraline   anymore and we will go ahead and stop that. Follow up plan: Return in about 6 months (around 11/01/2024), or if symptoms worsen or fail to improve, for Hyperlipidemia and B12 recheck.  Counseling provided for all of the vaccine components Orders Placed This Encounter  Procedures   CBC with Differential/Platelet   CMP14+EGFR   Lipid panel   Vitamin B12   VITAMIN D  25 Hydroxy (Vit-D Deficiency, Fractures)    Jolyne Needs, MD [facility] 05/01/2024, 9:29 AM

## 2024-05-02 LAB — CMP14+EGFR
ALT: 11 IU/L (ref 0–32)
AST: 13 IU/L (ref 0–40)
Albumin: 4.6 g/dL (ref 3.7–4.7)
Alkaline Phosphatase: 84 IU/L (ref 44–121)
BUN/Creatinine Ratio: 23 (ref 12–28)
BUN: 16 mg/dL (ref 8–27)
Bilirubin Total: 0.4 mg/dL (ref 0.0–1.2)
CO2: 24 mmol/L (ref 20–29)
Calcium: 9.8 mg/dL (ref 8.7–10.3)
Chloride: 104 mmol/L (ref 96–106)
Creatinine, Ser: 0.69 mg/dL (ref 0.57–1.00)
Globulin, Total: 2.2 g/dL (ref 1.5–4.5)
Glucose: 96 mg/dL (ref 70–99)
Potassium: 4.7 mmol/L (ref 3.5–5.2)
Sodium: 143 mmol/L (ref 134–144)
Total Protein: 6.8 g/dL (ref 6.0–8.5)
eGFR: 84 mL/min/{1.73_m2} (ref >59)

## 2024-05-02 LAB — CBC WITH DIFFERENTIAL/PLATELET
Basophils Absolute: 0 10*3/uL (ref 0.0–0.2)
Basos: 1 %
EOS (ABSOLUTE): 0.1 10*3/uL (ref 0.0–0.4)
Eos: 1 %
Hematocrit: 41.1 % (ref 34.0–46.6)
Hemoglobin: 13.3 g/dL (ref 11.1–15.9)
Immature Grans (Abs): 0 10*3/uL (ref 0.0–0.1)
Immature Granulocytes: 0 %
Lymphocytes Absolute: 1.5 10*3/uL (ref 0.7–3.1)
Lymphs: 25 %
MCH: 32.8 pg (ref 26.6–33.0)
MCHC: 32.4 g/dL (ref 31.5–35.7)
MCV: 102 fL — ABNORMAL HIGH (ref 79–97)
Monocytes Absolute: 0.5 10*3/uL (ref 0.1–0.9)
Monocytes: 8 %
Neutrophils Absolute: 4 10*3/uL (ref 1.4–7.0)
Neutrophils: 65 %
Platelets: 173 10*3/uL (ref 150–450)
RBC: 4.05 x10E6/uL (ref 3.77–5.28)
RDW: 11.8 % (ref 11.7–15.4)
WBC: 6 10*3/uL (ref 3.4–10.8)

## 2024-05-02 LAB — LIPID PANEL
Cholesterol, Total: 177 mg/dL (ref 100–199)
HDL: 67 mg/dL (ref >39)
LDL CALC COMMENT:: 2.6 ratio (ref 0.0–4.4)
LDL Chol Calc (NIH): 95 mg/dL (ref 0–99)
Triglycerides: 81 mg/dL (ref 0–149)
VLDL Cholesterol Cal: 15 mg/dL (ref 5–40)

## 2024-05-02 LAB — VITAMIN D 25 HYDROXY (VIT D DEFICIENCY, FRACTURES): Vit D, 25-Hydroxy: 67.3 ng/mL (ref 30.0–100.0)

## 2024-05-02 LAB — VITAMIN B12: Vitamin B-12: 1340 pg/mL — ABNORMAL HIGH (ref 232–1245)

## 2024-05-05 ENCOUNTER — Ambulatory Visit: Payer: Self-pay | Admitting: Family Medicine

## 2024-05-05 NOTE — Progress Notes (Signed)
   Cardiology Office Note:   Date:  05/07/2024  ID:  Rose Martinez, DOB 12-01-1935, MRN [id number] PCP: Dettinger, Lucio Sabin, MD  [facility] Providers Cardiologist:  None {  History of Present Illness:   Rose Martinez is a 89 y.o. female who presents for followup of palpitations.  She has not required any Inderal  in quite a while.  She does complain of being fatigued.  She has leg weakness.  She wears compression stockings.  She says the only time she has noticed that she has felt well as when she skipped her Crestor .  She has done that several times and has had good days.  When she takes the Crestor  she feels poorly the next day.  She still lives by herself and tries to take care of her home but just does not have the energy that she used to have.  She is not having any presyncope or syncope.  She is not describing chest pressure, neck or arm discomfort.  She has had no weight gain or edema.   ROS: As stated in the HPI and negative for all other systems.  Studies Reviewed:    EKG:   NA   Risk Assessment/Calculations:              Physical Exam:   VS:  BP 132/70   Pulse 64   Ht 5\' 5"  (1.651 m)   Wt 132 lb (59.9 kg)   SpO2 99%   BMI 21.97 kg/m    Wt Readings from Last 3 Encounters:  05/07/24 132 lb (59.9 kg)  05/01/24 132 lb (59.9 kg)  11/07/23 132 lb (59.9 kg)     GEN: Well nourished, well developed in no acute distress NECK: No JVD; left  carotid bruits CARDIAC: RRR, no murmurs, rubs, gallops RESPIRATORY:  Clear to auscultation without rales, wheezing or rhonchi  ABDOMEN: Soft, non-tender, non-distended EXTREMITIES:  No edema; No deformity   ASSESSMENT AND PLAN:   Palpitations: She is optically bothered by these.  No change in therapy.  Bruit: I will order carotid Doppler.    Fatigue: She is going to stop the Crestor .  She will get a lipid profile in 3 months.   Dyslipidemia: She has had a calcium  score of 0 a decade ago and would not want to repeat this  and I do not think it is probably useful at her age.  Goals of therapy will be based on the carotid Doppler and follow-up lipids  Follow up with me in 6 months at the patient's request  Signed, Eilleen Grates, MD

## 2024-05-07 ENCOUNTER — Encounter: Payer: Self-pay | Admitting: Cardiology

## 2024-05-07 ENCOUNTER — Other Ambulatory Visit: Payer: Self-pay | Admitting: *Deleted

## 2024-05-07 ENCOUNTER — Ambulatory Visit (INDEPENDENT_AMBULATORY_CARE_PROVIDER_SITE_OTHER): Payer: BLUE CROSS/BLUE SHIELD | Admitting: Cardiology

## 2024-05-07 VITALS — BP 132/70 | HR 64 | Ht 65.0 in | Wt 132.0 lb

## 2024-05-07 DIAGNOSIS — R0989 Other specified symptoms and signs involving the circulatory and respiratory systems: Secondary | ICD-10-CM | POA: Diagnosis not present

## 2024-05-07 DIAGNOSIS — R002 Palpitations: Secondary | ICD-10-CM | POA: Diagnosis not present

## 2024-05-07 DIAGNOSIS — E785 Hyperlipidemia, unspecified: Secondary | ICD-10-CM

## 2024-05-07 NOTE — Patient Instructions (Signed)
 Medication Instructions:  Please discontinue Crestor . Continue all other medications as listed.  *If you need a refill on your cardiac medications before your next appointment, please call your pharmacy*  Lab Work: Lipid in 3 months at [facility].   If you have labs (blood work) drawn today and your tests are completely normal, you will receive your results only by: MyChart Message (if you have MyChart) OR A paper copy in the mail If you have any lab test that is abnormal or we need to change your treatment, we will call you to review the results.  Testing/Procedures: Your physician has requested that you have a carotid duplex. This test is an ultrasound of the carotid arteries in your neck. It looks at blood flow through these arteries that supply the brain with blood. Allow one hour for this exam. There are no restrictions or special instructions. You will be contacted to be scheduled for this testing which will be completed at [facility].  Follow-Up: At [facility], you and your health needs are our priority.  As part of our continuing mission to provide you with exceptional heart care, our providers are all part of one team.  This team includes your primary Cardiologist (physician) and Advanced Practice Providers or APPs (Physician Assistants and Nurse Practitioners) who all work together to provide you with the care you need, when you need it.  Your next appointment:   6 month(s)  Provider:   Eilleen Grates, MD    We recommend signing up for the patient portal called "MyChart".  Sign up information is provided on this After Visit Summary.  MyChart is used to connect with patients for Virtual Visits (Telemedicine).  Patients are able to view lab/test results, encounter notes, upcoming appointments, etc.  Non-urgent messages can be sent to your provider as well.   To learn more about what you can do with MyChart, go to [email or web address].

## 2024-05-08 ENCOUNTER — Telehealth: Payer: Self-pay | Admitting: Cardiology

## 2024-05-08 ENCOUNTER — Encounter: Payer: Self-pay | Admitting: Cardiology

## 2024-05-08 NOTE — Telephone Encounter (Signed)
 Will send this message high priority to our pre-cert dept and Lyndel Sanes, for further assistance.

## 2024-05-08 NOTE — Telephone Encounter (Signed)
 Checking percert on the following patient for testing scheduled at [facility].    CARTOID STUDY 05/20/2024

## 2024-05-08 NOTE — Telephone Encounter (Signed)
 I see you are doing Pre-Cert on the Carotid pt is calling and does not want to go to [facility]. She wants it done at [facility]

## 2024-05-20 ENCOUNTER — Ambulatory Visit (HOSPITAL_COMMUNITY)

## 2024-05-23 ENCOUNTER — Ambulatory Visit (HOSPITAL_COMMUNITY)
Admission: RE | Admit: 2024-05-23 | Discharge: 2024-05-23 | Disposition: A | Discharge status: Home / Self Care | Source: Ambulatory Visit | Attending: Vascular Surgery | Admitting: Vascular Surgery

## 2024-05-23 DIAGNOSIS — R0989 Other specified symptoms and signs involving the circulatory and respiratory systems: Secondary | ICD-10-CM | POA: Diagnosis not present

## 2024-05-24 ENCOUNTER — Ambulatory Visit: Payer: Self-pay | Admitting: Cardiology

## 2024-05-26 ENCOUNTER — Telehealth: Payer: Self-pay

## 2024-05-26 ENCOUNTER — Telehealth: Payer: Self-pay | Admitting: Family Medicine

## 2024-05-26 ENCOUNTER — Other Ambulatory Visit: Payer: Self-pay

## 2024-05-26 DIAGNOSIS — E538 Deficiency of other specified B group vitamins: Secondary | ICD-10-CM

## 2024-05-26 NOTE — Telephone Encounter (Signed)
 Confirmed with pt B12 nurse visit and lab appt scheduled for 05/27/24.

## 2024-05-26 NOTE — Telephone Encounter (Signed)
 Copied from CRM [id number]. Topic: General - Other >> May 26, 2024 11:42 AM Turkey B wrote: Reason for CRM: pt called in asking to speak with Dr Dettinger's nurse, wouldn't say what about

## 2024-05-26 NOTE — Telephone Encounter (Signed)
 Pt would like for B12 level to be checked prior to receiving B12 injection tomorrow. Future orders placed and lab appt made for 2:30 6/10 and nurse visit is at 3pm

## 2024-05-26 NOTE — Telephone Encounter (Signed)
 Copied from CRM [id number]. Topic: General - Other >> May 26, 2024 11:42 AM Turkey B wrote: Reason for CRM: pt called in asking to speak with Dr Dettinger's nurse, wouldn't say what about >> May 26, 2024  2:48 PM Ivette P wrote: Pt called in to follow up on phone call, did not want to disclose details would like a call from primary or dettigners nurse

## 2024-05-27 ENCOUNTER — Ambulatory Visit (INDEPENDENT_AMBULATORY_CARE_PROVIDER_SITE_OTHER)

## 2024-05-27 ENCOUNTER — Other Ambulatory Visit

## 2024-05-27 DIAGNOSIS — E538 Deficiency of other specified B group vitamins: Secondary | ICD-10-CM | POA: Diagnosis not present

## 2024-05-27 NOTE — Progress Notes (Signed)
 Patient is in office today for a nurse visit for B12 Injection. Patient Injection was given in the  Left deltoid. Patient tolerated injection well.

## 2024-05-28 ENCOUNTER — Ambulatory Visit: Payer: Self-pay | Admitting: Family Medicine

## 2024-05-28 LAB — VITAMIN B12: Vitamin B-12: 694 pg/mL (ref 232–1245)

## 2024-06-04 DIAGNOSIS — Z961 Presence of intraocular lens: Secondary | ICD-10-CM | POA: Diagnosis not present

## 2024-06-04 DIAGNOSIS — H04121 Dry eye syndrome of right lacrimal gland: Secondary | ICD-10-CM | POA: Diagnosis not present

## 2024-06-27 ENCOUNTER — Ambulatory Visit (INDEPENDENT_AMBULATORY_CARE_PROVIDER_SITE_OTHER): Admitting: *Deleted

## 2024-06-27 ENCOUNTER — Ambulatory Visit

## 2024-06-27 DIAGNOSIS — E538 Deficiency of other specified B group vitamins: Secondary | ICD-10-CM

## 2024-06-27 NOTE — Progress Notes (Signed)
 Patient is in office today for a nurse visit for B12 Injection. Patient Injection was given in the  Right deltoid. Patient tolerated injection well.

## 2024-07-03 DIAGNOSIS — Z889 Allergy status to unspecified drugs, medicaments and biological substances status: Secondary | ICD-10-CM | POA: Diagnosis not present

## 2024-07-03 DIAGNOSIS — H6123 Impacted cerumen, bilateral: Secondary | ICD-10-CM | POA: Diagnosis not present

## 2024-07-03 DIAGNOSIS — H608X3 Other otitis externa, bilateral: Secondary | ICD-10-CM | POA: Diagnosis not present

## 2024-07-03 DIAGNOSIS — H8110 Benign paroxysmal vertigo, unspecified ear: Secondary | ICD-10-CM | POA: Diagnosis not present

## 2024-07-03 DIAGNOSIS — R2689 Other abnormalities of gait and mobility: Secondary | ICD-10-CM | POA: Diagnosis not present

## 2024-07-07 ENCOUNTER — Ambulatory Visit: Payer: Self-pay | Admitting: Family Medicine

## 2024-07-07 ENCOUNTER — Telehealth: Payer: Self-pay | Admitting: Family Medicine

## 2024-07-07 ENCOUNTER — Ambulatory Visit (HOSPITAL_COMMUNITY)
Admission: RE | Admit: 2024-07-07 | Discharge: 2024-07-07 | Disposition: A | Discharge status: Home / Self Care | Source: Ambulatory Visit | Attending: Family Medicine | Admitting: Family Medicine

## 2024-07-07 ENCOUNTER — Encounter: Payer: Self-pay | Admitting: Family Medicine

## 2024-07-07 ENCOUNTER — Ambulatory Visit (INDEPENDENT_AMBULATORY_CARE_PROVIDER_SITE_OTHER): Admitting: Family Medicine

## 2024-07-07 VITALS — BP 110/68 | HR 72 | Temp 98.2°F | Ht 65.0 in | Wt 133.0 lb

## 2024-07-07 DIAGNOSIS — I82811 Embolism and thrombosis of superficial veins of right lower extremities: Secondary | ICD-10-CM | POA: Diagnosis not present

## 2024-07-07 DIAGNOSIS — M79604 Pain in right leg: Secondary | ICD-10-CM | POA: Diagnosis not present

## 2024-07-07 DIAGNOSIS — M7989 Other specified soft tissue disorders: Secondary | ICD-10-CM | POA: Insufficient documentation

## 2024-07-07 DIAGNOSIS — I8391 Asymptomatic varicose veins of right lower extremity: Secondary | ICD-10-CM | POA: Diagnosis not present

## 2024-07-07 MED ORDER — APIXABAN 2.5 MG PO TABS
2.5000 mg | ORAL_TABLET | Freq: Two times a day (BID) | ORAL | 0 refills | Status: DC
Start: 2024-07-07 — End: 2024-10-22

## 2024-07-07 NOTE — Progress Notes (Signed)
 Subjective: CC: Leg pain PCP: Dettinger, Fonda LABOR, MD YEP:[id number] Rose Martinez is a 89 y.o. female presenting to clinic today for:  1.  Right leg pain Patient reports sudden onset of right sided leg pain/knots that are new behind her knee and into her calf.  She noticed this on Saturday around 8 PM.  Denies any preceding injury.  No reports of chest pain or shortness of breath.    ROS: Per HPI  Allergies  Allergen Reactions   Statins     myalgias   Cephalexin Other (See Comments)    Pt does not remember    Penicillins Other (See Comments)    Pt's mother was severly allergic, so she has never taken PCN.   Past Medical History:  Diagnosis Date   Anxiety    Hyperlipidemia    Mild   Osteoporosis    Palpitations     Current Outpatient Medications:    acetaminophen  (TYLENOL ) 650 MG CR tablet, Take 650 mg by mouth every 8 (eight) hours as needed for pain., Disp: , Rfl:    ALPRAZolam  (XANAX ) 0.25 MG tablet, Take 1 tablet (0.25 mg total) by mouth daily as needed for anxiety., Disp: 30 tablet, Rfl: 0   betamethasone dipropionate 0.05 % lotion, Apply topically as needed., Disp: , Rfl:    betamethasone, augmented, (DIPROLENE) 0.05 % lotion, Apply 1 application  topically daily., Disp: , Rfl:    cholecalciferol (VITAMIN D ) 1000 UNITS tablet, Take 2,000 Units by mouth daily. , Disp: , Rfl:    CIPRODEX OTIC suspension, Place 4 drops into the left ear in the morning, at noon, in the evening, and at bedtime., Disp: , Rfl:    fluticasone  (FLONASE ) 50 MCG/ACT nasal spray, Place 1 spray into both nostrils 2 (two) times daily as needed for allergies or rhinitis., Disp: 16 g, Rfl: 11   guaiFENesin  (MUCINEX ) 600 MG 12 hr tablet, Take 600 mg by mouth daily., Disp: , Rfl:    Hydrocortisone Butyrate 0.1 % LOTN, Apply 1 application topically as needed., Disp: , Rfl:    loratadine (CLARITIN) 10 MG tablet, Take 10 mg by mouth daily as needed., Disp: , Rfl:    mirabegron  ER (MYRBETRIQ ) 50 MG TB24 tablet,  Take 1 tablet (50 mg total) by mouth daily., Disp: 30 tablet, Rfl: 11   mometasone (ELOCON) 0.1 % lotion, Apply topically daily., Disp: , Rfl:    montelukast  (SINGULAIR ) 10 MG tablet, TAKE (1) TABLET DAILY AS DIRECTED., Disp: 30 tablet, Rfl: 0   mupirocin ointment (BACTROBAN) 2 %, Apply 1 application  topically 2 (two) times daily. to affected area(s), Disp: , Rfl:    Probiotic Product (ALIGN PO), Take 1 capsule by mouth daily., Disp: , Rfl:    propranolol  (INDERAL ) 20 MG tablet, Take 1 tablet (20 mg total) by mouth daily as needed., Disp: 90 tablet, Rfl: 3   sertraline  (ZOLOFT ) 50 MG tablet, Take 1 tablet (50 mg total) by mouth daily., Disp: 90 tablet, Rfl: 3  Current Facility-Administered Medications:    cyanocobalamin  (VITAMIN B12) injection 1,000 mcg, 1,000 mcg, Intramuscular, Q30 days, Dettinger, Fonda LABOR, MD, 1,000 mcg at 06/27/24 0950 Social History   Socioeconomic History   Marital status: Divorced    Spouse name: Not on file   Number of children: 1   Years of education: Not on file   Highest education level: Not on file  Occupational History   Not on file  Tobacco Use   Smoking status: Former    Current packs/day:  0.00    Types: Cigarettes    Quit date: 03/27/1957    Years since quitting: 67.3   Smokeless tobacco: Never  Vaping Use   Vaping status: Never Used  Substance and Sexual Activity   Alcohol use: No   Drug use: No   Sexual activity: Not on file  Other Topics Concern   Not on file  Social History Narrative   Her one child and 2 grandchildren live in [place] .      Right handed   Social Drivers of Health   Financial Resource Strain: Not on file  Food Insecurity: Low Risk  (07/03/2024)   Received from [facility]   Hunger Vital Sign    Within the past 12 months, you worried that your food would run out before you got money to buy more: Never true    Within the past 12 months, the food you bought just didn't last and you didn't have money to get more. :  Never true  Transportation Needs: No Transportation Needs (07/03/2024)   Received from [facility]    In the past 12 months, has lack of reliable transportation kept you from medical appointments, meetings, work or from getting things needed for daily living? : No  Physical Activity: Not on file  Stress: Not on file  Social Connections: Not on file  Intimate Partner Violence: Not on file   Family History  Problem Relation Age of Onset   Heart attack Father    Other Mother        ployarthritis Nodosa    Objective: Office vital signs reviewed. BP 110/68   Pulse 72   Temp 98.2 F (36.8 C)   Ht 5' 5 (1.651 m)   Wt 133 lb (60.3 kg)   SpO2 96%   BMI 22.13 kg/m   Physical Examination:  General: Awake, alert, well nourished, No acute distress HEENT: sclera white, MMM Cardio: regular rate and rhythm  MSK: She has palpable warmth with what appears to be a tortuous vein along the medial posterior aspect of the right knee and calf.  This is tender to palpation.  Assessment/ Plan: 89 y.o. female   Pain and swelling of right lower extremity - Plan: US  Venous Img Lower Unilateral Right  Stat venous ultrasound ordered to rule out superficial versus deep venous thrombosis.  Further interventions pending results   Rose CHRISTELLA Fielding, DO [facility] [phone]

## 2024-07-07 NOTE — Telephone Encounter (Unsigned)
 Copied from CRM [id number]. Topic: Clinical - Medical Advice >> Jul 07, 2024 11:12 AM Carlatta H wrote: Reason for CRM: Ultrasound [facility] imaging impression 2// Superficial thrombosis of right greater saphenous vein and branch varicosites in the distal thigh and right lower leg//

## 2024-07-10 ENCOUNTER — Ambulatory Visit: Payer: Self-pay

## 2024-07-10 NOTE — Telephone Encounter (Signed)
 Fyi patient also has blood clots what are your recommendations?

## 2024-07-10 NOTE — Telephone Encounter (Signed)
 Patient aware and verbalized understanding.

## 2024-07-10 NOTE — Telephone Encounter (Signed)
 Compression stockings and warm compresses is likely what will help her with the superficial thrombosis and varicose veins.  These are not the same risk as deep vein thrombosis because the deep veins can break off and go elsewhere like the lungs.  The superficial veins cannot do that because there are valves blocking them.  Mainly I would focus on warm compresses and compression stockings.

## 2024-07-10 NOTE — Telephone Encounter (Signed)
 Copied from CRM #[id number]. Topic: Clinical - Medical Advice >> Jul 10, 2024 10:06 AM Tiffany S wrote: Reason for CRM: Patient has questions about her veins patient has varicose veins patient stated her veins are warm and wanted to know if she should wear compression hose

## 2024-07-29 ENCOUNTER — Ambulatory Visit: Admitting: *Deleted

## 2024-07-29 DIAGNOSIS — E538 Deficiency of other specified B group vitamins: Secondary | ICD-10-CM | POA: Diagnosis not present

## 2024-07-29 NOTE — Progress Notes (Signed)
 Patient is in office today for a nurse visit for B12 Injection. Patient Injection was given in the  Left deltoid. Patient tolerated injection well.

## 2024-08-07 ENCOUNTER — Encounter: Payer: Self-pay | Admitting: Family Medicine

## 2024-08-07 ENCOUNTER — Ambulatory Visit (INDEPENDENT_AMBULATORY_CARE_PROVIDER_SITE_OTHER): Admitting: Family Medicine

## 2024-08-07 ENCOUNTER — Ambulatory Visit: Payer: Self-pay | Admitting: Family Medicine

## 2024-08-07 ENCOUNTER — Other Ambulatory Visit

## 2024-08-07 VITALS — BP 110/65 | HR 58 | Temp 97.5°F | Ht 65.0 in | Wt 133.0 lb

## 2024-08-07 DIAGNOSIS — R2689 Other abnormalities of gait and mobility: Secondary | ICD-10-CM

## 2024-08-07 DIAGNOSIS — R519 Headache, unspecified: Secondary | ICD-10-CM | POA: Diagnosis not present

## 2024-08-07 DIAGNOSIS — E785 Hyperlipidemia, unspecified: Secondary | ICD-10-CM | POA: Diagnosis not present

## 2024-08-07 DIAGNOSIS — R531 Weakness: Secondary | ICD-10-CM

## 2024-08-07 LAB — URINALYSIS, COMPLETE
Bilirubin, UA: NEGATIVE
Glucose, UA: NEGATIVE
Ketones, UA: NEGATIVE
Leukocytes,UA: NEGATIVE
Nitrite, UA: NEGATIVE
Protein,UA: NEGATIVE
RBC, UA: NEGATIVE
Specific Gravity, UA: 1.015 (ref 1.005–1.030)
Urobilinogen, Ur: 0.2 mg/dL (ref 0.2–1.0)
pH, UA: 7.5 (ref 5.0–7.5)

## 2024-08-07 NOTE — Patient Instructions (Signed)
 Chronic balance disturbance and intermittent weakness Chronic balance disturbance with intermittent weakness, possibly related to sinus issues, vertigo, or dehydration. - Place neurology referral for evaluation of vertigo and dizziness. - Encourage hydration. - Reduce caffeine intake.  Nocturia and overactive bladder symptoms Nocturia and overactive bladder symptoms persist despite Myrbetriq  use, indicating possible ineffectiveness. - Stop Myrbetriq . - Monitor symptoms over the next few weeks.  Varicose veins of lower extremity Varicose veins present with lumps in the lower extremity. Previous evaluation ruled out deep vein thrombosis. Further evaluation needed for ongoing management. - Review previous report regarding varicose veins and blood thinner use.  Intermittent peripheral neuropathy symptoms Intermittent peripheral neuropathy symptoms possibly related to past B12 deficiency. B12 levels are currently adequate. - Continue B12 supplementation.  Possible dehydration Possible dehydration contributing to symptoms of weakness and fatigue. Improvement noted after receiving fluids in the past. - Check urine for signs of dehydration.  Allergic rhinitis Allergic rhinitis with symptoms possibly contributing to head pressure and sinus issues. - Restart loratadine once daily.

## 2024-08-07 NOTE — Progress Notes (Signed)
 BP 110/65   Pulse (!) 58   Ht 5' 5 (1.651 m)   Wt 133 lb (60.3 kg)   BMI 22.13 kg/m    Subjective:   Patient ID: Rose Martinez, female    DOB: [birth year], 89 y.o.   MRN: [id number]  HPI: Rose Martinez is a 89 y.o. female presenting on 08/07/2024 for Dizziness and Fatigue   Discussed the use of AI scribe software for clinical note transcription with the patient, who gave verbal consent to proceed.  History of Present Illness   Rose Martinez is an 89 year old female who presents with persistent head pressure and weakness.  She experiences persistent head pressure, described as a heavy feeling across her forehead, similar to needing a new glasses prescription. This sensation has not resolved as it usually does. No lightheadedness is reported, but she describes the feeling as a kind of pressure.  She reports significant weakness, feeling as though her balance is off, likening it to being intoxicated despite not drinking alcohol. She mentions difficulty walking and needing to be cautious to avoid falling. Her legs sometimes feel weak when climbing stairs, though this is inconsistent.  She has been taking Myrbetriq  for bladder issues but reports no improvement, as she still wakes up three to four times a night to urinate. She sometimes forgets to take the medication, but notes no difference in symptoms whether she takes it or not. Her current medications include montelukast , vitamin D , and Flonase . She has reduced her use of loratadine and Mucinex , now only taking montelukast . No worsening sinus or allergy symptoms, though she wonders if her head symptoms could be allergy-related.  She mentions a history of varicose veins and was previously evaluated for deep vein thrombosis. She was on Eliquis  but stopped it on August 29th. She still notices lumps in her legs but is unsure if they are a concern.  She describes intermittent neuropathy symptoms in her feet, such as a sensation  of walking on a mattress and occasional burning in her toes. She is on B12 supplements, which she believes have helped in the past, though her recent injection did not provide relief.  Her diet includes a breakfast of cereal with fruit, Pedialyte, orange juice, and decaf coffee. She drinks bottled water throughout the day and uses caffeine for energy, though she is concerned about dehydration. She recalls feeling significantly better after receiving fluids in the past when dehydrated.  No recent sinus problems, congestion, or postnasal drainage. No kidney pain and denies any sinus tenderness upon palpation. She confirms feeling touch equally on both sides of her body and face.          Relevant past medical, surgical, family and social history reviewed and updated as indicated. Interim medical history since our last visit reviewed. Allergies and medications reviewed and updated.  Review of Systems  Constitutional:  Positive for fatigue. Negative for chills and fever.  HENT:  Negative for congestion, ear discharge and ear pain.   Eyes:  Negative for redness and visual disturbance.  Respiratory:  Negative for cough, chest tightness, shortness of breath and wheezing.   Cardiovascular:  Negative for chest pain and leg swelling.  Genitourinary:  Negative for difficulty urinating, dysuria, flank pain and frequency.  Musculoskeletal:  Positive for gait problem. Negative for back pain.  Skin:  Negative for rash.  Neurological:  Positive for dizziness, weakness and light-headedness. Negative for headaches.  Psychiatric/Behavioral:  Negative for agitation and behavioral problems.  All other systems reviewed and are negative.   Per HPI unless specifically indicated above   Allergies as of 08/07/2024       Reactions   Statins    myalgias   Cephalexin Other (See Comments)   Pt does not remember   Penicillins Other (See Comments)   Pt's mother was severly allergic, so she has never taken PCN.         Medication List        Accurate as of August 07, 2024  9:40 AM. If you have any questions, ask your nurse or doctor.          acetaminophen  650 MG CR tablet Commonly known as: TYLENOL  Take 650 mg by mouth every 8 (eight) hours as needed for pain.   ALIGN PO Take 1 capsule by mouth daily.   ALPRAZolam  0.25 MG tablet Commonly known as: XANAX  Take 1 tablet (0.25 mg total) by mouth daily as needed for anxiety.   apixaban  2.5 MG Tabs tablet Commonly known as: Eliquis  Take 1 tablet (2.5 mg total) by mouth 2 (two) times daily.   betamethasone (augmented) 0.05 % lotion Commonly known as: DIPROLENE Apply 1 application  topically daily.   betamethasone dipropionate 0.05 % lotion Apply topically as needed.   cholecalciferol 1000 units tablet Commonly known as: VITAMIN D  Take 2,000 Units by mouth daily.   Ciprodex OTIC suspension Generic drug: ciprofloxacin -dexamethasone Place 4 drops into the left ear in the morning, at noon, in the evening, and at bedtime.   fluticasone  50 MCG/ACT nasal spray Commonly known as: FLONASE  Place 1 spray into both nostrils 2 (two) times daily as needed for allergies or rhinitis.   guaiFENesin  600 MG 12 hr tablet Commonly known as: MUCINEX  Take 600 mg by mouth daily.   Hydrocortisone Butyrate 0.1 % Lotn Apply 1 application topically as needed.   loratadine 10 MG tablet Commonly known as: CLARITIN Take 10 mg by mouth daily as needed.   mirabegron  ER 50 MG Tb24 tablet Commonly known as: Myrbetriq  Take 1 tablet (50 mg total) by mouth daily.   mometasone 0.1 % lotion Commonly known as: ELOCON Apply topically daily.   montelukast  10 MG tablet Commonly known as: SINGULAIR  TAKE (1) TABLET DAILY AS DIRECTED.   mupirocin ointment 2 % Commonly known as: BACTROBAN Apply 1 application  topically 2 (two) times daily. to affected area(s)   propranolol  20 MG tablet Commonly known as: INDERAL  Take 1 tablet (20 mg total) by mouth  daily as needed.   sertraline  50 MG tablet Commonly known as: ZOLOFT  Take 1 tablet (50 mg total) by mouth daily.         Objective:   BP 110/65   Pulse (!) 58   Ht 5' 5 (1.651 m)   Wt 133 lb (60.3 kg)   BMI 22.13 kg/m   Wt Readings from Last 3 Encounters:  08/07/24 133 lb (60.3 kg)  07/07/24 133 lb (60.3 kg)  05/07/24 132 lb (59.9 kg)    Physical Exam Physical Exam   HEENT: Throat without signs of infection. No sinus tenderness. NECK: Thyroid  normal. CHEST: Lungs clear to auscultation bilaterally. CARDIOVASCULAR: Regular heart rate and rhythm, no murmurs. ABDOMEN: No costovertebral angle tenderness. NEUROLOGICAL: Symmetric strength in upper and lower extremities. Sensation intact and symmetric in upper and lower extremities. Pupils equal, round, reactive to light, extraocular movements intact. Facial, hypoglossal, and accessory nerve functions intact.         Assessment & Plan:   Problem List  Items Addressed This Visit   None Visit Diagnoses       Balance problem    -  Primary   Relevant Orders   Ambulatory referral to Neurology   Urinalysis, Complete     Pressure in head       Relevant Orders   Ambulatory referral to Neurology   Urinalysis, Complete     Weakness generalized       Relevant Orders   Ambulatory referral to Neurology   Urinalysis, Complete         Chronic balance disturbance and intermittent weakness Chronic balance disturbance with intermittent weakness, possibly related to sinus issues, vertigo, or dehydration. - Place neurology referral for evaluation of vertigo and dizziness. - Encourage hydration. - Reduce caffeine intake.  Nocturia and overactive bladder symptoms Nocturia and overactive bladder symptoms persist despite Myrbetriq  use, indicating possible ineffectiveness. - Stop Myrbetriq . - Monitor symptoms over the next few weeks.  Varicose veins of lower extremity Varicose veins present with lumps in the lower extremity.  Previous evaluation ruled out deep vein thrombosis. Further evaluation needed for ongoing management. - Review previous report regarding varicose veins and blood thinner use.  Intermittent peripheral neuropathy symptoms Intermittent peripheral neuropathy symptoms possibly related to past B12 deficiency. B12 levels are currently adequate. - Continue B12 supplementation.  Possible dehydration Possible dehydration contributing to symptoms of weakness and fatigue. Improvement noted after receiving fluids in the past. - Check urine for signs of dehydration.  Allergic rhinitis Allergic rhinitis with symptoms possibly contributing to head pressure and sinus issues. - Restart loratadine once daily. - Stop Mucinex .      Follow up plan: Return if symptoms worsen or fail to improve.  Counseling provided for all of the vaccine components No orders of the defined types were placed in this encounter.   Fonda Levins, MD [facility] 08/07/2024, 9:40 AM

## 2024-08-08 LAB — LIPID PANEL
Chol/HDL Ratio: 3.9 ratio (ref 0.0–4.4)
Cholesterol, Total: 230 mg/dL — ABNORMAL HIGH (ref 100–199)
HDL: 59 mg/dL (ref >39)
LDL Chol Calc (NIH): 154 mg/dL — ABNORMAL HIGH (ref 0–99)
Triglycerides: 94 mg/dL (ref 0–149)
VLDL Cholesterol Cal: 17 mg/dL (ref 5–40)

## 2024-08-19 ENCOUNTER — Telehealth: Payer: Self-pay | Admitting: Family Medicine

## 2024-08-19 NOTE — Telephone Encounter (Signed)
 Copied from CRM [id number]. Topic: Appointments - Scheduling Inquiry for Clinic >> Aug 19, 2024 10:37 AM Donna BRAVO wrote: Reason for CRM:  Patient calling to schedule appt for various veins look at her legs and cholesterol  patient refused to answer decision tree questions,  first available appt is October, patient is demanding to have nurse call her back to schedule appt     ----------------------------------------------------------------------- From previous Reason for Contact - Scheduling: Patient/patient representative is calling to schedule an appointment. Refer to attachments for appointment information.

## 2024-08-19 NOTE — Telephone Encounter (Signed)
 Contacted patient, appointment made 09/05 with Dr. Maryanne

## 2024-08-22 ENCOUNTER — Ambulatory Visit

## 2024-08-27 ENCOUNTER — Encounter: Payer: Self-pay | Admitting: Family Medicine

## 2024-08-27 ENCOUNTER — Ambulatory Visit (INDEPENDENT_AMBULATORY_CARE_PROVIDER_SITE_OTHER): Admitting: Family Medicine

## 2024-08-27 VITALS — BP 121/71 | HR 58 | Temp 91.3°F | Ht 65.0 in | Wt 134.0 lb

## 2024-08-27 DIAGNOSIS — E782 Mixed hyperlipidemia: Secondary | ICD-10-CM | POA: Diagnosis not present

## 2024-08-27 DIAGNOSIS — N3281 Overactive bladder: Secondary | ICD-10-CM | POA: Diagnosis not present

## 2024-08-27 DIAGNOSIS — I82811 Embolism and thrombosis of superficial veins of right lower extremities: Secondary | ICD-10-CM

## 2024-08-27 MED ORDER — EZETIMIBE 10 MG PO TABS: 10.0000 mg | ORAL_TABLET | Freq: Every day | ORAL | 3 refills | Status: AC

## 2024-08-27 NOTE — Progress Notes (Signed)
 BP 121/71   Pulse (!) 58   Temp (!) 91.3 F (32.9 C)   Ht 5' 5 (1.651 m)   Wt 134 lb (60.8 kg)   SpO2 96%   BMI 22.30 kg/m    Subjective:   Patient ID: Rose Martinez, female    DOB: 1935/06/29, 89 y.o.   MRN: [id number]  HPI: Rose Martinez is a 89 y.o. female presenting on 08/27/2024 for History of DVT (Has finished blood thinner. Wants legs checked as a precaution) and Urinary Frequency (Discuss Myrbetriq )   Discussed the use of AI scribe software for clinical note transcription with the patient, who gave verbal consent to proceed.  History of Present Illness   Rose Martinez Rose Martinez is an 89 year old female who presents for follow-up on her blood thinner cessation and bladder symptoms.  She stopped her blood thinner a week ago, which was initially prescribed for three months due to superficial thrombophlebitis related to varicose veins. She wears compression stockings, with a knee-high on one leg and a thigh-high on the other, to manage her varicose veins. She experiences occasional sensations in her legs that resolve quickly. She remains active, frequently walking, which she believes helps prevent clots.  From March to August, she experienced a heavy feeling in her head, which she suspects may be related to weather changes and blooming plants. She takes loratadine and montelukast  for these symptoms and has stopped taking Mucinex . Her symptoms have improved since the end of August.  She has a history of overactive bladder and is currently taking Myrbetriq , which helps reduce dribbling and urgency. She urinates about three times a night and limits fluid intake after 3 PM. She enjoys foods high in water content, such as lettuce and celery, which may contribute to urinary frequency. No stress incontinence, but she notes urgency and occasional dribbling before reaching the bathroom.  She has high cholesterol and was previously taking Crestor . She discussed with her cardiologist  about possibly switching to a non-statin medication but decided to continue with Crestor . She is unsure of the name of the alternative medication suggested by her cardiologist.  She is concerned about her stomach appearing larger despite being very active. She drinks a lot of liquids and feels full after eating. She has regular bowel movements with no constipation or straining. She is concerned about weight gain and occasionally drinks Ensure for taste, not for nutritional supplementation.          Relevant past medical, surgical, family and social history reviewed and updated as indicated. Interim medical history since our last visit reviewed. Allergies and medications reviewed and updated.  Review of Systems  Constitutional:  Negative for chills and fever.  Eyes:  Negative for visual disturbance.  Respiratory:  Negative for chest tightness and shortness of breath.   Cardiovascular:  Negative for chest pain and leg swelling.  Genitourinary:  Negative for difficulty urinating and dysuria.  Musculoskeletal:  Negative for back pain and gait problem.  Skin:  Negative for color change, rash and wound.  Neurological:  Negative for dizziness, light-headedness and headaches.  Psychiatric/Behavioral:  Negative for agitation and behavioral problems.   All other systems reviewed and are negative.   Per HPI unless specifically indicated above   Allergies as of 08/27/2024       Reactions   Statins    myalgias   Cephalexin Other (See Comments)   Pt does not remember   Penicillins Other (See Comments)   Pt's mother  was severly allergic, so she has never taken PCN.        Medication List        Accurate as of August 27, 2024  5:05 PM. If you have any questions, ask your nurse or doctor.          acetaminophen  650 MG CR tablet Commonly known as: TYLENOL  Take 650 mg by mouth every 8 (eight) hours as needed for pain.   ALIGN PO Take 1 capsule by mouth daily.   ALPRAZolam  0.25  MG tablet Commonly known as: XANAX  Take 1 tablet (0.25 mg total) by mouth daily as needed for anxiety.   apixaban  2.5 MG Tabs tablet Commonly known as: Eliquis  Take 1 tablet (2.5 mg total) by mouth 2 (two) times daily.   betamethasone (augmented) 0.05 % lotion Commonly known as: DIPROLENE Apply 1 application  topically daily.   betamethasone dipropionate 0.05 % lotion Apply topically as needed.   cholecalciferol 1000 units tablet Commonly known as: VITAMIN D  Take 2,000 Units by mouth daily.   Ciprodex OTIC suspension Generic drug: ciprofloxacin -dexamethasone Place 4 drops into the left ear in the morning, at noon, in the evening, and at bedtime.   ezetimibe  10 MG tablet Commonly known as: Zetia  Take 1 tablet (10 mg total) by mouth daily. Started by: Fonda LABOR Zalia Hautala   fluticasone  50 MCG/ACT nasal spray Commonly known as: FLONASE  Place 1 spray into both nostrils 2 (two) times daily as needed for allergies or rhinitis.   guaiFENesin  600 MG 12 hr tablet Commonly known as: MUCINEX  Take 600 mg by mouth daily.   Hydrocortisone Butyrate 0.1 % Lotn Apply 1 application topically as needed.   loratadine 10 MG tablet Commonly known as: CLARITIN Take 10 mg by mouth daily as needed.   mirabegron  ER 50 MG Tb24 tablet Commonly known as: Myrbetriq  Take 1 tablet (50 mg total) by mouth daily.   mometasone 0.1 % lotion Commonly known as: ELOCON Apply topically daily.   montelukast  10 MG tablet Commonly known as: SINGULAIR  TAKE (1) TABLET DAILY AS DIRECTED.   mupirocin ointment 2 % Commonly known as: BACTROBAN Apply 1 application  topically 2 (two) times daily. to affected area(s)   propranolol  20 MG tablet Commonly known as: INDERAL  Take 1 tablet (20 mg total) by mouth daily as needed.   sertraline  50 MG tablet Commonly known as: ZOLOFT  Take 1 tablet (50 mg total) by mouth daily.         Objective:   BP 121/71   Pulse (!) 58   Temp (!) 91.3 F (32.9 C)    Ht 5' 5 (1.651 m)   Wt 134 lb (60.8 kg)   SpO2 96%   BMI 22.30 kg/m   Wt Readings from Last 3 Encounters:  08/27/24 134 lb (60.8 kg)  08/07/24 133 lb (60.3 kg)  07/07/24 133 lb (60.3 kg)    Physical Exam Physical Exam   VITALS: P- 58, BP- 58/ CARDIOVASCULAR: Pulse normal ABDOMEN: Abdomen soft, no abnormalities palpated EXTREMITIES: No significant swelling in extremities         Assessment & Plan:   Problem List Items Addressed This Visit       Genitourinary   OAB (overactive bladder)     Other   Hyperlipidemia   Relevant Medications   ezetimibe  (ZETIA ) 10 MG tablet   Other Visit Diagnoses       Superficial thrombosis of leg, right    -  Primary   Relevant Medications   ezetimibe  (ZETIA )  10 MG tablet          Overactive bladder with urge incontinence Chronic overactive bladder with urgency and occasional dribbling. Myrbetriq  effective in reducing symptoms. - Continue Myrbetriq  50 mg daily. - Educated on pelvic floor exercises and considered vaginal weights for strengthening. - Consider referral to urologist or gynecologist if symptoms worsen.  Superficial thrombophlebitis of right lower extremity and varicose veins of lower extremities Superficial thrombophlebitis in right lower extremity likely related to varicose veins. Asymptomatic with compression stockings. - Continue use of compression stockings. - Encourage regular physical activity to prevent clot formation.  Hyperlipidemia Hyperlipidemia with previous Crestor  intolerance. After cardiology discussion, she resumed Crestor  to assess tolerance. - Review cardiology notes to confirm alternative medication recommendation. - Start Zetia          Follow up plan: Return if symptoms worsen or fail to improve.  Counseling provided for all of the vaccine components No orders of the defined types were placed in this encounter.   Fonda Levins, MD [facility] 08/27/2024, 5:05  PM

## 2024-08-29 ENCOUNTER — Ambulatory Visit

## 2024-08-29 DIAGNOSIS — E538 Deficiency of other specified B group vitamins: Secondary | ICD-10-CM | POA: Diagnosis not present

## 2024-08-29 NOTE — Progress Notes (Signed)
 Patient is in office today for a nurse visit for B12 Injection. Patient Injection was given in the  Right deltoid. Patient tolerated injection well.

## 2024-09-17 ENCOUNTER — Encounter: Payer: Self-pay | Admitting: Neurology

## 2024-09-29 ENCOUNTER — Ambulatory Visit (INDEPENDENT_AMBULATORY_CARE_PROVIDER_SITE_OTHER): Admitting: *Deleted

## 2024-09-29 DIAGNOSIS — E538 Deficiency of other specified B group vitamins: Secondary | ICD-10-CM | POA: Diagnosis not present

## 2024-09-29 MED ORDER — CYANOCOBALAMIN 1000 MCG/ML IJ SOLN
1000.0000 ug | INTRAMUSCULAR | Status: AC
  Administered 2024-09-29 – 2024-12-25 (×3): 1000 ug via INTRAMUSCULAR

## 2024-09-29 NOTE — Progress Notes (Signed)
 Patient is in office today for a nurse visit for B12 Injection. Patient Injection was given in the  Left deltoid. Patient tolerated injection well.

## 2024-10-19 NOTE — Progress Notes (Unsigned)
  Cardiology Office Note:   Date:  10/22/2024  ID:  Rose Martinez, DOB 02-24-1935, MRN [id number] PCP: Dettinger, Fonda LABOR, MD  [facility] Providers Cardiologist:  Lynwood Schilling, MD {  History of Present Illness:   Rose Martinez is a 89 y.o. female who presents for followup of palpitations.  At the last visit she had fatigue and held her Crestor .  She feels better since then.  The patient denies any new symptoms such as chest discomfort, neck or arm discomfort. There has been no new shortness of breath, PND or orthopnea. There have been no reported palpitations, presyncope or syncope.   She had varicose veins and is now wearing compression stockings.  She was on Eliquis  for a little while because of this.  She is not taking Zetia  for her cholesterol and can have this checked next week.  She is walking her dog 4 times a day.  She denies any cardiovascular symptoms.  ROS: As stated in the HPI and negative for all other systems.  Studies Reviewed:    EKG:   EKG Interpretation Date/Time:  Wednesday October 22 2024 14:55:39 EST Ventricular Rate:  53 PR Interval:  178 QRS Duration:  70 QT Interval:  426 QTC Calculation: 399 R Axis:   -37  Text Interpretation: Sinus bradycardia Left axis deviation Poor anterior R wave progression When compared with ECG of 07-Nov-2023 09:18, No significant change was found Confirmed by Schilling Lynwood ([id number]) on 10/22/2024 3:10:55 PM    Risk Assessment/Calculations:           Physical Exam:   VS:  Ht 5' 5 (1.651 m)   Wt 136 lb (61.7 kg)   BMI 22.63 kg/m    Wt Readings from Last 3 Encounters:  10/22/24 136 lb (61.7 kg)  08/27/24 134 lb (60.8 kg)  08/07/24 133 lb (60.3 kg)     GEN: Well nourished, well developed in no acute distress NECK: No JVD; No carotid bruits CARDIAC: RRR, no murmurs, rubs, gallops RESPIRATORY:  Clear to auscultation without rales, wheezing or rhonchi  ABDOMEN: Soft, non-tender, non-distended EXTREMITIES:  No  edema; No deformity   ASSESSMENT AND PLAN:       Palpitations: The patient is not particular bothered by these.  No change in therapy.  She says she has not had to take her beta-blocker in a couple of years.     Fatigue: This is improved.  She is drinking a little more caffeine.  Is not bothering her palpitations and it is helping her fatigue.  Dyslipidemia:   She is going to have her lipids checked next week.  Her LDL was at 154 in August.  She is tolerating Zetia .  Plan per Dettinger, Fonda LABOR, MD  Follow up with her in 6 months at her request.  Signed, Lynwood Schilling, MD

## 2024-10-22 ENCOUNTER — Encounter: Payer: Self-pay | Admitting: Cardiology

## 2024-10-22 ENCOUNTER — Ambulatory Visit (INDEPENDENT_AMBULATORY_CARE_PROVIDER_SITE_OTHER): Admitting: Cardiology

## 2024-10-22 VITALS — Ht 65.0 in | Wt 136.0 lb

## 2024-10-22 DIAGNOSIS — R5383 Other fatigue: Secondary | ICD-10-CM | POA: Diagnosis not present

## 2024-10-22 DIAGNOSIS — R002 Palpitations: Secondary | ICD-10-CM | POA: Diagnosis not present

## 2024-10-22 NOTE — Patient Instructions (Addendum)

## 2024-10-29 DIAGNOSIS — L84 Corns and callosities: Secondary | ICD-10-CM | POA: Diagnosis not present

## 2024-10-29 DIAGNOSIS — L218 Other seborrheic dermatitis: Secondary | ICD-10-CM | POA: Diagnosis not present

## 2024-10-29 DIAGNOSIS — D1801 Hemangioma of skin and subcutaneous tissue: Secondary | ICD-10-CM | POA: Diagnosis not present

## 2024-10-29 DIAGNOSIS — L821 Other seborrheic keratosis: Secondary | ICD-10-CM | POA: Diagnosis not present

## 2024-10-29 DIAGNOSIS — D225 Melanocytic nevi of trunk: Secondary | ICD-10-CM | POA: Diagnosis not present

## 2024-10-29 DIAGNOSIS — L820 Inflamed seborrheic keratosis: Secondary | ICD-10-CM | POA: Diagnosis not present

## 2024-10-30 ENCOUNTER — Encounter: Payer: Self-pay | Admitting: Family Medicine

## 2024-10-30 ENCOUNTER — Ambulatory Visit: Payer: Self-pay | Admitting: Family Medicine

## 2024-10-30 VITALS — BP 137/69 | HR 56 | Ht 65.0 in | Wt 134.0 lb

## 2024-10-30 DIAGNOSIS — E538 Deficiency of other specified B group vitamins: Secondary | ICD-10-CM

## 2024-10-30 DIAGNOSIS — I10 Essential (primary) hypertension: Secondary | ICD-10-CM

## 2024-10-30 DIAGNOSIS — E559 Vitamin D deficiency, unspecified: Secondary | ICD-10-CM

## 2024-10-30 DIAGNOSIS — E782 Mixed hyperlipidemia: Secondary | ICD-10-CM | POA: Diagnosis not present

## 2024-10-30 DIAGNOSIS — F4323 Adjustment disorder with mixed anxiety and depressed mood: Secondary | ICD-10-CM

## 2024-10-30 DIAGNOSIS — M810 Age-related osteoporosis without current pathological fracture: Secondary | ICD-10-CM

## 2024-10-30 NOTE — Progress Notes (Signed)
 BP 137/69   Pulse (!) 56   Ht 5' 5 (1.651 m)   Wt 134 lb (60.8 kg)   SpO2 99%   BMI 22.30 kg/m    Subjective:   Patient ID: Rose Martinez, female    DOB: 1935-10-04, 89 y.o.   MRN: [id number]  HPI: Rose Martinez is a 89 y.o. female presenting on 10/30/2024 for Medical Management of Chronic Issues, Hyperlipidemia, Hypertension, and Anxiety   Discussed the use of AI scribe software for clinical note transcription with the patient, who gave verbal consent to proceed.  History of Present Illness   The patient presents for a recheck of their cholesterol, bladder issues, and allergies.  Hyperlipidemia - Currently taking Zetia  for cholesterol management - No adverse effects from Zetia   Lower urinary tract symptoms - Taking Myrbetriq , which reduces urinary urgency - Persistent nocturia despite medication - Manages symptoms by limiting fluid intake before bedtime  Allergic rhinitis - Uses montelukast  or loratadine daily, alternating based on preference - Does not use Flonase  regularly - Experiences seasonal allergy symptoms, especially in spring, with sensation of head pressure - Symptoms have improved with colder weather  Anxiety symptoms - Has prescription for alprazolam , used infrequently (approximately once every three months, half a pill at a time) - Concerned about medication expiration - Not seeking a refill  Varicose veins - Varicose veins present, more pronounced in one leg - Uses support stockings for symptom management - Difficulty finding stockings with adequate support          Relevant past medical, surgical, family and social history reviewed and updated as indicated. Interim medical history since our last visit reviewed. Allergies and medications reviewed and updated.  Review of Systems  Constitutional:  Negative for chills and fever.  Eyes:  Negative for visual disturbance.  Respiratory:  Negative for chest tightness and shortness of breath.    Cardiovascular:  Negative for chest pain and leg swelling.  Genitourinary:  Negative for difficulty urinating and dysuria.  Musculoskeletal:  Negative for back pain and gait problem.  Skin:  Negative for rash.  Neurological:  Negative for dizziness, light-headedness and headaches.  Psychiatric/Behavioral:  Negative for agitation and behavioral problems.   All other systems reviewed and are negative.   Per HPI unless specifically indicated above   Allergies as of 10/30/2024       Reactions   Statins    myalgias   Cephalexin Other (See Comments)   Pt does not remember   Penicillins Other (See Comments)   Pt's mother was severly allergic, so she has never taken PCN.        Medication List        Accurate as of October 30, 2024  8:47 AM. If you have any questions, ask your nurse or doctor.          acetaminophen  650 MG CR tablet Commonly known as: TYLENOL  Take 650 mg by mouth every 8 (eight) hours as needed for pain.   ALIGN PO Take 1 capsule by mouth daily.   ALPRAZolam  0.25 MG tablet Commonly known as: XANAX  Take 1 tablet (0.25 mg total) by mouth daily as needed for anxiety.   betamethasone dipropionate 0.05 % lotion Apply topically as needed.   cholecalciferol 1000 units tablet Commonly known as: VITAMIN D  Take 2,000 Units by mouth daily.   Ciprodex OTIC suspension Generic drug: ciprofloxacin -dexamethasone Place 4 drops into the left ear in the morning, at noon, in the evening, and at bedtime. What changed:  when to take this reasons to take this   ezetimibe  10 MG tablet Commonly known as: Zetia  Take 1 tablet (10 mg total) by mouth daily.   fluticasone  50 MCG/ACT nasal spray Commonly known as: FLONASE  Place 1 spray into both nostrils 2 (two) times daily as needed for allergies or rhinitis.   guaiFENesin  600 MG 12 hr tablet Commonly known as: MUCINEX  Take 600 mg by mouth daily. What changed:  when to take this reasons to take this    Hydrocortisone Butyrate 0.1 % Lotn Apply 1 application topically as needed.   loratadine 10 MG tablet Commonly known as: CLARITIN Take 10 mg by mouth daily as needed.   mirabegron  ER 50 MG Tb24 tablet Commonly known as: Myrbetriq  Take 1 tablet (50 mg total) by mouth daily. What changed:  when to take this reasons to take this   montelukast  10 MG tablet Commonly known as: SINGULAIR  TAKE (1) TABLET DAILY AS DIRECTED. What changed:  how much to take how to take this when to take this reasons to take this   propranolol  20 MG tablet Commonly known as: INDERAL  Take 1 tablet (20 mg total) by mouth daily as needed.         Objective:   BP 137/69   Pulse (!) 56   Ht 5' 5 (1.651 m)   Wt 134 lb (60.8 kg)   SpO2 99%   BMI 22.30 kg/m   Wt Readings from Last 3 Encounters:  10/30/24 134 lb (60.8 kg)  10/22/24 136 lb (61.7 kg)  08/27/24 134 lb (60.8 kg)    Physical Exam Vitals and nursing note reviewed.  Constitutional:      General: She is not in acute distress.    Appearance: She is well-developed. She is not diaphoretic.  Eyes:     Conjunctiva/sclera: Conjunctivae normal.  Cardiovascular:     Rate and Rhythm: Normal rate and regular rhythm.     Heart sounds: Normal heart sounds. No murmur heard. Pulmonary:     Effort: Pulmonary effort is normal. No respiratory distress.     Breath sounds: Normal breath sounds. No wheezing.  Musculoskeletal:        General: No swelling or tenderness. Normal range of motion.  Skin:    General: Skin is warm and dry.     Findings: No rash.  Neurological:     Mental Status: She is alert and oriented to person, place, and time.     Coordination: Coordination normal.  Psychiatric:        Behavior: Behavior normal.    Physical Exam   CHEST: Lungs clear to auscultation bilaterally. CARDIOVASCULAR: Regular rate and rhythm, no murmurs. EXTREMITIES: No swelling in extremities. Pulses intact. Varicose veins palpable, no  abnormalities noted.         Assessment & Plan:   Problem List Items Addressed This Visit       Cardiovascular and Mediastinum   Essential hypertension   Relevant Orders   CBC With Diff/Platelet   CMP14+EGFR     Musculoskeletal and Integument   Osteoporosis     Other   Adjustment disorder with mixed anxiety and depressed mood   Vitamin D  deficiency   Relevant Orders   VITAMIN D  25 Hydroxy (Vit-D Deficiency, Fractures)   Hyperlipidemia - Primary   Relevant Orders   CMP14+EGFR   Lipid panel   B12 deficiency   Relevant Orders   Vitamin B12       Hyperlipidemia Managed with Zetia  without adverse effects. Cholesterol  levels believed to be improving. - Ordered blood work to check cholesterol levels.  Overactive bladder Myrbetriq  reduces urgency but not nocturia. Nocturia managed by limiting fluid intake. Myrbetriq  preferred due to fewer side effects.  Allergic rhinitis Symptoms include head pressure, likely seasonal. Montelukast  more effective than loratadine. - Start montelukast  daily towards the end of February and continue through mid-May. - Add loratadine one week before spring and continue through May.  Varicose veins of lower extremity Varicose veins more pronounced in one leg. Compression stockings provide some relief. - Continue wearing compression stockings.          Follow up plan: Return in about 6 months (around 04/29/2025), or if symptoms worsen or fail to improve, for Physical exam hypertension and hyperlipidemia.  Counseling provided for all of the vaccine components Orders Placed This Encounter  Procedures   CBC With Diff/Platelet   CMP14+EGFR   Lipid panel   VITAMIN D  25 Hydroxy (Vit-D Deficiency, Fractures)   Vitamin B12    Fonda Levins, MD [facility] 10/30/2024, 8:47 AM

## 2024-10-31 LAB — CBC WITH DIFF/PLATELET
Basophils Absolute: 0 x10E3/uL (ref 0.0–0.2)
Basos: 0 %
EOS (ABSOLUTE): 0.1 x10E3/uL (ref 0.0–0.4)
Eos: 1 %
Hematocrit: 41.9 % (ref 34.0–46.6)
Hemoglobin: 13.8 g/dL (ref 11.1–15.9)
Immature Grans (Abs): 0 x10E3/uL (ref 0.0–0.1)
Immature Granulocytes: 0 %
Lymphocytes Absolute: 1.2 x10E3/uL (ref 0.7–3.1)
Lymphs: 23 %
MCH: 32.9 pg (ref 26.6–33.0)
MCHC: 32.9 g/dL (ref 31.5–35.7)
MCV: 100 fL — ABNORMAL HIGH (ref 79–97)
Monocytes Absolute: 0.4 x10E3/uL (ref 0.1–0.9)
Monocytes: 7 %
Neutrophils Absolute: 3.5 x10E3/uL (ref 1.4–7.0)
Neutrophils: 69 %
Platelets: 194 x10E3/uL (ref 150–450)
RBC: 4.2 x10E6/uL (ref 3.77–5.28)
RDW: 11.7 % (ref 11.7–15.4)
WBC: 5.1 x10E3/uL (ref 3.4–10.8)

## 2024-10-31 LAB — LIPID PANEL
Chol/HDL Ratio: 3 ratio (ref 0.0–4.4)
Cholesterol, Total: 188 mg/dL (ref 100–199)
HDL: 63 mg/dL (ref >39)
LDL Chol Calc (NIH): 107 mg/dL — ABNORMAL HIGH (ref 0–99)
Triglycerides: 103 mg/dL (ref 0–149)
VLDL Cholesterol Cal: 18 mg/dL (ref 5–40)

## 2024-10-31 LAB — CMP14+EGFR
ALT: 13 IU/L (ref 0–32)
AST: 17 IU/L (ref 0–40)
Albumin: 4.4 g/dL (ref 3.7–4.7)
Alkaline Phosphatase: 88 IU/L (ref 48–129)
BUN/Creatinine Ratio: 18 (ref 12–28)
BUN: 12 mg/dL (ref 8–27)
Bilirubin Total: 0.3 mg/dL (ref 0.0–1.2)
CO2: 24 mmol/L (ref 20–29)
Calcium: 9.4 mg/dL (ref 8.7–10.3)
Chloride: 104 mmol/L (ref 96–106)
Creatinine, Ser: 0.65 mg/dL (ref 0.57–1.00)
Globulin, Total: 2.4 g/dL (ref 1.5–4.5)
Glucose: 86 mg/dL (ref 70–99)
Potassium: 4.2 mmol/L (ref 3.5–5.2)
Sodium: 141 mmol/L (ref 134–144)
Total Protein: 6.8 g/dL (ref 6.0–8.5)
eGFR: 85 mL/min/1.73 (ref >59)

## 2024-10-31 LAB — VITAMIN D 25 HYDROXY (VIT D DEFICIENCY, FRACTURES): Vit D, 25-Hydroxy: 63.1 ng/mL (ref 30.0–100.0)

## 2024-10-31 LAB — VITAMIN B12: Vitamin B-12: >2000 pg/mL — ABNORMAL HIGH (ref 232–1245)

## 2024-11-06 ENCOUNTER — Ambulatory Visit: Payer: Self-pay | Admitting: Family Medicine

## 2024-12-01 ENCOUNTER — Ambulatory Visit

## 2024-12-17 ENCOUNTER — Other Ambulatory Visit: Payer: Self-pay | Admitting: Family Medicine

## 2024-12-24 ENCOUNTER — Ambulatory Visit: Admitting: Neurology

## 2024-12-25 ENCOUNTER — Ambulatory Visit: Admitting: *Deleted

## 2024-12-25 DIAGNOSIS — E538 Deficiency of other specified B group vitamins: Secondary | ICD-10-CM | POA: Diagnosis not present

## 2024-12-25 NOTE — Progress Notes (Signed)
 Patient is in office today for a nurse visit for B12 Injection. Patient Injection was given in the  Left deltoid. Patient tolerated injection well.

## 2024-12-27 ENCOUNTER — Other Ambulatory Visit: Payer: Self-pay | Admitting: Family Medicine

## 2025-01-01 ENCOUNTER — Ambulatory Visit

## 2025-01-26 ENCOUNTER — Ambulatory Visit

## 2025-05-13 ENCOUNTER — Encounter: Admitting: Family Medicine
